# Patient Record
Sex: Female | Born: 1950 | Hispanic: Yes | Marital: Single | Smoking: Current every day smoker
Health system: Southern US, Community
[De-identification: ages and names within clinical notes are randomized; demographics above are authoritative.]

## PROBLEM LIST (undated history)

## (undated) ENCOUNTER — Ambulatory Visit (INDEPENDENT_AMBULATORY_CARE_PROVIDER_SITE_OTHER): Admission: RE | Payer: Self-pay | Admitting: Neurology

## (undated) ENCOUNTER — Ambulatory Visit (INDEPENDENT_AMBULATORY_CARE_PROVIDER_SITE_OTHER): Admission: RE | Payer: Self-pay

## (undated) DIAGNOSIS — R519 Headache, unspecified: Secondary | ICD-10-CM

## (undated) DIAGNOSIS — E785 Hyperlipidemia, unspecified: Secondary | ICD-10-CM

## (undated) DIAGNOSIS — R42 Dizziness and giddiness: Secondary | ICD-10-CM

## (undated) DIAGNOSIS — G629 Polyneuropathy, unspecified: Secondary | ICD-10-CM

## (undated) DIAGNOSIS — M545 Low back pain, unspecified: Secondary | ICD-10-CM

## (undated) DIAGNOSIS — R52 Pain, unspecified: Secondary | ICD-10-CM

## (undated) HISTORY — PX: CARPAL TUNNEL RELEASE: SHX101

## (undated) HISTORY — DX: Pain, unspecified: R52

## (undated) HISTORY — DX: Hyperlipidemia, unspecified: E78.5

## (undated) HISTORY — PX: APPENDECTOMY (OPEN): SHX54

## (undated) HISTORY — PX: FRACTURE SURGERY: SHX138

## (undated) HISTORY — PX: HYSTERECTOMY: SHX81

---
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
## 2002-07-24 ENCOUNTER — Ambulatory Visit
Admit: 2002-07-24 | Disposition: A | Discharge status: Home / Self Care | Payer: Self-pay | Source: Ambulatory Visit | Admitting: Neurological Surgery

## 2002-09-17 ENCOUNTER — Ambulatory Visit
Admit: 2002-09-17 | Disposition: A | Discharge status: Home / Self Care | Payer: Self-pay | Source: Ambulatory Visit | Admitting: Neurological Surgery

## 2003-04-26 HISTORY — PX: BACK SURGERY: SHX140

## 2003-05-23 ENCOUNTER — Ambulatory Visit
Admit: 2003-05-23 | Disposition: A | Discharge status: Home / Self Care | Payer: Self-pay | Source: Ambulatory Visit | Admitting: Specialist

## 2003-06-05 ENCOUNTER — Inpatient Hospital Stay
Admission: RE | Admit: 2003-06-05 | Disposition: A | Discharge status: Home / Self Care | Payer: Self-pay | Source: Ambulatory Visit | Admitting: Specialist

## 2003-09-10 ENCOUNTER — Ambulatory Visit
Admit: 2003-09-10 | Disposition: A | Discharge status: Home / Self Care | Payer: Self-pay | Source: Ambulatory Visit | Admitting: Physical Medicine & Rehabilitation

## 2003-09-22 ENCOUNTER — Emergency Department: Admit: 2003-09-22 | Payer: Self-pay | Source: Emergency Department | Admitting: Emergency Medicine

## 2004-01-16 ENCOUNTER — Ambulatory Visit
Admit: 2004-01-16 | Disposition: A | Discharge status: Home / Self Care | Payer: Self-pay | Source: Ambulatory Visit | Admitting: Physical Medicine & Rehabilitation

## 2004-03-11 ENCOUNTER — Ambulatory Visit
Admit: 2004-03-11 | Disposition: A | Discharge status: Home / Self Care | Payer: Self-pay | Source: Ambulatory Visit | Admitting: Infectious Disease

## 2005-02-10 ENCOUNTER — Ambulatory Visit: Admission: RE | Admit: 2005-02-10 | Payer: Self-pay | Source: Ambulatory Visit | Admitting: Orthopaedic Surgery

## 2009-08-18 ENCOUNTER — Emergency Department: Admit: 2009-08-18 | Payer: Self-pay | Source: Emergency Department | Admitting: Emergency Medicine

## 2010-05-20 ENCOUNTER — Ambulatory Visit: Admit: 2010-05-20 | Disposition: A | Discharge status: Home / Self Care | Payer: Self-pay | Source: Ambulatory Visit

## 2010-08-12 ENCOUNTER — Ambulatory Visit: Admit: 2010-08-12 | Discharge: 2010-08-12 | Payer: Self-pay | Source: Ambulatory Visit

## 2010-12-24 ENCOUNTER — Encounter (INDEPENDENT_AMBULATORY_CARE_PROVIDER_SITE_OTHER): Payer: Self-pay | Admitting: Neurology

## 2011-01-03 ENCOUNTER — Ambulatory Visit (INDEPENDENT_AMBULATORY_CARE_PROVIDER_SITE_OTHER): Payer: Medicare Other | Admitting: Neurology

## 2011-01-04 ENCOUNTER — Encounter (INDEPENDENT_AMBULATORY_CARE_PROVIDER_SITE_OTHER): Payer: Self-pay | Admitting: Neurology

## 2011-02-07 ENCOUNTER — Encounter (INDEPENDENT_AMBULATORY_CARE_PROVIDER_SITE_OTHER): Payer: Self-pay | Admitting: Neurology

## 2011-02-07 ENCOUNTER — Ambulatory Visit (INDEPENDENT_AMBULATORY_CARE_PROVIDER_SITE_OTHER): Payer: Medicare Other | Admitting: Neurology

## 2011-02-07 VITALS — BP 125/79 | HR 108 | Ht 61.0 in | Wt 90.2 lb

## 2011-02-07 DIAGNOSIS — M542 Cervicalgia: Secondary | ICD-10-CM

## 2011-02-07 DIAGNOSIS — R51 Headache: Secondary | ICD-10-CM

## 2011-02-07 DIAGNOSIS — R519 Headache, unspecified: Secondary | ICD-10-CM | POA: Insufficient documentation

## 2011-02-07 DIAGNOSIS — R413 Other amnesia: Secondary | ICD-10-CM

## 2011-02-07 DIAGNOSIS — Z8669 Personal history of other diseases of the nervous system and sense organs: Secondary | ICD-10-CM

## 2011-02-07 HISTORY — DX: Other amnesia: R41.3
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
## 2011-02-08 ENCOUNTER — Encounter (INDEPENDENT_AMBULATORY_CARE_PROVIDER_SITE_OTHER): Payer: Self-pay | Admitting: Neurology

## 2011-02-10 ENCOUNTER — Other Ambulatory Visit (INDEPENDENT_AMBULATORY_CARE_PROVIDER_SITE_OTHER): Payer: Self-pay | Admitting: Neurology

## 2011-02-11 LAB — LIPID PANEL, WITHOUT TOTAL CHOLESTEROL/HDL RATIO, SERUM
Cholesterol: 224 mg/dL — ABNORMAL HIGH (ref 100–199)
HDL: 66 mg/dL (ref >39)
LDL Calculated: 138 mg/dL — ABNORMAL HIGH (ref 0–99)
Triglycerides: 102 mg/dL (ref 0–149)
VLDL Calculated: 20 mg/dL (ref 5–40)

## 2011-04-25 ENCOUNTER — Ambulatory Visit
Admit: 2011-04-25 | Discharge: 2011-04-25 | Disposition: A | Discharge status: Home / Self Care | Payer: Self-pay | Source: Ambulatory Visit

## 2011-04-29 ENCOUNTER — Ambulatory Visit
Admit: 2011-04-29 | Discharge: 2011-04-29 | Disposition: A | Discharge status: Home / Self Care | Payer: Self-pay | Source: Ambulatory Visit

## 2011-09-22 ENCOUNTER — Encounter (INDEPENDENT_AMBULATORY_CARE_PROVIDER_SITE_OTHER): Payer: Self-pay | Admitting: Neurology

## 2011-09-22 ENCOUNTER — Ambulatory Visit (INDEPENDENT_AMBULATORY_CARE_PROVIDER_SITE_OTHER): Payer: Medicare Other | Admitting: Neurology

## 2011-09-22 VITALS — BP 144/79 | HR 87 | Wt 89.6 lb

## 2011-09-22 DIAGNOSIS — R51 Headache: Secondary | ICD-10-CM

## 2011-09-22 DIAGNOSIS — Z8669 Personal history of other diseases of the nervous system and sense organs: Secondary | ICD-10-CM
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
## 2011-10-06 ENCOUNTER — Ambulatory Visit (INDEPENDENT_AMBULATORY_CARE_PROVIDER_SITE_OTHER): Payer: Medicare Other | Admitting: Vascular Neurology

## 2011-10-06 ENCOUNTER — Encounter (INDEPENDENT_AMBULATORY_CARE_PROVIDER_SITE_OTHER): Payer: Self-pay | Admitting: Vascular Neurology

## 2011-10-06 VITALS — BP 106/68 | HR 72 | Ht 61.0 in | Wt 91.2 lb

## 2011-10-06 DIAGNOSIS — M542 Cervicalgia: Secondary | ICD-10-CM

## 2011-10-06 DIAGNOSIS — R51 Headache: Secondary | ICD-10-CM

## 2011-10-06 MED ORDER — GABAPENTIN 300 MG PO CAPS
ORAL_CAPSULE | ORAL | Status: DC
Start: 2011-10-06 — End: 2011-12-02
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
## 2011-12-02 ENCOUNTER — Ambulatory Visit (INDEPENDENT_AMBULATORY_CARE_PROVIDER_SITE_OTHER): Payer: Medicare Other | Admitting: Vascular Neurology

## 2011-12-02 ENCOUNTER — Encounter (INDEPENDENT_AMBULATORY_CARE_PROVIDER_SITE_OTHER): Payer: Self-pay | Admitting: Vascular Neurology

## 2011-12-02 VITALS — BP 99/65 | HR 77 | Ht 61.0 in | Wt 89.0 lb

## 2011-12-02 DIAGNOSIS — M542 Cervicalgia: Secondary | ICD-10-CM

## 2011-12-02 DIAGNOSIS — M25551 Pain in right hip: Secondary | ICD-10-CM | POA: Insufficient documentation

## 2011-12-02 DIAGNOSIS — M79609 Pain in unspecified limb: Secondary | ICD-10-CM

## 2011-12-02 DIAGNOSIS — R51 Headache: Secondary | ICD-10-CM

## 2011-12-02 DIAGNOSIS — M25559 Pain in unspecified hip: Secondary | ICD-10-CM

## 2011-12-02 DIAGNOSIS — M79604 Pain in right leg: Secondary | ICD-10-CM | POA: Insufficient documentation

## 2011-12-02 MED ORDER — GABAPENTIN 400 MG PO CAPS
400.00 mg | ORAL_CAPSULE | Freq: Three times a day (TID) | ORAL | Status: DC
Start: 2011-12-02 — End: 2012-03-05
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
## 2011-12-28 ENCOUNTER — Emergency Department: Payer: Medicare Other

## 2011-12-28 ENCOUNTER — Emergency Department
Admission: EM | Admit: 2011-12-28 | Discharge: 2011-12-28 | Disposition: A | Discharge status: Home / Self Care | Payer: Medicare Other | Attending: Emergency Medicine | Admitting: Emergency Medicine

## 2011-12-28 DIAGNOSIS — N12 Tubulo-interstitial nephritis, not specified as acute or chronic: Secondary | ICD-10-CM | POA: Insufficient documentation

## 2011-12-28 DIAGNOSIS — F17200 Nicotine dependence, unspecified, uncomplicated: Secondary | ICD-10-CM | POA: Insufficient documentation

## 2011-12-28 DIAGNOSIS — Z8612 Personal history of poliomyelitis: Secondary | ICD-10-CM | POA: Insufficient documentation

## 2011-12-28 DIAGNOSIS — Z7982 Long term (current) use of aspirin: Secondary | ICD-10-CM | POA: Insufficient documentation

## 2011-12-28 DIAGNOSIS — Z880 Allergy status to penicillin: Secondary | ICD-10-CM | POA: Insufficient documentation

## 2011-12-28 LAB — CBC AND DIFFERENTIAL
Basophils Absolute Automated: 0.04 10*3/uL (ref 0.00–0.20)
Basophils Automated: 0 % (ref 0–2)
Eosinophils Absolute Automated: 0.14 10*3/uL (ref 0.00–0.70)
Eosinophils Automated: 1 % (ref 0–5)
Hematocrit: 38.9 % (ref 37.0–47.0)
Hgb: 13 g/dL (ref 12.0–16.0)
Immature Granulocytes Absolute: 0.05 10*3/uL
Immature Granulocytes: 0 % (ref 0–1)
Lymphocytes Absolute Automated: 2.58 10*3/uL (ref 0.50–4.40)
Lymphocytes Automated: 15 % (ref 15–41)
MCH: 30.7 pg (ref 28.0–32.0)
MCHC: 33.4 g/dL (ref 32.0–36.0)
MCV: 92 fL (ref 80.0–100.0)
MPV: 9.6 fL (ref 9.4–12.3)
Monocytes Absolute Automated: 0.91 10*3/uL (ref 0.00–1.20)
Monocytes: 5 % (ref 0–11)
Neutrophils Absolute: 13.28 10*3/uL — ABNORMAL HIGH (ref 1.80–8.10)
Neutrophils: 78 % — ABNORMAL HIGH (ref 52–75)
Nucleated RBC: 0 /100 WBC (ref 0–1)
Platelets: 326 10*3/uL (ref 140–400)
RBC: 4.23 10*6/uL (ref 4.20–5.40)
RDW: 17 % — ABNORMAL HIGH (ref 12–15)
WBC: 17 10*3/uL — ABNORMAL HIGH (ref 3.50–10.80)

## 2011-12-28 LAB — URINALYSIS WITH MICROSCOPIC
Bilirubin, UA: NEGATIVE
Glucose, UA: NEGATIVE
Ketones UA: NEGATIVE
Nitrite, UA: NEGATIVE
Protein, UR: 100 — AB
Specific Gravity UA: 1.015 (ref 1.001–1.035)
Urine pH: 6 (ref 5.0–8.0)
Urobilinogen, UA: NORMAL mg/dL

## 2011-12-28 LAB — HEPATIC FUNCTION PANEL
ALT: 15 U/L (ref 0–55)
AST (SGOT): 31 U/L (ref 5–34)
Albumin/Globulin Ratio: 1 (ref 0.9–2.2)
Albumin: 3.9 g/dL (ref 3.5–5.0)
Alkaline Phosphatase: 72 U/L (ref 40–150)
Bilirubin Direct: 0.2 mg/dL (ref 0.0–0.5)
Bilirubin Indirect: 0.2 mg/dL (ref 0.0–1.1)
Bilirubin, Total: 0.4 mg/dL (ref 0.2–1.2)
Globulin: 4 g/dL — ABNORMAL HIGH (ref 2.0–3.6)
Protein, Total: 7.9 g/dL (ref 6.0–8.3)

## 2011-12-28 LAB — BASIC METABOLIC PANEL
BUN: 10 mg/dL (ref 7.0–19.0)
CO2: 23 mEq/L (ref 22–29)
Calcium: 9.1 mg/dL (ref 8.5–10.5)
Chloride: 103 mEq/L (ref 98–107)
Creatinine: 0.8 mg/dL (ref 0.6–1.0)
Glucose: 97 mg/dL (ref 70–100)
Potassium: 4.2 mEq/L (ref 3.5–5.1)
Sodium: 139 mEq/L (ref 136–145)

## 2011-12-28 LAB — GFR: EGFR: >60

## 2011-12-28 LAB — LIPASE: Lipase: 62 U/L (ref 8–78)

## 2011-12-28 MED ORDER — MORPHINE SULFATE 4 MG/ML IJ/IV SOLN (WRAP)
2.00 mg | Freq: Once | INTRAVENOUS | Status: AC
Start: 2011-12-28 — End: 2011-12-28
  Administered 2011-12-28: 2 mg via INTRAVENOUS
  Filled 2011-12-28: qty 1

## 2011-12-28 MED ORDER — MORPHINE SULFATE 4 MG/ML IJ/IV SOLN (WRAP)
4.00 mg | Freq: Once | INTRAVENOUS | Status: DC
Start: 2011-12-28 — End: 2011-12-28

## 2011-12-28 MED ORDER — SODIUM CHLORIDE 0.9 % IV BOLUS
1000.00 mL | Freq: Once | INTRAVENOUS | Status: AC
Start: 2011-12-28 — End: 2011-12-28
  Administered 2011-12-28: 1000 mL via INTRAVENOUS

## 2011-12-28 MED ORDER — CIPROFLOXACIN HCL 500 MG PO TABS
500.00 mg | ORAL_TABLET | Freq: Two times a day (BID) | ORAL | Status: AC
Start: 2011-12-28 — End: 2012-01-11

## 2011-12-28 MED ORDER — ONDANSETRON 4 MG PO TBDP
4.00 mg | ORAL_TABLET | Freq: Four times a day (QID) | ORAL | Status: AC | PRN
Start: 2011-12-28 — End: 2012-01-04

## 2011-12-28 MED ORDER — KETOROLAC TROMETHAMINE 30 MG/ML IJ SOLN
30.00 mg | Freq: Once | INTRAMUSCULAR | Status: AC
Start: 2011-12-28 — End: 2011-12-28
  Administered 2011-12-28: 30 mg via INTRAVENOUS
  Filled 2011-12-28: qty 1

## 2011-12-28 MED ORDER — CIPROFLOXACIN IN D5W 400 MG/200ML IV SOLN
400.00 mg | Freq: Once | INTRAVENOUS | Status: AC
Start: 2011-12-28 — End: 2011-12-28
  Administered 2011-12-28: 400 mg via INTRAVENOUS
  Filled 2011-12-28: qty 200

## 2011-12-28 NOTE — ED Notes (Signed)
Family at bedside. Additional blankets provided.

## 2011-12-28 NOTE — Discharge Instructions (Signed)
Take antibiotics as directed. You need to find a primary care doctor this week to follow up with. We have listed the [place] [facility] group for you to go to, or you may call the referral line to find a doctor closer to your home. Drink lots and lots of clear fluids. Return if you have any new or worsening symptoms including fevers, vomiting, severe abdominal pain, chest pain, or other concerns.    Pyelonephritis    You have been diagnosed with pyelonephritis, also known as a kidney infection.    Pyelonephritis is a bacterial infection in your kidneys. Symptoms include fever, chills, nausea and vomiting, back pain on one or both sides, and sometimes difficulty urinating. You may also feel very weak. The diagnosis is made based on your symptoms and a test of your urine.    Pyelonephritis most commonly occurs when a lower urinary tract infection (bladder infection) ascends (climbs up) the urinary tract and gets into the kidney. Early treatment of a bladder infection is important to prevent pyelonephritis and the possibility of kidney damage.    Pyelonephritis is treated with antibiotics, pain and fever medications, and fluids. Some patients require an "IV" if they have nausea or vomiting and cannot keep down the antibiotics, or if they aren't improving after 2-3 days of oral (by mouth) medications. Most patients do not need to be admitted to the hospital for pyelonephritis. A few patients who don't do well with the oral (by mouth) antibiotics or become sicker despite treatment may need to return to be admitted to the hospital.    YOU SHOULD SEEK MEDICAL ATTENTION IMMEDIATELY, EITHER HERE OR AT THE NEAREST EMERGENCY DEPARTMENT, IF ANY OF THE FOLLOWING OCCURS:   Failure to improve after 2-3 days of antibiotics.   You develop nausea or vomiting and are unable to keep down medications or fluids.   Increased weakness or lightheadedness.   You develop any progressive or worsening symptoms or any other  concerns.    [facility] Referral    [facility] Referral     You have been referred to the [facility] for your follow-up care. Please contact them for assistance at [phone]-DOCS or [phone].  You can also find them online at:  [email or web address]    [facility] Referral Line    You have been referred to a primary care doctor or a specialist for follow-up care. Please call the [place] referral line and they will be able to help you find a physician in your area that you can follow up with.    Phone: [phone]-IMG-DOCS or [phone]

## 2011-12-28 NOTE — ED Notes (Signed)
Pt with sudden onset of lower abdominal pain/Right flank pain which started 2 hours ago. Also reports hematuria with clots. Feels lightheaded, +chills, denies nausea/vomiting.

## 2011-12-28 NOTE — ED Notes (Signed)
MD at bedside. 
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
## 2012-01-29 ENCOUNTER — Emergency Department: Payer: Medicare Other

## 2012-01-29 ENCOUNTER — Emergency Department
Admission: EM | Admit: 2012-01-29 | Discharge: 2012-01-29 | Disposition: A | Discharge status: Home / Self Care | Payer: Medicare Other | Attending: Emergency Medicine | Admitting: Emergency Medicine

## 2012-01-29 DIAGNOSIS — Z880 Allergy status to penicillin: Secondary | ICD-10-CM | POA: Insufficient documentation

## 2012-01-29 DIAGNOSIS — F17200 Nicotine dependence, unspecified, uncomplicated: Secondary | ICD-10-CM | POA: Insufficient documentation

## 2012-01-29 DIAGNOSIS — N12 Tubulo-interstitial nephritis, not specified as acute or chronic: Secondary | ICD-10-CM | POA: Insufficient documentation

## 2012-01-29 LAB — CBC AND DIFFERENTIAL
Basophils Absolute Automated: 0.04 10*3/uL (ref 0.00–0.20)
Basophils Automated: 0 % (ref 0–2)
Eosinophils Absolute Automated: 0.15 10*3/uL (ref 0.00–0.70)
Eosinophils Automated: 1 % (ref 0–5)
Hematocrit: 39.4 % (ref 37.0–47.0)
Hgb: 13.4 g/dL (ref 12.0–16.0)
Immature Granulocytes Absolute: 0.04 10*3/uL
Immature Granulocytes: 0 % (ref 0–1)
Lymphocytes Absolute Automated: 4.45 10*3/uL — ABNORMAL HIGH (ref 0.50–4.40)
Lymphocytes Automated: 31 % (ref 15–41)
MCH: 31.5 pg (ref 28.0–32.0)
MCHC: 34 g/dL (ref 32.0–36.0)
MCV: 92.7 fL (ref 80.0–100.0)
MPV: 9.6 fL (ref 9.4–12.3)
Monocytes Absolute Automated: 1.27 10*3/uL — ABNORMAL HIGH (ref 0.00–1.20)
Monocytes: 9 % (ref 0–11)
Neutrophils Absolute: 8.55 10*3/uL — ABNORMAL HIGH (ref 1.80–8.10)
Neutrophils: 59 % (ref 52–75)
Nucleated RBC: 0 /100 WBC (ref 0–1)
Platelets: 280 10*3/uL (ref 140–400)
RBC: 4.25 10*6/uL (ref 4.20–5.40)
RDW: 16 % — ABNORMAL HIGH (ref 12–15)
WBC: 14.5 10*3/uL — ABNORMAL HIGH (ref 3.50–10.80)

## 2012-01-29 LAB — I-STAT CG4 VENOUS CARTRIDGE
Lactic Acid I-Stat: 0.4 mEq/L — ABNORMAL LOW (ref 0.5–2.2)
i-STAT Base Excess Venous: 2 mEq/L
i-STAT FIO2: 21
i-STAT HCO3 Bicarbonate Venous: 27.7 mEq/L
i-STAT O2 Saturation Venous: 19 %
i-STAT Patient Temperature: 98.9
i-STAT Total CO2 Venous: 29 mEq/L
i-STAT pCO2 Venous: 47.6 mmHg
i-STAT pH Venous: 7.373
i-STAT pO2 Venous: 15 mmHg

## 2012-01-29 LAB — URINALYSIS WITH MICROSCOPIC
Bilirubin, UA: NEGATIVE
Blood, UA: NEGATIVE
Glucose, UA: NEGATIVE
Ketones UA: NEGATIVE
Leukocyte Esterase, UA: NEGATIVE
Nitrite, UA: NEGATIVE
Protein, UR: NEGATIVE
Specific Gravity UA: 1.006 (ref 1.001–1.035)
Urine pH: 6.5 (ref 5.0–8.0)
Urobilinogen, UA: NORMAL mg/dL

## 2012-01-29 LAB — BASIC METABOLIC PANEL
BUN: 9 mg/dL (ref 7.0–19.0)
CO2: 24 mEq/L (ref 22–29)
Calcium: 9.3 mg/dL (ref 8.5–10.5)
Chloride: 103 mEq/L (ref 98–107)
Creatinine: 0.8 mg/dL (ref 0.6–1.0)
Glucose: 106 mg/dL — ABNORMAL HIGH (ref 70–100)
Potassium: 4.3 mEq/L (ref 3.5–5.1)
Sodium: 137 mEq/L (ref 136–145)

## 2012-01-29 LAB — GFR: EGFR: >60

## 2012-01-29 MED ORDER — SODIUM CHLORIDE 0.9 % IV BOLUS
1000.00 mL | Freq: Once | INTRAVENOUS | Status: AC
Start: 2012-01-29 — End: 2012-01-29
  Administered 2012-01-29: 1000 mL via INTRAVENOUS

## 2012-01-29 MED ORDER — MORPHINE SULFATE 4 MG/ML IJ/IV SOLN (WRAP)
4.00 mg | Freq: Once | INTRAVENOUS | Status: AC
Start: 2012-01-29 — End: 2012-01-29
  Administered 2012-01-29: 4 mg via INTRAVENOUS
  Filled 2012-01-29: qty 1

## 2012-01-29 MED ORDER — CEFTRIAXONE SODIUM 1 G IJ SOLR
1.00 g | Freq: Once | INTRAMUSCULAR | Status: AC
Start: 2012-01-29 — End: 2012-01-29
  Administered 2012-01-29: 1 g via INTRAVENOUS
  Filled 2012-01-29: qty 1000

## 2012-01-29 MED ORDER — ONDANSETRON HCL 4 MG/2ML IJ SOLN
4.00 mg | Freq: Once | INTRAMUSCULAR | Status: AC
Start: 2012-01-29 — End: 2012-01-29
  Administered 2012-01-29: 4 mg via INTRAVENOUS
  Filled 2012-01-29: qty 2

## 2012-01-29 MED ORDER — CEFDINIR 300 MG PO CAPS
300.00 mg | ORAL_CAPSULE | Freq: Two times a day (BID) | ORAL | Status: AC
Start: 2012-01-29 — End: 2012-02-08

## 2012-01-29 MED ORDER — HYDROCODONE-ACETAMINOPHEN 5-325 MG PO TABS
1.00 | ORAL_TABLET | Freq: Four times a day (QID) | ORAL | Status: AC | PRN
Start: 2012-01-29 — End: 2012-02-08

## 2012-01-29 NOTE — Discharge Instructions (Signed)
Pyelonephritis    You have been diagnosed with pyelonephritis, also known as a kidney infection.    Pyelonephritis is a bacterial infection in your kidneys. Symptoms include fever, chills, nausea and vomiting, back pain on one or both sides, and sometimes difficulty urinating. You may also feel very weak. The diagnosis is made based on your symptoms and a test of your urine.    Pyelonephritis most commonly occurs when a lower urinary tract infection (bladder infection) ascends (climbs up) the urinary tract and gets into the kidney. Early treatment of a bladder infection is important to prevent pyelonephritis and the possibility of kidney damage.    Pyelonephritis is treated with antibiotics, pain and fever medications, and fluids. Some patients require an "IV" if they have nausea or vomiting and cannot keep down the antibiotics, or if they aren't improving after 2-3 days of oral (by mouth) medications. Most patients do not need to be admitted to the hospital for pyelonephritis. A few patients who don't do well with the oral (by mouth) antibiotics or become sicker despite treatment may need to return to be admitted to the hospital.    YOU SHOULD SEEK MEDICAL ATTENTION IMMEDIATELY, EITHER HERE OR AT THE NEAREST EMERGENCY DEPARTMENT, IF ANY OF THE FOLLOWING OCCURS:   Failure to improve after 2-3 days of antibiotics.   You develop nausea or vomiting and are unable to keep down medications or fluids.   Increased weakness or lightheadedness.   You develop any progressive or worsening symptoms or any other concerns.      [facility] Referral Line    You have been referred to a primary care doctor or a specialist for follow-up care. Please call the [place] referral line and they will be able to help you find a physician in your area that you can follow up with.    Phone: [phone]-IMG-DOCS or [phone]

## 2012-01-29 NOTE — ED Provider Notes (Signed)
Physician/Midlevel provider first contact with patient: 01/29/12 2035             EMERGENCY DEPARTMENT HISTORY AND PHYSICAL EXAM    Date: 01/29/2012  Patient Name: MIKAEL, DEBELL      Disposition and Treatment Plan    Clinical Impression: No diagnosis found.  Disposition: Discharge to home       History of Presenting Illness     Chief Complaint   Patient presents with   . Back Pain     Context: home  Location: back  Duration: 2 days  Quality:   Timing: persistent  Maximum Severity: moderate  Modifying Factors: worse with movement and urination  History Obtained From: patient  Additional History: TYSHAWN KEEL is a 61 y.o. female with a PMHx of appendectomy, complete hysterectomy, hypercholesteremia and borderline long QT p/w lower back pain radiating to bilateral flank and suprapubic abd pain a/w dysuria x 2 days. Pt was seen here on 12/28/11, dx'ed with pyelonephritis, and rx'ed 14 day cirpo course. Pt f/u with her OB on 09/19 (has no PCP) who rx'ed macrobid for 5 days which she started on the 09/19. Pain is worsened with movement. +abd distention. Pain worsened with movement. Lower back pain radiating to bilateral flanks and suprapubic abd. Normals BMs. +Warm (lower back/flanks) +Chills. +Abd distention. +Frequency. +Urgency.-hematuria, Denies cough, cold sxs, hematuria, and any further complaints.    PCP: Pcp, Noneorunknown, MD  OB: Ralene Bathe, MD    Current facility-administered medications:morphine injection 4 mg, Active, 4 mg, Intravenous, Once, Ahnesty Finfrock, PA;  ondansetron (ZOFRAN) injection 4 mg, Active, 4 mg, Intravenous, Once, Hollin Crewe, PA  Current outpatient prescriptions:aspirin 81 MG tablet, Active, Take 81 mg by mouth daily.  , Disp: , Rfl: ;  butalbital-acetaminophen-caffeine (FIORICET, ESGIC) 50-325-40 MG per tablet, Active, Take 1 tablet by mouth every 4 (four) hours as needed., Disp: , Rfl: ;  fish oil-omega-3 fatty acids 1000 MG capsule, Active, Take 1 g by mouth daily.  , Disp: ,  Rfl:   gabapentin (NEURONTIN) 400 MG capsule, Active, Take 1 capsule (400 mg total) by mouth 3 (three) times daily., Disp: 90 capsule, Rfl: 3;  Raloxifene HCl (EVISTA PO), Active, Take 120 mg by mouth daily.  , Disp: , Rfl:     Past Medical History     Past Medical History   Diagnosis Date   . Meningitis spinal    . Polio      Past Surgical History   Procedure Date   . Hysterectomy    . Appendectomy    . Carpal tunnel release    . Back surgery    . Fracture surgery        Family History     History reviewed. No pertinent family history.    Social History     History     Social History   . Marital Status: Single     Spouse Name: N/A     Number of Children: N/A   . Years of Education: N/A     Social History Main Topics   . Smoking status: Current Everyday Smoker -- 1.0 packs/day     Types: Cigarettes   . Smokeless tobacco: Not on file   . Alcohol Use: No   . Drug Use: No   . Sexually Active: Not on file     Other Topics Concern   . Not on file     Social History Narrative   . No narrative on file  Allergies     Allergies   Allergen Reactions   . Gadolinium    . Iodine    . Penicillins        Review of Systems     Positive and negative ROS elements as per HPI.  All Other Systems Reviewed and Negative: Yes    Physical Exam   BP 94/57  Pulse 82  Temp(Src) 98.9 F (37.2 C) (Oral)  Resp 16  SpO2 96%  Constitutional: Vital signs reviewed.   Head: Normocephalic, atraumatic  Eyes: No conjunctival injection. No discharge. EOM are normal.   ENT: Mucous membranes moist  Neck: Normal range of motion. Trachea midline. Neck supple.   Respiratory/Chest: clear to auscultation. No wheezes, rales or rhonchi. Good air movement.  No dyspnea or work of breathing.  Cardiovascular: Regular rate and rhythm. No murmur.  Abdomen: tenderness in the diffuse suprapubic tenderness, no guarding, b/l cva tenderness. ..  Upper Extremities:No edema or cyanosis  Lower Extremities:No edema or cyanosis  Musculoskeletal: Normal range of motion.     Neurological: Alert Normal and symmetric motor tone by observation. Speech normal. Memory Normal. No facial assymetry.  Skin:  Warm and dry. No rashes noted.   Psychiatric: Normal affect and normal concentration for age    Diagnostic Study Results   Labs -     Results     ** No Results found for the last 24 hours. **        Radiologic Studies -   Radiology Results (24 Hour)     ** No Results found for the last 24 hours. **      .    EKG Interpretation: no ekg    Clinical Course in the Emergency Department         Medical Decision Making     I am the first provider for this patient.  I reviewed the vital signs, nursing notes, past medical history, past surgical history, family history and social history.      Vital Signs -   Patient Vitals for the past 12 hrs:   BP Temp Pulse Resp   01/29/12 1815 94/57 mmHg 98.9 F (37.2 C) 82  16        Pulse Oximetry Analysis - Normal    Differential Diagnosis (not completely inclusive): pyelonephritis, back strain, cystitis     Laboratory results reviewed by EDP: yes  Radiologic study results reviewed by EDP: N\A  Radiologic Studies Interpreted (viewed) by EDP: N\A        _______________________________  ______________________________      First MD: No att. providers found    I am scribing for Edmonia Caprio, PA and No att. providers found on [facility] V  Treatment Team: Scribe: Pervis Hocking (ED Scribe)  8:14 PM  01/29/2012    I am the first provider for this patient and I personally performed the services documented.  Treatment Team: Scribe: Pervis Hocking is scribing for me on [facility] V. I reviewed and confirm the accuracy of the information in this medical record.  Edmonia Caprio, PA and No att. providers found   8:14 PM  01/29/2012                 Edmonia Caprio, [place]  01/29/12 2140

## 2012-01-29 NOTE — ED Notes (Signed)
BP improved, PA notified. Instructed to give morphine/zofran at this time.

## 2012-01-29 NOTE — ED Notes (Signed)
istat lactate 0.42. PA notified.

## 2012-01-29 NOTE — ED Notes (Signed)
PA notified of pt BP 92/52. NS running. Holding morhpine/zofran at this time.

## 2012-01-29 NOTE — ED Notes (Signed)
Pt discharged to home by PA with no assistance from this RN.

## 2012-01-29 NOTE — ED Notes (Signed)
Pt states back pain x 2 days with "abnormal urine". Pt states notices specs of things in her urine. Pt unable to see physician until Thursday. Pain to back and pelvis.

## 2012-01-31 ENCOUNTER — Ambulatory Visit (INDEPENDENT_AMBULATORY_CARE_PROVIDER_SITE_OTHER): Payer: Medicare Other | Admitting: Vascular Neurology

## 2012-03-05 ENCOUNTER — Ambulatory Visit (INDEPENDENT_AMBULATORY_CARE_PROVIDER_SITE_OTHER): Payer: Medicare Other | Admitting: Vascular Neurology

## 2012-03-05 VITALS — BP 117/73 | HR 72 | Ht 61.3 in | Wt 93.4 lb

## 2012-03-05 DIAGNOSIS — M79609 Pain in unspecified limb: Secondary | ICD-10-CM

## 2012-03-05 DIAGNOSIS — R51 Headache: Secondary | ICD-10-CM

## 2012-03-05 DIAGNOSIS — M79604 Pain in right leg: Secondary | ICD-10-CM

## 2012-03-05 DIAGNOSIS — M25559 Pain in unspecified hip: Secondary | ICD-10-CM

## 2012-03-05 DIAGNOSIS — M25551 Pain in right hip: Secondary | ICD-10-CM

## 2012-03-05 DIAGNOSIS — M542 Cervicalgia: Secondary | ICD-10-CM

## 2012-03-05 MED ORDER — GABAPENTIN 400 MG PO CAPS
400.00 mg | ORAL_CAPSULE | Freq: Three times a day (TID) | ORAL | Status: DC
Start: 2012-03-05 — End: 2012-06-14

## 2012-03-05 NOTE — Progress Notes (Signed)
Subjective:      Patient ID: Ann Patterson is a 61 y.o. female.    HPI  The patient tells me that her headaches are much better.  She is using  gabapentin 400 mg t.i.d. and this seems to be controlling her headaches as  well as muscle spasms.  Her neck is sore but better.  Still has right hip  and leg pain and paresthesias which is about the same.  She reports  occasional tingling and a falling asleep sensation in the right leg as well  as right hip pain when sitting down for too long.       She saw orthopedics since I last saw her, and further evaluation is  pending.  She also tells me she has been hospitalized twice since I last  saw her, at least once with a UTI.          Review of Systems    Constitutional: Negative for fever.   Respiratory: Negative for shortness of breath.    Cardiovascular: Negative for chest pain.   Gastrointestinal: Negative for abdominal pain.   Neurological: Negative for dizziness, weakness, numbness and headaches.   Psychiatric/Behavioral: Negative for sleep disturbance.   All other systems reviewed and are negative.    Objective:   Neurologic Exam  Mental Status: The patient was awake, alert, appeared oriented and was appropriate. Speech was fluent and the patient followed commands well. Attention, concentration, and memory appeared intact. Fund of knowledge appeared appropriate for education level.   Cranial Nerves: Pupils were equally round and reactive to light bilaterally. Extraocular movements were intact without nystagmus. Hearing was intact to conversational speech. Face was symmetric. Tongue appeared midline.   Motor: Good/full strength throughout without clear focality or weakness. Bulk appeared normal for body habitus. No obvious tremor noted.   Sensation: light touch was grossly intact.   Coordination: Finger to nose testing was intact without dysmetria.   Gait: Normal and steady.        Assessment:     1. Headache    2. Neck pain    3. Right hip pain    4. Right leg pain           Plan:     1.  Continue with gabapentin 400 mg t.i.d.  2.  Follow up with orthopedics as planned.  3.  Consider nerve conduction testing in the right lower extremity if he  continues to have symptoms, pending orthopedic evaluation.  4.  Follow up with me in about 3 months or sooner if needed.    The patient will return for follow-up as outlined above. If there are any problems with medications (if prescribed) or questions about any available test results, or if there are any new symptoms or changes in current symptoms, the patient is instructed to call the office.     The patient may have filled out a questionnaire as part of the office visit and that information would be scanned into the chart and become part of the office visit. Notes from Dr. Stacy Gardner may also be scanned in and become part of the office visit.     More than 50% of the office visit was spent counseling the patient on one or more of the following:   -Disease state - discussion about the medical/neurological condition, diagnostic and treatment options;  -Medications - indications and side effects   -Life style issues such as diet, exercise, tobacco and alcohol use, sleep, stress, depression and anxiety   -  Primary and secondary stroke prevention, if applicable.    Gaytha Raybourn B. Stacy Gardner, MD  Board Certified, Neurology  Board Certified, Clinical Neurophysiology  Board Certified, Electrodiagnostic Medicine  Board Certified, Vascular Neurology

## 2012-03-06 NOTE — ED Notes (Signed)
Ann Median, MD Attending Note:   The patient was seen and examined by the mid-level (physician's assistant or nurse practitioner), or fellow, and the plan of care was discussed with me by Yunus. My history and physical exam confirms:  Impression: 61 yo F with 2 days flank pain and back pain seen and treated with 14 day course of Cipro with resolution, but recurrent sx beginning 9/19 rx with macrobid.  Sx have not improved on macrobid but rather worsened assoc urgency and frequency  BP noted s/w low will address with IVF  RRR CTA Soft + CVAT  WDx/DDx: Suspect recurrent Upper tract UTI - Pyelo  Plan of Care: culture, labs abx IV follwed by po provided no other indications for inpt rx found      Lyn Henri, MD  03/06/12 1329

## 2012-04-26 NOTE — Op Note (Unsigned)
DATE OF BIRTH:                        07-14-1950      ADMISSION DATE:                     06/05/2003            PATIENT LOCATION:                    [facility] 06/06/2003            DATE OF PROCEDURE:                   06/05/2003      SURGEON:                            Drue Stager, MD      ASSISTANT(S):                         Bryson Dames, MD                  PREOPERATIVE DIAGNOSES      1.   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      2.   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            POSTOPERATIVE DIAGNOSES      1.   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      2.   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            PROCEDURES      1.   C4-5 ANTERIOR DISKECTOMY.      2.   C5-6 ANTERIOR DISKECTOMY.      3.   C4-5 ARTHRODESIS.      4.   C5-6 ARTHRODESIS.      5.   IMPLANTATION OF BIOMECHANICAL DEVICE C4-5.      6.   IMPLANTATION OF BIOMECHANICAL DEVICE C5-6.      7.   C4 THROUGH C6 ANTERIOR INSTRUMENTATION.            ANESTHESIA:  General.            INDICATIONS:  This is a 62 year old right-handed woman who has developed      neck and bilateral arm pain and the pain is associated with right greater      than left-sided tingling and numbness from the third through fifth fingers.      She tends to drop items all the time and often needs to put things down.      MRI scan shows degenerative changes with osteophyte formation at C4-5 and      C5-6 with foraminal narrowing bilaterally.  She is now taken for operative      decompression.            DESCRIPTION OF PROCEDURE:  Following informed consent, the patient was      brought to the operating room and placed under general endotracheal      anesthesia.  Intravenous and intra-arterial access lines as well as a Foley      catheter were placed.  She was positioned in a supine position and the      planned incision was marked in the neck.  It was then infiltrated with 1%      lidocaine with epinephrine.  She was then prepped and draped in  the usual      sterile fashion.            Surgery was begun by  using a #10 blade to make an incision through the      neck.  The skin and soft tissues were cut and a subcutaneous layer      developed above the platysma.  The platysma muscle was then cut and a      subplatysmal plane developed.  The medial border of the sternocleidomastoid      was developed.  I proceeded a plane medial to the carotid and lateral to      the trachea and esophagus until the anterior prevertebral space was      entered.  Cross-table lateral confirmed our localization at C4-5.      Therefore, the longus colli muscles were elevated from C4 down to C6 and      the Trimline retractor was placed.            First, attention was turned to C4-5.  At this level, the disk was removed      using a combination of curettes and the pituitary.  The disk space was      widened in order to decompression the bone and the foramen.  The posterior      osteophytes as well as the foramen were opened using the Kerrison      bilaterally.            Next, attention was turned to the C5-6 space.  Again, the #15 blade was      used to remove the anterior disk.  The disk was then removed using a      combination of the curettes and the pituitary.  The posterior osteophytes      were then removed once they were thinned with the Midas.  The Kerrison was      used to open up the foramen bilaterally.  At both spaces, the spaces were      prepared with a Smith-Robinson type graft.            Once decompression was complete, the grafts were prepared.  The depth was      measured to be 17.  The Cornerstone grafts were brought into the field at      C4-5.  A 7-mm Cornerstone graft was placed and gently tapped into place.      At C5-6, a 7-mm Cornerstone graft was also brought and gently tapped into      place.  A blunt nerve hook was passed posteriorly at all 2 levels to make      sure that there was no thecal sac impingement.            The Zephir plating system was then brought into the field.  A Zephir plate      measuring 40-mm  was passed from C4 down to C6.  Two 13-mm variable screws      were placed in C4 and 2 were placed into C6.  A central screw was placed      into C5 and the plate was tightened down.  The wound was copiously      irrigated with bacitracin irrigation.  Hemostasis was obtained with bipolar      electrocautery and then the retractors were removed.  The platysma was      reapproximated closed with 3-0 Vicryl suture.  Deep dermal layer was closed  with 3-0 Vicryl suture and the skin was closed with 4-0 Vicryl suture.            ESTIMATED BLOOD LOSS:  100 mL.            COMPLICATIONS:  None.                                    ___________________________________          Date Signed: __________      Drue Stager, MD  ([id number])            D: 06/11/2003 by Drue Stager, MD      T: 06/12/2003 by [id number] ([id number]) ([id number])      cc:  Drue Stager, MD

## 2012-04-27 NOTE — Op Note (Unsigned)
PATIENTADREONNA, Ann Patterson      MEDICAL RECORD NUMBER:         [id number]      PATIENT LOCATION/SERVICE:       [id number]            DATE OF SURGERY:               02/10/2005      SURGEON:                       Abran Cantor, MD      ASSISTANT 1:            ANESTHESIA:   General.            PREOPERATIVE DIAGNOSIS:  Trigger thumb, right hand; carpal tunnel, right      wrist.            POSTOPERATIVE DIAGNOSIS:  Trigger thumb, right hand; carpal tunnel, right      wrist.            OPERATION PERFORMED:  Release right carpal tunnel and release of right      trigger thumb.            PROCEDURE IN DETAIL:  Under adequate general anesthesia, the patient was      prepped and draped in the usual manner.  The arm was exsanguinated with an      Esmarch bandage and the tourniquet elevated to 250 mmHg pressure.  The      incisions were marked out in the thumb crease at the MP joint on the volar      aspect and over the carpal tunnel.  The curvilinear incision over the      carpal tunnel was opened first, down to the skin and into the subcutaneous      connective tissue.  The subcutaneous connective tissue was dissected      bluntly.  The carpal canal was opened and a Therapist, nutritional was inserted      beneath the transverse carpal ligament.  The ligament was then opened      distally down to the proximal carpal arch and this was a wide enough gap to      allow insertion of a finger.  Proximally the fascia was divided up for      about 3 cm above the wrist.  The wound was irrigated with normal saline and      all bleeding points were controlled with electrocautery.            Next, the incision was made in the palmar crease over the MP joint of the      thumb and taken into the subcutaneous connective tissue with blunt      dissection.  The soft tissue was swept off of the flexor tendon sheath and      tendon sheath was identified and opened with scissors.  The incision was      then extended proximally and  distally with the scissors, allowing complete      release of the flexor tendon sheath.  This was irrigated with normal saline      as well and the wound closed with interrupted sutures of 4-0 nylon.  The      carpal tunnel incision was closed with interrupted simple sutures on the  proximal portion and a running suture in the distal portion of 5-0 nylon.      Both incisions were injected with Marcaine 0.5% plain.            The wounds were dressed with Xeroform, 4 x 4's and a bulky hand dressing      applied.  The tourniquet was deflated and good distal return was seen.  The      patient tolerated the procedure well and went to the recovery room in      satisfactory condition.                                          ___________________________________     Date Signed: _______________      Abran Cantor, MD                  E [id number]      D:    02/10/2005      T:    02/10/2005      #:    [id number]      N:    [id number]            cc:   Abran Cantor, MD

## 2012-05-02 ENCOUNTER — Emergency Department: Payer: Medicare Other

## 2012-05-02 ENCOUNTER — Emergency Department
Admission: EM | Admit: 2012-05-02 | Discharge: 2012-05-03 | Disposition: A | Discharge status: Home / Self Care | Payer: Medicare Other | Attending: Emergency Medicine | Admitting: Emergency Medicine

## 2012-05-02 DIAGNOSIS — R509 Fever, unspecified: Secondary | ICD-10-CM | POA: Insufficient documentation

## 2012-05-02 DIAGNOSIS — R059 Cough, unspecified: Secondary | ICD-10-CM | POA: Insufficient documentation

## 2012-05-02 DIAGNOSIS — R109 Unspecified abdominal pain: Secondary | ICD-10-CM | POA: Insufficient documentation

## 2012-05-02 DIAGNOSIS — F17200 Nicotine dependence, unspecified, uncomplicated: Secondary | ICD-10-CM | POA: Insufficient documentation

## 2012-05-02 LAB — COMPREHENSIVE METABOLIC PANEL
ALT: 7 U/L (ref 0–55)
AST (SGOT): 23 U/L (ref 5–34)
Albumin/Globulin Ratio: 1 (ref 0.9–2.2)
Albumin: 3.3 g/dL — ABNORMAL LOW (ref 3.5–5.0)
Alkaline Phosphatase: 64 U/L (ref 40–150)
BUN: 7 mg/dL (ref 7.0–19.0)
Bilirubin, Total: 0.7 mg/dL (ref 0.2–1.2)
CO2: 25 mEq/L (ref 22–29)
Calcium: 8.8 mg/dL (ref 8.5–10.5)
Chloride: 102 mEq/L (ref 98–107)
Creatinine: 0.9 mg/dL (ref 0.6–1.0)
Globulin: 3.4 g/dL (ref 2.0–3.6)
Glucose: 123 mg/dL — ABNORMAL HIGH (ref 70–100)
Potassium: 3.7 mEq/L (ref 3.5–5.1)
Protein, Total: 6.7 g/dL (ref 6.0–8.3)
Sodium: 137 mEq/L (ref 136–145)

## 2012-05-02 LAB — URINALYSIS WITH MICROSCOPIC
Bilirubin, UA: NEGATIVE
Glucose, UA: NEGATIVE
Ketones UA: NEGATIVE
Leukocyte Esterase, UA: NEGATIVE
Nitrite, UA: NEGATIVE
Protein, UR: NEGATIVE
Specific Gravity UA: 1.008 (ref 1.001–1.035)
Urine pH: 6 (ref 5.0–8.0)
Urobilinogen, UA: NORMAL mg/dL

## 2012-05-02 LAB — CBC AND DIFFERENTIAL
Basophils Absolute Automated: 0.03 10*3/uL (ref 0.00–0.20)
Basophils Automated: 0 %
Eosinophils Absolute Automated: 0.01 10*3/uL (ref 0.00–0.70)
Eosinophils Automated: 0 %
Hematocrit: 35.4 % — ABNORMAL LOW (ref 37.0–47.0)
Hgb: 11.8 g/dL — ABNORMAL LOW (ref 12.0–16.0)
Immature Granulocytes Absolute: 0.05 10*3/uL
Immature Granulocytes: 0 %
Lymphocytes Absolute Automated: 2.09 10*3/uL (ref 0.50–4.40)
Lymphocytes Automated: 14 %
MCH: 32 pg (ref 28.0–32.0)
MCHC: 33.3 g/dL (ref 32.0–36.0)
MCV: 95.9 fL (ref 80.0–100.0)
MPV: 10 fL (ref 9.4–12.3)
Monocytes Absolute Automated: 1.23 10*3/uL — ABNORMAL HIGH (ref 0.00–1.20)
Monocytes: 8 %
Neutrophils Absolute: 11.52 10*3/uL — ABNORMAL HIGH (ref 1.80–8.10)
Neutrophils: 77 %
Nucleated RBC: 0 /100 WBC (ref 0–1)
Platelets: 222 10*3/uL (ref 140–400)
RBC: 3.69 10*6/uL — ABNORMAL LOW (ref 4.20–5.40)
RDW: 15 % (ref 12–15)
WBC: 14.93 10*3/uL — ABNORMAL HIGH (ref 3.50–10.80)

## 2012-05-02 LAB — I-STAT CG4 VENOUS CARTRIDGE
Lactic Acid I-Stat: 1.2 mEq/L (ref 0.5–2.2)
i-STAT Base Excess Venous: 1 mEq/L
i-STAT FIO2: 21
i-STAT HCO3 Bicarbonate Venous: 25.3 mEq/L
i-STAT O2 Saturation Venous: 64 %
i-STAT Patient Temperature: 101
i-STAT Total CO2 Venous: 26 mEq/L
i-STAT pCO2 Venous: 41.6 mmHg
i-STAT pH Venous: 7.398
i-STAT pO2 Venous: 36 mmHg

## 2012-05-02 LAB — LIPASE: Lipase: 31 U/L (ref 8–78)

## 2012-05-02 LAB — GFR: EGFR: >60

## 2012-05-02 MED ORDER — LEVOFLOXACIN 750 MG PO TABS
750.0000 mg | ORAL_TABLET | Freq: Once | ORAL | Status: AC
Start: 2012-05-03 — End: 2012-05-03

## 2012-05-02 MED ORDER — ONDANSETRON HCL 4 MG/2ML IJ SOLN
4.00 mg | Freq: Once | INTRAMUSCULAR | Status: AC
Start: 2012-05-02 — End: 2012-05-02
  Administered 2012-05-02: 4 mg via INTRAVENOUS
  Filled 2012-05-02: qty 2

## 2012-05-02 MED ORDER — MORPHINE SULFATE 4 MG/ML IJ/IV SOLN (WRAP)
4.00 mg | Freq: Once | INTRAVENOUS | Status: AC
Start: 2012-05-02 — End: 2012-05-02
  Administered 2012-05-02: 4 mg via INTRAVENOUS
  Filled 2012-05-02: qty 1

## 2012-05-02 MED ORDER — SODIUM CHLORIDE 0.9 % IV BOLUS
1000.00 mL | Freq: Once | INTRAVENOUS | Status: AC
Start: 2012-05-02 — End: 2012-05-02
  Administered 2012-05-02: 1000 mL via INTRAVENOUS

## 2012-05-02 MED ORDER — ACETAMINOPHEN 500 MG PO TABS
1000.0000 mg | ORAL_TABLET | Freq: Once | ORAL | Status: DC
Start: 2012-05-02 — End: 2012-05-03
  Filled 2012-05-02: qty 2

## 2012-05-02 MED ORDER — ONDANSETRON 4 MG PO TBDP
4.0000 mg | ORAL_TABLET | Freq: Four times a day (QID) | ORAL | Status: AC | PRN
Start: 2012-05-02 — End: 2012-05-09

## 2012-05-02 MED ORDER — LEVOFLOXACIN IN D5W 750 MG/150ML IV SOLN
750.00 mg | Freq: Once | INTRAVENOUS | Status: DC
Start: 2012-05-03 — End: 2012-05-02

## 2012-05-02 MED ORDER — LEVOFLOXACIN 750 MG PO TABS
750.00 mg | ORAL_TABLET | Freq: Every day | ORAL | Status: AC
Start: 2012-05-02 — End: 2012-05-09

## 2012-05-02 NOTE — ED Notes (Addendum)
C/o lower back pain since yesterday with urinary frequency associated; states hx of UTI's; severe lower back pain as well; states feeling feverish at home and took ASA; febrile in triage; also c/o dysuria and burning

## 2012-05-02 NOTE — ED Provider Notes (Signed)
Physician/Midlevel provider first contact with patient: 05/02/12 2040         History     Chief Complaint   Patient presents with   . Back Pain   . Urinary Tract Infection Symptoms     HPI Comments: [name] with hx of HA, prev pyelonephritis, here for L flank pain. Sx of fever, L flank pain, frequency, dysuria, mild suprapubic pain, and constant dull ache all day long and slow worsening. Pt denies hematuria, any trauma, hematemesis, hematochezia or other bowel complaints. Pt last dx with pyelo in Sept and was [place]'ed with PO abx which she she states she took the entire course and was asx since then.     Patient is a 62 y.o. female presenting with back pain and urinary tract infection. The history is provided by the patient.   Back Pain   This is a recurrent problem. The current episode started 6 to 12 hours ago. The problem occurs constantly. The problem has been gradually worsening. Associated symptoms include a fever and dysuria. Pertinent negatives include no chest pain.   Urinary Tract Infection Symptoms  Associated symptoms include a fever. Pertinent negatives include no chest pain or coughing.       Past Medical History   Diagnosis Date   . Meningitis spinal    . Polio        Past Surgical History   Procedure Date   . Hysterectomy    . Appendectomy    . Carpal tunnel release    . Back surgery    . Fracture surgery        No family history on file.    Social  History   Substance Use Topics   . Smoking status: Current Every Day Smoker -- 1.0 packs/day     Types: Cigarettes   . Smokeless tobacco: Not on file   . Alcohol Use: No       .     Allergies   Allergen Reactions   . Gadolinium    . Iodine    . Penicillins        Current/Home Medications    ASPIRIN 81 MG TABLET    Take 81 mg by mouth daily.      BUTALBITAL-ACETAMINOPHEN-CAFFEINE (FIORICET, ESGIC) 50-325-40 MG PER TABLET    Take 1 tablet by mouth every 4 (four) hours as needed.    CEFDINIR (OMNICEF) 300 MG CAPSULE        CIPROFLOXACIN (CIPRO) 500 MG TABLET         FISH OIL-OMEGA-3 FATTY ACIDS 1000 MG CAPSULE    Take 1 g by mouth daily.      GABAPENTIN (NEURONTIN) 400 MG CAPSULE    Take 1 capsule (400 mg total) by mouth 3 (three) times daily.    HYDROCODONE-ACETAMINOPHEN (NORCO) 5-325 MG PER TABLET        NITROFURANTOIN, MACROCRYSTAL-MONOHYDRATE, (MACROBID) 100 MG CAPSULE        ONDANSETRON (ZOFRAN-ODT) 4 MG DISINTEGRATING TABLET        RALOXIFENE HCL (EVISTA PO)    Take 120 mg by mouth daily.      ZOSTAVAX 47829 UNT/0.65ML INJECTION            Review of Systems   Constitutional: Positive for fever and activity change.   HENT: Negative.    Eyes: Negative.    Respiratory: Negative.  Negative for cough and shortness of breath.    Cardiovascular: Negative for chest pain.   Gastrointestinal: Negative for abdominal distention.  Genitourinary: Positive for dysuria, urgency, frequency, flank pain and difficulty urinating. Negative for hematuria.   Musculoskeletal: Positive for back pain.   Neurological: Negative.    Hematological: Negative.    Psychiatric/Behavioral: Negative.    All other systems reviewed and are negative.        Physical Exam    BP 103/57  Pulse 76  Temp 100.1 F (37.8 C) (Oral)  Resp 18  SpO2 98%    Physical Exam   Constitutional: She is oriented to person, place, and time. She appears well-developed and well-nourished. She appears distressed.   HENT:   Head: Normocephalic and atraumatic.   Eyes: Conjunctivae normal and EOM are normal.   Neck: Normal range of motion. Neck supple.   Cardiovascular: Normal rate and regular rhythm.    Pulmonary/Chest: Effort normal and breath sounds normal.   Abdominal: There is tenderness.        L flank, suprapubic, and epigastric tenderness   Musculoskeletal: Normal range of motion.   Neurological: She is alert and oriented to person, place, and time.   Skin: Skin is warm and dry.       MDM and ED Course     ED Medication Orders      Start     Status Ordering Provider    05/02/12 2100   sodium chloride 0.9 % bolus 1,000  mL   Once      Route: Intravenous  Ordered Dose: 1,000 mL         Last MAR action:  [employer], DANIEL J    05/02/12 2100   acetaminophen (TYLENOL) tablet 1,000 mg   Once      Route: Oral  Ordered Dose: 1,000 mg         Last MAR action:  Not Given HERMES, Mcarthur Rossetti    05/02/12 2100   morphine injection 4 mg   Once      Route: Intravenous  Ordered Dose: 4 mg         Last MAR action:  Given HERMES, DANIEL J    05/02/12 2100   ondansetron (ZOFRAN) injection 4 mg   Once      Route: Intravenous  Ordered Dose: 4 mg         Last MAR action:  Given HERMES, DANIEL J                 MDM  Number of Diagnoses or Management Options  Diagnosis management comments: 83 F with hx of prev pyelo here for similar sx. Concern for pyelo, concomitant stones, complications such as emphysematous pyelonephritis, UTI, other abdominal emergencies such as colitis, ovarian pathology, diverticululitis.     -Febrile with leukocytosis, but normal UA, will send for CT noncon        Procedures    Clinical Impression & Disposition     Clinical Impression  Final diagnoses:   None        ED Disposition     None           New Prescriptions    No medications on file        Treatment Team: Scribe: Delila Pereyra, MD  Resident  05/02/12 [id number]

## 2012-05-02 NOTE — ED Notes (Signed)
Triage MD Note:  This patient has been seen by me or placed for labs/radiology studies per request of the Triage RN staff. I am not the primary provider.  Ann Patterson 8:20 PM        [name], Ann Rossetti, MD  05/02/12 [phone]

## 2012-05-02 NOTE — ED Provider Notes (Signed)
Physician/Midlevel provider first contact with patient: 05/02/12 2040       ATTENDING NOTE FOR RESIDENT CASE    Physician/Midlevel provider first contact with patient: 10:27 PM    Diagnosis   1. Cough    2. Fever    3. Flank pain        Disposition   ED Disposition     Discharge AIDE WOJNAR discharge to home/self care.    Condition at discharge: Stable             Meds administered in ER:     Medications   acetaminophen (TYLENOL) tablet 1,000 mg (1000 mg Oral Not Given 05/02/12 2048)   levofloxacin (LEVAQUIN) tablet 750 mg (not administered)   levofloxacin (LEVAQUIN) 750 MG tablet (not administered)   ondansetron (ZOFRAN ODT) 4 MG disintegrating tablet (not administered)   sodium chloride 0.9 % bolus 1,000 mL (1000 mL Intravenous New Bag 05/02/12 2052)   morphine injection 4 mg (4 mg Intravenous Given 05/02/12 2052)   ondansetron (ZOFRAN) injection 4 mg (4 mg Intravenous Given 05/02/12 2054)     New Prescriptions    LEVOFLOXACIN (LEVAQUIN) 750 MG TABLET    Take 1 tablet (750 mg total) by mouth daily.    ONDANSETRON (ZOFRAN ODT) 4 MG DISINTEGRATING TABLET    Take 1 tablet (4 mg total) by mouth every 6 (six) hours as needed for Nausea.      __________________________________________________   Chief Complaint  Chief Complaint   Patient presents with   . Back Pain   . Urinary Tract Infection Symptoms         HPI   Ann Patterson is a 62 y.o. female c/o left flank pain, acute onset this morning.  Associated with fever, Tmax 101, dysuria, urinary frequency, and suprapubic abdominal pain per pt c/w prior "kidney infections".    Denies diarrhea/constipation or abnormal bowel movements, last BM today.      On ROS, pt endorses mild nonproductive cough, "its not coming up"      PMD   Pcp, Noneorunknown, MD    ROS   -Documented in HPI. Otherwise all other systems reviewed and negative.   -Nursing notes reviewed by me.     -Past Medical/Surgical/Family/Social History reviewed by me     PE  Filed Vitals:    05/02/12 2014 05/02/12  2130   BP: 118/65 103/57   Pulse: 103 76   Temp: 101 F (38.3 C) 100.1 F (37.8 C)   TempSrc: Oral    Resp: 16 18   SpO2: 93% 98%      Pulse Oximetry Analysis - Normal   Vitals reviewed   GEN: . Nontoxic, Well appearing, NAD.     Head: NC/AT.     Eyes: EOMI w/o pain, nl conjunctiva. NO d/c.    ENT: MMM, symmetric OP, no drooling/trismus.    Neck: FROM, supple, no masses, no tenderness.  Chest: mild crackles in L base, o/w CTAB, NO resp distress. Equal chest rise.   CV: rrr, NO significant murmur appreciated.     Abd: no peritoneal signs, soft, benign, no distention, no tenderness    GU:      Back: mild L CVAT, no midline TTP  UpperExt: NO deformity. FROM, neurovasc intact.       LowerExt: NO edema. FROM, neurovasc intact.        Neuro: moving all ext equally, no motor deficits, normal gait.     Skin: Warm and dry. NO rash.  Psych: Normal affect. Normal insight.        EKG - Reviewed and Interpreted by Bernarda Caffey, M.D.   NA    Differential Diagnosis (not completely inclusive):   L CVAT with fever and dysuria c/w previous pyelo per pt  Will r/o Pyelo with stone  Not cw Diverticulitis/perf/abscess, no abd pain  Possible LL PNA    MDM / ED Course   Will r/o renal stone/pyelo  Will r/o PNA  Not c/w spinal abscess, no midline pain  Not c/w intraabd infection/perforation, no abd pain    Critical Care Time: No Critical Care Time    Laboratory results reviewed by EDP:  yes  Radiologic study results reviewed by EDP: yes  Radiologic Studies Interpreted (viewed) by EDP: yes    Patient feeling better at discharge.   -At discharge, pt looked well, nontoxic, no distress, and is good candidate for outpatient follow up. No signs of toxicity to suggests need for further labs, imaging or for admission.   -Results discussed w/ patient/family. All questions were answered.   -Pt is ambulatory on d/c without difficulty. Good PO.   -Pt will return if any new or worsening symptoms or concerns  -Instructed to f/u PCP tomorrow      Bernarda Caffey, MD   _________________________________________________________________   BP 103/57  Pulse 76  Temp 100.1 F (37.8 C) (Oral)  Resp 18  SpO2 98%   Past Medical History   Diagnosis Date   . Meningitis spinal    . Polio       Past Surgical History   Procedure Date   . Hysterectomy    . Appendectomy    . Carpal tunnel release    . Back surgery    . Fracture surgery       No family history on file.   History     Social History   . Marital Status: Single     Spouse Name: N/A     Number of Children: N/A   . Years of Education: N/A     Social History Main Topics   . Smoking status: Current Every Day Smoker -- 1.0 packs/day     Types: Cigarettes   . Smokeless tobacco: Not on file   . Alcohol Use: No   . Drug Use: No   . Sexually Active: Not on file     Other Topics Concern   . Not on file     Social History Narrative   . No narrative on file     Labs:   Results     Procedure Component Value Units Date/Time    Urine Culture [[id number]] Collected:05/02/12 2056    Specimen Information:Urine / Urine, Clean Catch Updated:05/02/12 2321    Blood Culture #1 [[id number]] Collected:05/02/12 2056    Specimen Information:Blood / Blood Updated:05/02/12 2317    Blood Culture #2 [[id number]] Collected:05/02/12 2101    Specimen Information:Blood / Blood Updated:05/02/12 2317    Comprehensive Metabolic Panel (CMP) [[id number]]  (Abnormal) Collected:05/02/12 2056    Specimen Information:Blood Updated:05/02/12 2207     Glucose 123 (H) mg/dL      BUN 7.0 mg/dL      Creatinine 0.9 mg/dL      Sodium 756 mEq/L      Potassium 3.7 mEq/L      Chloride 102 mEq/L      CO2 25 mEq/L      Calcium 8.8 mg/dL      Protein, Total 6.7 g/dL  Albumin 3.3 (L) g/dL      AST (SGOT) 23 U/L      ALT 7 U/L      Alkaline Phosphatase 64 U/L      Bilirubin, Total 0.7 mg/dL      Globulin 3.4 g/dL      Albumin/Globulin Ratio 1.0     Lipase [[id number]] Collected:05/02/12 2056    Specimen Information:Blood Updated:05/02/12 2207     Lipase 31 U/L     GFR [[id number]]  Collected:05/02/12 2056     EGFR >60.0   Updated:05/02/12 2207    UA with Micro [[id number]]  (Abnormal) Collected:05/02/12 2056    Specimen Information:Urine Updated:05/02/12 2146     Urine Type Clean Catch      Color, UA Yellow      Clarity, UA Clear      Specific Gravity UA 1.008      Urine pH 6.0      Leukocytes, UA Negative      Nitrite, UA Negative      Protein, UA Negative      Glucose, UA Negative      Ketones UA Negative      Urobilinogen, UA Normal mg/dL      Bilirubin, UA Negative      Blood, UA Trace (A)      RBC, UA 0 - 5 /HPF      WBC, UA 0 - 5 /HPF      Squamous Epithelial Cells, Urine 0 - 5 /HPF     CBC with Differential [[id number]]  (Abnormal) Collected:05/02/12 2056    Specimen Information:Blood / Blood Updated:05/02/12 2145     WBC 14.93 (H) x10 3/uL      RBC 3.69 (L) x10 6/uL      Hgb 11.8 (L) g/dL      Hematocrit 06.3 (L) %      MCV 95.9 fL      MCH 32.0 pg      MCHC 33.3 g/dL      RDW 15 %      Platelets 222 x10 3/uL      MPV 10.0 fL      Neutrophils 77 %      Lymphocytes Automated 14 %      Monocytes 8 %      Eosinophils Automated 0 %      Basophils Automated 0 %      Immature Granulocyte 0 %      Nucleated RBC 0 /100 WBC      Neutrophils Absolute 11.52 (H) x10 3/uL      Abs Lymph Automated 2.09 x10 3/uL      Abs Mono Automated 1.23 (H) x10 3/uL      Abs Eos Automated 0.01 x10 3/uL      Absolute Baso Automated 0.03 x10 3/uL      Absolute Immature Granulocyte 0.05 x10 3/uL     i-Stat CG4 Venous CartrIDge [[id number]] Collected:05/02/12 2057     i-STAT pH Venous 7.398 Updated:05/02/12 2108     i-STAT pCO2 Venous 41.6 mmHg      i-STAT pO2 Venous 36.0 mmHg      i-STAT HCO3 Bicarbonate Venous 25.3 mEq/L      i-STAT Total CO2 Venous 26.0 mEq/L      i-STAT Base Excess Venous 1.0 mEq/L      i-STAT O2 Saturation Venous 64.0 %      i-STAT Lactic Acid 1.2 mEq/L      i-STAT Patient Temperature 101.0  i-STAT FIO2 21      i-STAT O2 Delivery Room Air      i-STAT Allen's Test NA      i-STAT Draw Site Venous          Radiology Results (24 Hour)     Procedure Component Value Units Date/Time    CT Abd/ Pelvis without Contrast [[id number]] Collected:05/02/12 2250    Order Status:Completed  Updated:05/02/12 2312    Narrative:    Clinical History: Left flank pain and fever.     Technique: Multidetector CT imaging of the abdomen and pelvis was  performed without contrast.     Findings: The left lower lobe of the lung has an infiltrate.     The kidneys are normal in size. No renal stones are present. The left  kidney has a 5 mm subcortical high density cyst. The upper urinary  tracts are normal in caliber.     The bladder is distended. There is a moderate amount of retained stool  in the colon. No bowel distention or bowel wall thickening is present.  The cecum is low lying in the pelvis. The appendix is not seen. The  liver, spleen, pancreas and adrenal glands are unremarkable on these non  contrast enhanced images. The abdominal aorta is normal in caliber with  calcified plaques in the wall. No adenopathy is seen. There is trace  pelvic fluid. The uterus has been removed.       Impression:     Left lower lobe pneumonia.         ___________________________________________________________________  I was acting as a scribe for Bernarda Caffey, M.D. on [facility]  Treatment Team: Scribe: Letitia Libra     I am the first provider for this patient and I personally performed the services documented. Treatment Team: Scribe: Letitia Libra is scribing for me on [facility]. This note accurately reflects work and decisions made by me.  Bernarda Caffey, M.D.  ___________________________________________________________________          Daron Offer, MD  05/02/12 2352

## 2012-05-03 MED ORDER — LEVOFLOXACIN 750 MG PO TABS
ORAL_TABLET | ORAL | Status: AC
Start: 2012-05-03 — End: 2012-05-03
  Administered 2012-05-03: 750 mg via ORAL
  Filled 2012-05-03: qty 1

## 2012-05-03 NOTE — Discharge Instructions (Signed)
It was a pleasure taking care of you in the Emergency room today.    As discussed, read the attached informational materials, and if you have any new or worsening symptoms, or any new concerns return to the hospital.      Otherwise, get some rest, stay well hydrated, and follow up with your primary care doctor (bring these papers which include your test results) tomorrow.      Pneumonia    You have been diagnosed with pneumonia.    Pneumonia is an infection of the small air tubes and sacs of the lungs. It can be caused by a virus, bacteria or fungus.     Symptoms include fever, shortness of breath and a cough that produces a green or yellow material. You may have chest pain, vomiting, or headache.    Some people with pneumonia must stay in the hospital. Patients with certain risk factors, like diabetes, cancer, alcoholism, or another chronic medical condition, can be difficult to treat. The physician has determined that it is safe for you to go home.    Follow up closely with your doctor.    Do not smoke. Smoking may cause your pneumonia symptoms to become worse. Smoking also causes heart disease, cancer, and birth defects. If you smoke, ask your doctor for ideas about how to quit.   If you do not smoke, avoid others who do. Smoke will irritate your lungs and make your breathing worse while you have pneumonia.    YOU SHOULD SEEK MEDICAL ATTENTION IMMEDIATELY, EITHER HERE OR AT THE NEAREST EMERGENCY DEPARTMENT, IF ANY OF THE FOLLOWING OCCURS:   You wheeze or have trouble breathing.   You have a fever that won't go away.   You cough up blood.   You have chest pain.   You vomit and cannot keep medications down or you feel dizzy, weak or confused.   You feel worse or don't improve in 2 to 3 days.   You are unable to perform your normal activities.

## 2012-06-07 ENCOUNTER — Ambulatory Visit (INDEPENDENT_AMBULATORY_CARE_PROVIDER_SITE_OTHER): Payer: Medicare Other | Admitting: Vascular Neurology

## 2012-06-14 ENCOUNTER — Ambulatory Visit (INDEPENDENT_AMBULATORY_CARE_PROVIDER_SITE_OTHER): Payer: Medicare Other | Admitting: Vascular Neurology

## 2012-06-14 ENCOUNTER — Encounter (INDEPENDENT_AMBULATORY_CARE_PROVIDER_SITE_OTHER): Payer: Self-pay | Admitting: Vascular Neurology

## 2012-06-14 VITALS — BP 103/68 | HR 77 | Ht 61.0 in | Wt 88.9 lb

## 2012-06-14 DIAGNOSIS — M542 Cervicalgia: Secondary | ICD-10-CM

## 2012-06-14 DIAGNOSIS — R51 Headache: Secondary | ICD-10-CM

## 2012-06-14 MED ORDER — GABAPENTIN 400 MG PO CAPS
800.0000 mg | ORAL_CAPSULE | Freq: Three times a day (TID) | ORAL | Status: DC
Start: 2012-06-14 — End: 2012-09-21

## 2012-06-14 NOTE — Progress Notes (Signed)
Subjective:      Patient ID: Ann Patterson is a 62 y.o. female.    HPI  The patient is still having headaches, but is doing better since she has  been on gabapentin.  She still has some neck pain as well.  Her leg is  better, and she saw an orthopedist who gave recommendations and is on  steroids.  She is supposed to get a shot in the near future.  She had  increase in Neurontin at the orthopedist's recommendation to 800 mg in the  morning, 400 mg a day and 800 mg at night.  This seems to be working  relatively well for her, though she still has some neck pain and headaches.   At times, she reports intermittent difficulty focusing and slow cognition,  occurring a few times per month, but this predates the medication.  She has  2 to 3 headaches a week.  She has not had a severe muscle spasm around her  neck.        Review of Systems    Constitutional: Negative for fever.   Respiratory: Negative for shortness of breath.    Cardiovascular: Negative for chest pain.   Gastrointestinal: Negative for abdominal pain.   Neurological: Negative for dizziness, weakness, numbness.   Psychiatric/Behavioral: Negative for sleep disturbance.   All other systems reviewed and are negative.    Objective:   Neurologic Exam  Mental Status: The patient was awake, alert, appeared oriented and was appropriate. Speech was fluent and the patient followed commands well. Attention, concentration, and memory appeared intact. Fund of knowledge appeared appropriate for education level.   Cranial Nerves: Pupils were equally round and reactive to light bilaterally. Extraocular movements were intact without nystagmus. Hearing was intact to conversational speech. Face was symmetric. Tongue appeared midline.   Motor: Good/full strength throughout except for LLE shows some giveway weakness. Bulk appeared normal for body habitus. No obvious tremor noted.   Sensation: light touch was grossly intact.   Coordination: Finger to nose testing was intact  without dysmetria.   Gait: Normal and steady.      Assessment:     1. Headache(784.0)    2. Neck pain          Plan:     1.  Increase Neurontin to 800 mg t.i.d. for several weeks.  If necessary,  she could increase the nighttime dose of 1200 mg.  2.  Continue with recommendations as per her orthopedist.  3.  Follow up with me in about 3 months or contact the office sooner if  needed.    The patient will return for follow-up as outlined above. If there are any problems with medications (if prescribed) or questions about any available test results, or if there are any new symptoms or changes in current symptoms, the patient is instructed to call the office.     The patient may have filled out a questionnaire as part of the office visit and that information would be scanned into the chart and become part of the office visit. Notes from Dr. Stacy Gardner may also be scanned in and become part of the office visit.     More than 50% of the office visit was spent counseling the patient on one or more of the following:   -Disease state - discussion about the medical/neurological condition, diagnostic and treatment options;  -Medications - indications and side effects   -Life style issues such as diet, exercise, tobacco and alcohol use,  sleep, stress, depression and anxiety   -Primary and secondary stroke prevention, if applicable.    Dnya Hickle B. Stacy Gardner, MD  Board Certified, Neurology  Board Certified, Clinical Neurophysiology  Board Certified, Electrodiagnostic Medicine  Board Certified, Vascular Neurology

## 2012-09-21 ENCOUNTER — Encounter (INDEPENDENT_AMBULATORY_CARE_PROVIDER_SITE_OTHER): Payer: Self-pay | Admitting: Vascular Neurology

## 2012-09-21 ENCOUNTER — Ambulatory Visit (INDEPENDENT_AMBULATORY_CARE_PROVIDER_SITE_OTHER): Payer: Medicare Other | Admitting: Vascular Neurology

## 2012-09-21 VITALS — BP 109/71 | HR 58 | Ht 61.0 in | Wt 95.0 lb

## 2012-09-21 DIAGNOSIS — M542 Cervicalgia: Secondary | ICD-10-CM

## 2012-09-21 DIAGNOSIS — R51 Headache: Secondary | ICD-10-CM

## 2012-09-21 MED ORDER — GABAPENTIN 600 MG PO TABS
ORAL_TABLET | ORAL | Status: DC
Start: 2012-09-21 — End: 2012-12-20
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
## 2012-10-08 ENCOUNTER — Encounter (INDEPENDENT_AMBULATORY_CARE_PROVIDER_SITE_OTHER): Payer: Self-pay

## 2012-10-10 ENCOUNTER — Encounter (INDEPENDENT_AMBULATORY_CARE_PROVIDER_SITE_OTHER): Payer: Self-pay

## 2012-11-22 ENCOUNTER — Emergency Department
Admission: EM | Admit: 2012-11-22 | Discharge: 2012-11-22 | Disposition: A | Discharge status: Home / Self Care | Payer: Medicare Other | Attending: Emergency Medicine | Admitting: Emergency Medicine

## 2012-11-22 ENCOUNTER — Emergency Department: Payer: Medicaid Other

## 2012-11-22 DIAGNOSIS — T148XXA Other injury of unspecified body region, initial encounter: Secondary | ICD-10-CM | POA: Insufficient documentation

## 2012-11-22 DIAGNOSIS — S61209A Unspecified open wound of unspecified finger without damage to nail, initial encounter: Secondary | ICD-10-CM | POA: Insufficient documentation

## 2012-11-22 DIAGNOSIS — F17200 Nicotine dependence, unspecified, uncomplicated: Secondary | ICD-10-CM | POA: Insufficient documentation

## 2012-11-22 DIAGNOSIS — S61411A Laceration without foreign body of right hand, initial encounter: Secondary | ICD-10-CM

## 2012-11-22 DIAGNOSIS — S61409A Unspecified open wound of unspecified hand, initial encounter: Secondary | ICD-10-CM | POA: Insufficient documentation

## 2012-11-22 DIAGNOSIS — Z23 Encounter for immunization: Secondary | ICD-10-CM | POA: Insufficient documentation

## 2012-11-22 DIAGNOSIS — Z880 Allergy status to penicillin: Secondary | ICD-10-CM | POA: Insufficient documentation

## 2012-11-22 DIAGNOSIS — W268XXA Contact with other sharp object(s), not elsewhere classified, initial encounter: Secondary | ICD-10-CM | POA: Insufficient documentation

## 2012-11-22 DIAGNOSIS — S61217A Laceration without foreign body of left little finger without damage to nail, initial encounter: Secondary | ICD-10-CM

## 2012-11-22 HISTORY — DX: Dizziness and giddiness: R42

## 2012-11-22 MED ORDER — TETANUS-DIPHTH-ACELL PERTUSSIS 5-2.5-18.5 LF-MCG/0.5 IM SUSP
0.5000 mL | Freq: Once | INTRAMUSCULAR | Status: AC
Start: 2012-11-22 — End: 2012-11-22
  Administered 2012-11-22: 0.5 mL via INTRAMUSCULAR
  Filled 2012-11-22: qty 0.5

## 2012-11-22 MED ORDER — OXYCODONE-ACETAMINOPHEN 5-325 MG PO TABS
2.0000 | ORAL_TABLET | Freq: Once | ORAL | Status: AC
Start: 2012-11-22 — End: 2012-11-22
  Administered 2012-11-22: 2 via ORAL
  Filled 2012-11-22: qty 2

## 2012-11-22 MED ORDER — OXYCODONE-ACETAMINOPHEN 5-325 MG PO TABS
ORAL_TABLET | ORAL | Status: DC
Start: 2012-11-22 — End: 2012-12-10

## 2012-11-22 NOTE — ED Provider Notes (Addendum)
Physician/Midlevel provider first contact with patient: 11/22/12 1641             EMERGENCY DEPARTMENT HISTORY AND PHYSICAL EXAM    Date: 11/22/2012  Patient Name: Ann Patterson  Attending Physician: No att. providers found  Physician's Assistant: Pearlean Brownie ANN, PA      Disposition and Treatment Plan    Clinical Impression:   1. Laceration of left little finger without foreign body without damage to nail, initial encounter    2. Laceration of right palm, initial encounter    3. Nerve injury       ED Disposition     Discharge [name] discharge to home/self care.    Condition at discharge: Good               History of Presenting Illness     Chief Complaint   Patient presents with   . Finger laceration       Additional History: Ann ALBERSON is a 62 y.o. female, w/ hx of meningitis spinal, polio, vertigo, c/o lacerations to L 5th digit and R palm, PTA. Pt dropped crock pot while doing dishes and cut L 5th digit and R palm on the broken pieces. Pt is concerned about the amount of bleeding from laceration on L 5th digit. Also reports numbness in L 5th digit. Pt reports last tetanus >10 yrs ago.     Of note, pt is R handed.    PCP: Pcp, Noneorunknown, MD    Current facility-administered medications:[COMPLETED] oxyCODONE-acetaminophen (PERCOCET) 5-325 MG per tablet 2 tablet, 2 tablet, Oral, Once, Tyson Babinski, MD, 2 tablet at 11/22/12 1813;  [COMPLETED] tetanus-diphth-acell pertussis (BOOSTRIX) injection 0.5 mL, 0.5 mL, Intramuscular, Once, Tyson Babinski, MD, 0.5 mL at 11/22/12 1813  Current outpatient prescriptions:aspirin 81 MG tablet, Take 81 mg by mouth daily.  , Disp: , Rfl: ;  butalbital-acetaminophen-caffeine (FIORICET, ESGIC) 50-325-40 MG per tablet, Take 1 tablet by mouth every 4 (four) hours as needed., Disp: , Rfl: ;  cefdinir (OMNICEF) 300 MG capsule, , Disp: , Rfl: ;  ciprofloxacin (CIPRO) 500 MG tablet, , Disp: , Rfl: ;  fish oil-omega-3 fatty acids 1000 MG capsule, Take 1 g by  mouth daily.  , Disp: , Rfl:   gabapentin (NEURONTIN) 600 MG tablet, 1.5 po TID x 1 week then continue on 2 po TID, Disp: 180 tablet, Rfl: 5;  HYDROcodone-acetaminophen (NORCO) 5-325 MG per tablet, , Disp: , Rfl: ;  nitrofurantoin, macrocrystal-monohydrate, (MACROBID) 100 MG capsule, , Disp: , Rfl: ;  ondansetron (ZOFRAN-ODT) 4 MG disintegrating tablet, , Disp: , Rfl:   oxyCODONE-acetaminophen (PERCOCET) 5-325 MG per tablet, 1-2 tablets by mouth every 4-6 hours as needed for pain;  Do not drive or operate machinery while taking this medicine, Disp: 10 tablet, Rfl: 0;  Raloxifene HCl (EVISTA PO), Take 120 mg by mouth daily.  , Disp: , Rfl: ;  ZOSTAVAX 10272 UNT/0.65ML injection, , Disp: , Rfl:     Past Medical History     Past Medical History   Diagnosis Date   . Meningitis spinal    . Polio    . Vertigo      Past Surgical History   Procedure Date   . Hysterectomy    . Appendectomy    . Carpal tunnel release    . Back surgery    . Fracture surgery        Family History     No family history on file.  Social History     History     Social History   . Marital Status: Single     Spouse Name: N/A     Number of Children: N/A   . Years of Education: N/A     Social History Main Topics   . Smoking status: Current Every Day Smoker -- 0.5 packs/day     Types: Cigarettes   . Smokeless tobacco: Not on file   . Alcohol Use: No   . Drug Use: No   . Sexually Active: Not on file     Other Topics Concern   . Not on file     Social History Narrative   . No narrative on file       Allergies     Allergies   Allergen Reactions   . Gadolinium    . Iodine    . Penicillins        Review of Systems     Positive and negative ROS elements as per HPI.  All Other Systems Reviewed and Negative: Yes    Physical Exam   BP 95/53  Pulse 65  Temp 95.7 F (35.4 C)  Resp 16  Ht 1.549 m  Wt 40.824 kg  BMI 17.01 kg/m2  SpO2 96%    Constitutional: Vital signs reviewed. Well appearing. In mod Distress, AF, ra sats 96  Head: Normocephalic,  atraumatic  Eyes: No conjunctival injection. No discharge.  ENT: Mucous membranes moist  Neck: Normal range of motion. Non-tender.  Respiratory/Chest: . No respiratory distress.   UpperExtremity: rt thenar emenince 1cm lac, distl nvi.  Left pinky finger, volar pipj 2 cm lac extends to ular aspect. distl sns deficit. Full flexion. Other digits normal exam  LowerExtremity: No edema. No cyanosis.  Neurological: No focal motor deficits by observation. Speech normal. Memory normal.  Skin: Warm and dry. No rash.  Lymphatic: No cervical lymphadenopathy.  Psychiatric: Normal affect. Normal concentration.        Diagnostic Study Results   Labs -     Results     ** No Results found for the last 24 hours. **        Radiologic Studies -   Radiology Results (24 Hour)     ** No Results found for the last 24 hours. **      .    EKG Interpretation: N/A    Clinical Course in the Emergency Department     Lac Repair  Date/Time: 11/22/2012 6:00 PM  Performed by: Pearlean Brownie ANN  Authorized by: Vara Guardian  Consent: Verbal consent obtained.  Consent given by: patient  Patient understanding: patient states understanding of the procedure being performed  Patient consent: the patient's understanding of the procedure matches consent given  Procedure consent: procedure consent matches procedure scheduled  Patient identity confirmed: verbally with patient and arm band  Body area: upper extremity (rt thenar eminence and left pinky)  Laceration length: 3 cm  Foreign bodies: no foreign bodies  Tendon involvement: none  Nerve involvement: superficial  Vascular damage: no  Anesthesia: digital block and local infiltration (local into rt hand, dig block left 5th finger)  Local anesthetic: lidocaine 1% without epinephrine and bupivacaine 0.5% without epinephrine  Anesthetic total: 6 ml  Preparation: Patient was prepped and draped in the usual sterile fashion.  Irrigation solution: saline  Irrigation method: syringe  Amount of cleaning:  standard  Debridement: none  Degree of undermining: none  Skin closure: 5-0 nylon  Number of sutures:  8  Technique: simple  Approximation: close  Approximation difficulty: simple  Dressing: antibiotic ointment, gauze roll and splint  Patient tolerance: Patient tolerated the procedure well with no immediate complications.          Medical Decision Making     I am the first provider for this patient.  I reviewed the vital signs, nursing notes, past medical history, past surgical history, family history and social history.      Vital Signs -   Patient Vitals for the past 12 hrs:   BP Temp Pulse Resp   11/22/12 1628 95/53 mmHg - - -   11/22/12 1614 - 95.7 F (35.4 C) 65  16        Hx/PE  ddx-palm and finger lac, poss dig nerve inj  dw EMD, Hermes  Reviewed vitals, nursing notes  Plan-wound care, sutures, update dt, analgesia  Reviewed labs-na  Reviewed imaging-yes  Re-eval-meds given, wound care done, sutured skin total 8 nylon 5-0. Will [place] home to fu hand surg for sns co's.    Patient discharged by me at 6:02 PM.   Departure Condition: Good, stable Improved, stable  Mobility at Discharge: Ambulatory  Patient Teaching: Discharge instructions reviewed and patient verbalizes understanding  Departure Mode: By self              _______________________________     Attestations    I was acting as a Neurosurgeon for [employer], Rim Thatch ANN, PA on [employer] V  Treatment Team: Scribe: Broadus John   I am the first provider for this patient and I personally performed the services documented. Treatment Team: Scribe: Willaim Bane, Maryruth Hancock is scribing for me on [employer]. This note accurately reflects work and decisions made by me.  Sharrell Krawiec ANN, PA  ______________________________                Simon Rhein, PA  11/22/12 1711    Simon Rhein, PA  11/22/12 1802    Simon Rhein, [place]  11/22/12 2055

## 2012-11-22 NOTE — ED Notes (Signed)
Wound care completed. The wounds on left 5th finger and right thumb were cleaned with warm soapy water and then a dry sterile dressing applied over the lacerations to control bleeding. Dr. Gladstone Pih was informed.

## 2012-11-22 NOTE — ED Notes (Signed)
Patient presents to ED after laceration to left pinky finger. Bleeding controlled prior to ED arrival with pressure bandage. Also c/o small laceration to right palm from dropped crock pot.

## 2012-11-22 NOTE — ED Notes (Signed)
Pt has 2 stiches in the right thenar eminence and 6 in the left 5th finger. After the PA sutured, bacitracin, a non-adherent pad, and 2x2 sterile gauze was applied to both lacerations. The right thenar eminence was secured with 2 inch Kling. The left 5th finger non-adherent pad and sterile 2x2 was secured with tube gauze, then it was splinted with an aluminum finger splint, buddy taped to the left 4th finger, and then 2" Desma Paganini was used to hold it in place. Pulse was good and cap refill <3 seconds.

## 2012-11-22 NOTE — ED Provider Notes (Signed)
Attending Note:   The patient was seen and examined by the mid-level provider, Pearlean Brownie, and the plan of care was discussed with me. I agree with the plan as it was presented to me.   I have discussed and seen patient as well and agree with plan.  Needs follow-up with hand surgeon, given the complaints of numbness in the pinky.  Analgesia, F/U Hand, general repair and wound care   Vara Guardian, MD 5:30 PM        Vara Guardian, MD  11/22/12 [phone]

## 2012-11-22 NOTE — ED Provider Notes (Signed)
I was involved in the care of this patient briefly as a triage physician.      I briefly spoke to and examined patient in order to expedite the care of the patient in our emergency department.  Pt's care was transferred to another physician in our department for further assessment.      Claiborne Billings, MD      Tyson Babinski, MD  11/22/12 [phone]

## 2012-11-22 NOTE — Discharge Instructions (Signed)
Laceration, General Wound Care    Use the following wound care instructions for your laceration (cut):   Keep the wound clean and dry for the next 24 hours. Avoid excessive moisture. You can wash the wound gently with soap and water, then apply a dry bandage.   DO NOT allow your wound to soak in water (don't do the dishes or go swimming, for example). You can shower, but do not rub your stitches too hard. Let the wound dry before putting another bandage on.   Take off old dressings every day. Then put on a clean, dry dressing.   If the dressing sticks to the wound, slightly moisten it with water. This way, it can come off more easily.   To help remove a scab, cleanse the area with a mixture of half hydrogen peroxide and half water. This will also help us to take out the sutures when they are ready to be taken out.   Let the area dry thoroughly.   Unless you receive instructions not to do so, you can place a thin layer of antibiotic ointment over the wound. You can buy Polysporin (Triple Antibiotic), Bacitracin, or Neosporin at the store. Neosporin can sometimes cause irritation to your skin. If this happens, stop using it and switch to another topical (surface) antibiotic.   If needed, put a clean, dry bandage over the wound to protect it.    Keep the injured area elevated (lifted) for the next 24 hours. This will decrease swelling and pain. You may also want to put ice on the area. Place some ice cubes in a re-sealable (Ziploc) bag and add some water. Put a thin washcloth between the bag and the skin. Apply the ice bag to the area for at least 20 minutes. Do this at least 4 times per day. It is okay to do this more often than directed. You can also do it for longer than directed. NEVER APPLY ICE DIRECTLY TO THE SKIN.    If you had a local anesthetic, it will wear off in about 2 hours. Until then, be careful not to hurt yourself because of having less feeling in the area.    Not all lacerations (cuts)  will need antibiotics. Your doctor may have decided that you need antibiotics to prevent an infection. Be sure to fill the prescription and take all medicines as directed.    If your doctor gave you a prescription for pain medicine, fill the prescription and use the medicine as directed.    YOU SHOULD SEEK MEDICAL ATTENTION IMMEDIATELY, EITHER HERE OR AT THE NEAREST EMERGENCY DEPARTMENT, IF ANY OF THE FOLLOWING OCCURS:   You see redness or swelling.   There are red streaks or there is redness around the wound.   The wound smells bad or has a lot of drainage.   You have fever (temperature higher than 100.4F or 38C), chills, worse pain and / or swelling.

## 2012-11-29 ENCOUNTER — Emergency Department
Admission: EM | Admit: 2012-11-29 | Discharge: 2012-11-29 | Disposition: A | Discharge status: Home / Self Care | Payer: Medicare Other | Attending: Emergency Medicine | Admitting: Emergency Medicine

## 2012-11-29 DIAGNOSIS — Z5320 Procedure and treatment not carried out because of patient's decision for unspecified reasons: Secondary | ICD-10-CM | POA: Insufficient documentation

## 2012-12-06 ENCOUNTER — Ambulatory Visit
Admission: RE | Admit: 2012-12-06 | Discharge: 2012-12-06 | Disposition: A | Discharge status: Home / Self Care | Payer: Medicare Other | Source: Ambulatory Visit | Attending: Plastic Surgery | Admitting: Plastic Surgery

## 2012-12-06 DIAGNOSIS — Z01810 Encounter for preprocedural cardiovascular examination: Secondary | ICD-10-CM | POA: Insufficient documentation

## 2012-12-06 LAB — CBC
Hematocrit: 36.8 % — ABNORMAL LOW (ref 37.0–47.0)
Hgb: 12.6 g/dL (ref 12.0–16.0)
MCH: 32.3 pg — ABNORMAL HIGH (ref 28.0–32.0)
MCHC: 34.2 g/dL (ref 32.0–36.0)
MCV: 94.4 fL (ref 80.0–100.0)
MPV: 10.3 fL (ref 9.4–12.3)
Nucleated RBC: 0 (ref 0–1)
Platelets: 235 (ref 140–400)
RBC: 3.9 — ABNORMAL LOW (ref 4.20–5.40)
RDW: 16 % — ABNORMAL HIGH (ref 12–15)
WBC: 8.96 (ref 3.50–10.80)

## 2012-12-06 LAB — BASIC METABOLIC PANEL
BUN: 10 mg/dL (ref 6.0–20.0)
CO2: 27 (ref 21–30)
Calcium: 9 mg/dL (ref 8.5–10.5)
Chloride: 106 (ref 96–109)
Creatinine: 1 mg/dL (ref 0.4–1.5)
Glucose: 86 mg/dL (ref 70–100)
Potassium: 4.2 (ref 3.5–5.3)
Sodium: 140 (ref 135–146)

## 2012-12-06 LAB — HEMOLYSIS INDEX: Hemolysis Index: 17 — ABNORMAL HIGH (ref 0–9)

## 2012-12-06 LAB — GFR: EGFR: 56.5

## 2012-12-10 ENCOUNTER — Encounter: Payer: Self-pay | Admitting: Certified Registered"

## 2012-12-10 ENCOUNTER — Ambulatory Visit: Payer: Medicare Other | Admitting: Plastic Surgery

## 2012-12-10 ENCOUNTER — Encounter
Admission: RE | Disposition: A | Discharge status: Home / Self Care | Payer: Self-pay | Source: Ambulatory Visit | Attending: Plastic Surgery

## 2012-12-10 ENCOUNTER — Ambulatory Visit
Admission: RE | Admit: 2012-12-10 | Discharge: 2012-12-10 | Disposition: A | Discharge status: Home / Self Care | Payer: Medicare Other | Source: Ambulatory Visit | Attending: Plastic Surgery | Admitting: Plastic Surgery

## 2012-12-10 ENCOUNTER — Ambulatory Visit: Payer: Medicare Other | Admitting: Certified Registered"

## 2012-12-10 DIAGNOSIS — IMO0002 Reserved for concepts with insufficient information to code with codable children: Secondary | ICD-10-CM | POA: Insufficient documentation

## 2012-12-10 DIAGNOSIS — F17200 Nicotine dependence, unspecified, uncomplicated: Secondary | ICD-10-CM | POA: Insufficient documentation

## 2012-12-10 DIAGNOSIS — Z7982 Long term (current) use of aspirin: Secondary | ICD-10-CM | POA: Insufficient documentation

## 2012-12-10 DIAGNOSIS — W268XXA Contact with other sharp object(s), not elsewhere classified, initial encounter: Secondary | ICD-10-CM | POA: Insufficient documentation

## 2012-12-10 DIAGNOSIS — Y92009 Unspecified place in unspecified non-institutional (private) residence as the place of occurrence of the external cause: Secondary | ICD-10-CM | POA: Insufficient documentation

## 2012-12-10 DIAGNOSIS — S61209A Unspecified open wound of unspecified finger without damage to nail, initial encounter: Secondary | ICD-10-CM | POA: Insufficient documentation

## 2012-12-10 HISTORY — PX: REPAIR, HAND, NERVE: SHX5253

## 2012-12-10 SURGERY — REPAIR, HAND, NERVE
Anesthesia: Anesthesia General | Site: Arm Lower | Laterality: Left | Wound class: Clean

## 2012-12-10 MED ORDER — LACTATED RINGERS IV SOLN
INTRAVENOUS | Status: DC
Start: 2012-12-10 — End: 2012-12-10

## 2012-12-10 MED ORDER — MIDAZOLAM HCL 2 MG/2ML IJ SOLN
INTRAMUSCULAR | Status: AC
Start: 2012-12-10 — End: ?
  Filled 2012-12-10: qty 2

## 2012-12-10 MED ORDER — LIDOCAINE HCL 2 % IJ SOLN
INTRAMUSCULAR | Status: DC | PRN
Start: 2012-12-10 — End: 2012-12-10
  Administered 2012-12-10: 50 mg

## 2012-12-10 MED ORDER — FENTANYL CITRATE 0.05 MG/ML IJ SOLN
25.0000 ug | INTRAMUSCULAR | Status: AC | PRN
Start: 2012-12-10 — End: 2012-12-10
  Administered 2012-12-10 (×3): 25 ug via INTRAVENOUS

## 2012-12-10 MED ORDER — SODIUM CHLORIDE 0.9 % IR SOLN
Status: DC | PRN
Start: 2012-12-10 — End: 2012-12-10
  Administered 2012-12-10: 1000 mL

## 2012-12-10 MED ORDER — ONDANSETRON HCL 4 MG/2ML IJ SOLN
INTRAMUSCULAR | Status: DC | PRN
Start: 2012-12-10 — End: 2012-12-10
  Administered 2012-12-10: 4 mg via INTRAVENOUS

## 2012-12-10 MED ORDER — ONDANSETRON HCL 4 MG/2ML IJ SOLN
INTRAMUSCULAR | Status: AC
Start: 2012-12-10 — End: ?
  Filled 2012-12-10: qty 2

## 2012-12-10 MED ORDER — ENOXAPARIN SODIUM 40 MG/0.4ML SC SOLN
40.0000 mg | Freq: Once | SUBCUTANEOUS | Status: AC
Start: 2012-12-10 — End: 2012-12-10
  Administered 2012-12-10: 40 mg via SUBCUTANEOUS

## 2012-12-10 MED ORDER — MIDAZOLAM HCL 2 MG/2ML IJ SOLN
INTRAMUSCULAR | Status: DC | PRN
Start: 2012-12-10 — End: 2012-12-10
  Administered 2012-12-10: 2 mg via INTRAVENOUS

## 2012-12-10 MED ORDER — BACITRACIN ZINC 500 UNIT/GM EX OINT
TOPICAL_OINTMENT | CUTANEOUS | Status: DC | PRN
Start: 2012-12-10 — End: 2012-12-10
  Administered 2012-12-10: 1 via TOPICAL

## 2012-12-10 MED ORDER — HYDROMORPHONE HCL PF 1 MG/ML IJ SOLN
0.5000 mg | INTRAMUSCULAR | Status: DC | PRN
Start: 2012-12-10 — End: 2012-12-10

## 2012-12-10 MED ORDER — EPHEDRINE SULFATE 50 MG/ML IJ SOLN
INTRAMUSCULAR | Status: AC
Start: 2012-12-10 — End: ?
  Filled 2012-12-10: qty 1

## 2012-12-10 MED ORDER — HYDROMORPHONE HCL PF 1 MG/ML IJ SOLN
INTRAMUSCULAR | Status: AC
Start: 2012-12-10 — End: ?
  Filled 2012-12-10: qty 1

## 2012-12-10 MED ORDER — PROMETHAZINE HCL 25 MG/ML IJ SOLN
6.2500 mg | Freq: Once | INTRAMUSCULAR | Status: DC | PRN
Start: 2012-12-10 — End: 2012-12-10

## 2012-12-10 MED ORDER — FENTANYL CITRATE 0.05 MG/ML IJ SOLN
INTRAMUSCULAR | Status: AC
Start: 2012-12-10 — End: 2012-12-10
  Administered 2012-12-10: 25 ug via INTRAVENOUS
  Filled 2012-12-10: qty 2

## 2012-12-10 MED ORDER — OXYCODONE-ACETAMINOPHEN 5-325 MG PO TABS
1.0000 | ORAL_TABLET | Freq: Once | ORAL | Status: AC | PRN
Start: 2012-12-10 — End: 2012-12-10

## 2012-12-10 MED ORDER — PROPOFOL 10 MG/ML IV EMUL
INTRAVENOUS | Status: AC
Start: 2012-12-10 — End: ?
  Filled 2012-12-10: qty 20

## 2012-12-10 MED ORDER — CLINDAMYCIN PHOSPHATE IN D5W 600 MG/50ML IV SOLN
600.0000 mg | Freq: Once | INTRAVENOUS | Status: AC
Start: 2012-12-10 — End: 2012-12-10
  Administered 2012-12-10: 600 mg via INTRAVENOUS

## 2012-12-10 MED ORDER — LIDOCAINE HCL (PF) 2 % IJ SOLN
INTRAMUSCULAR | Status: AC
Start: 2012-12-10 — End: ?
  Filled 2012-12-10: qty 5

## 2012-12-10 MED ORDER — FENTANYL CITRATE 0.05 MG/ML IJ SOLN
INTRAMUSCULAR | Status: AC
Start: 2012-12-10 — End: ?
  Filled 2012-12-10: qty 2

## 2012-12-10 MED ORDER — HYDROMORPHONE HCL PF 1 MG/ML IJ SOLN
INTRAMUSCULAR | Status: DC | PRN
Start: 2012-12-10 — End: 2012-12-10
  Administered 2012-12-10: .2 mg via INTRAVENOUS

## 2012-12-10 MED ORDER — EPHEDRINE SULFATE 50 MG/ML IJ SOLN
INTRAMUSCULAR | Status: DC | PRN
Start: 2012-12-10 — End: 2012-12-10
  Administered 2012-12-10: 10 mg via INTRAVENOUS
  Administered 2012-12-10 (×2): 5 mg via INTRAVENOUS

## 2012-12-10 MED ORDER — DEXAMETHASONE SODIUM PHOSPHATE 4 MG/ML IJ SOLN (WRAP)
INTRAMUSCULAR | Status: DC | PRN
Start: 2012-12-10 — End: 2012-12-10
  Administered 2012-12-10: 4 mg via INTRAVENOUS

## 2012-12-10 MED ORDER — ONDANSETRON HCL 4 MG/2ML IJ SOLN
4.0000 mg | Freq: Once | INTRAMUSCULAR | Status: AC | PRN
Start: 2012-12-10 — End: 2012-12-10

## 2012-12-10 MED ORDER — FENTANYL CITRATE 0.05 MG/ML IJ SOLN
INTRAMUSCULAR | Status: DC | PRN
Start: 2012-12-10 — End: 2012-12-10
  Administered 2012-12-10: 50 ug via INTRAVENOUS
  Administered 2012-12-10 (×2): 25 ug via INTRAVENOUS

## 2012-12-10 MED ORDER — HYDROCODONE-ACETAMINOPHEN 5-325 MG PO TABS
1.0000 | ORAL_TABLET | Freq: Four times a day (QID) | ORAL | Status: DC | PRN
Start: 2012-12-10 — End: 2013-05-31

## 2012-12-10 MED ORDER — ONDANSETRON HCL 4 MG/2ML IJ SOLN
INTRAMUSCULAR | Status: AC
Start: 2012-12-10 — End: 2012-12-10
  Administered 2012-12-10: 4 mg via INTRAVENOUS
  Filled 2012-12-10: qty 2

## 2012-12-10 MED ORDER — BUPIVACAINE-EPINEPHRINE (PF) 0.5% -1:200000 IJ SOLN
INTRAMUSCULAR | Status: DC | PRN
Start: 2012-12-10 — End: 2012-12-10
  Administered 2012-12-10: 5 mL via INTRAMUSCULAR

## 2012-12-10 MED ORDER — PROPOFOL INFUSION 10 MG/ML
INTRAVENOUS | Status: DC | PRN
Start: 2012-12-10 — End: 2012-12-10
  Administered 2012-12-10: 100 mg via INTRAVENOUS

## 2012-12-10 MED ORDER — OXYCODONE-ACETAMINOPHEN 5-325 MG PO TABS
ORAL_TABLET | ORAL | Status: AC
Start: 2012-12-10 — End: 2012-12-10
  Administered 2012-12-10: 1 via ORAL
  Filled 2012-12-10: qty 1

## 2012-12-10 SURGICAL SUPPLY — 75 items
APPLCATOR CHLORAPREP 26ML (Prep) ×2 IMPLANT
BANDAGE ACE ELASTIC 3IN STRL (Procedure Accessories) IMPLANT
BANDAGE CMPR PLSTR CTTN CRTY 75X2IN LF (Bandage) ×2
BANDAGE CMPR PLSTR CTTN PRCR 5YDX2IN LF (Procedure Accessories)
BANDAGE COMPRESSION L75 IN X W2 IN 1 PLY (Bandage) ×1 IMPLANT
BANDAGE PROCARE COMPRESSION L5 YD X W2 (Procedure Accessories) IMPLANT
BANDAGE PROCARE COMPRESSION L5 YD X W2 IN 2 CLIP FASTENER BREATHABLE (Procedure Accessories) IMPLANT
BLADE SHARP 15 DEG (Blade) IMPLANT
CONTAINER SPEC 8OZ NS SNPON LID TRNLU (Suction) IMPLANT
DRESSING PETRO 3% BI 3BRM GZE XR 8X1IN (Dressing)
DRESSING PETRO 3% BI 3BRM GZE XR 9X5IN (Dressing) IMPLANT
DRESSING PETRO GZE VSLN 18X3IN LF STRL (Dressing) IMPLANT
DRESSING PETROLATUM L9 IN X W5 IN NONADHESIVE OCCLUSIVE BACTERIOSTATIC (Dressing) IMPLANT
DRESSING PETROLATUM XEROFORM L8 IN X W1 (Dressing) IMPLANT
DRESSING PETROLATUM XEROFORM L8 IN X W1 IN 3% BISMUTH TRIBROMOPHENATE (Dressing) IMPLANT
GAUZE KERLIX 4.5X4YDS (Dressing) IMPLANT
GLOVE SURG BIOGEL INDIC SZ 7.5 (Glove) ×4 IMPLANT
GLOVE SURG BIOGEL SZ7.5 (Glove) ×4 IMPLANT
INACTIVE USE LAWSON 120900 (Gown) ×4 IMPLANT
NEEDLE 25GA 1 1/2 (Needles) ×2 IMPLANT
NEEDLE REG BEVEL 19GX1.5IN (Needles) ×2 IMPLANT
PADDING CAST L12 FT X W2 IN REGULAR FINISH WEBRIL WEBRIL (Cast) ×2 IMPLANT
PADDING CAST L4 YD X W3 IN UNDERCAST (Cast) IMPLANT
PADDING CAST L4 YD X W4 IN UNDERCAST (Cast) IMPLANT
PADDING CAST L4 YD X W4 IN UNDERCAST MILD STRETCH COHESIVE REGULAR (Cast) IMPLANT
PADDING CAST L4 YD X W6 IN UNDERCAST (Bandage) IMPLANT
PADDING CAST L4 YD X W6 IN UNDERCAST (Cast) IMPLANT
PADDING CAST L4 YD X W6 IN UNDERCAST HAND TEARABLE SPECIALIST 100 (Bandage) IMPLANT
PADDING CAST L4 YD X W6 IN UNDERCAST MILD STRETCH COHESIVE WEBRIL (Cast) IMPLANT
PADDING CAST L4YD XW3IN UNDERCAST MILD STRETCH CHSV WEBRIL COTTON (Cast) IMPLANT
PADDING CST CTTN SPCLST 100 4YDX6IN LF (Bandage)
PADDING CST CTTN WBRL 4YDX3IN LF STRL (Cast)
PADDING CST CTTN WBRL 4YDX4IN LF STRL (Cast)
PADDING CST CTTN WBRL 4YDX6IN LF STRL (Cast)
PADDING CST WBRL 12FTX2IN LF STRL UNDCST (Cast) ×4 IMPLANT
PROTECTOR TENDON L2 IN X W2 IN (Sheet) ×25 IMPLANT
PROTECTOR TENDON L2 IN X W2 IN ABSORBABLE TENOGLIDE TYPE I COLLAGEN (Sheet) IMPLANT
PROTECTOR TNDN TY I CLGN GAG TENOGLIDE 2 (Sheet) ×50 IMPLANT
SLEEVE SEQUEN COMP KNEE REG (Procedure Accessories) ×2 IMPLANT
SOL BETADINE SOLUTION 4 OZ (Prep) ×2 IMPLANT
SOL NACL .9% IRRIG 250ML NLTX (IV Solutions) ×1
SOLUTION IRR 0.9% NACL 1000ML LF STRL (Irrigation Solutions) ×2
SOLUTION IRR 0.9% NACL 250ML LF STRL PLS (IV Solutions) ×1
SOLUTION IRRIGATION 0.9% SODIUM CHLORIDE (IV Solutions) ×1 IMPLANT
SOLUTION IRRIGATION 0.9% SODIUM CHLORIDE (Irrigation Solutions) ×1 IMPLANT
SOLUTION IRRIGATION 0.9% SODIUM CHLORIDE 1000 ML PLASTIC POUR BOTTLE (Irrigation Solutions) ×1 IMPLANT
SOLUTION IRRIGATION 0.9% SODIUM CHLORIDE 250 ML PLASTIC POUR BOTTLE (IV Solutions) ×1 IMPLANT
SPLINT 1STEP 4X15IN *USE 26386 (Cast) ×2 IMPLANT
SPLINT CAST FIBERGLASS 3X12IN (Cast) IMPLANT
SPLINT CONFORMABLE 4X15IN (Cast) IMPLANT
SPLINT CONFORMABLE 4X30IN (Cast) IMPLANT
SPONGE GAUZE L4 IN X W4 IN 4 PLY HIGH (Sponge) IMPLANT
SPONGE GAUZE L4 IN X W4 IN 4 PLY NONWOVEN LINT FREE CURITY RAYON (Sponge) IMPLANT
SPONGE GAUZE L6 3/4 IN X W6 IN MEDIUM (Dressing) IMPLANT
SPONGE GAUZE L6 3/4 IN X W6 IN MEDIUM ABSORBENT FLUFF DRY CRINKLE (Dressing) IMPLANT
SPONGE GZE CTTN MED KRLX 6.75X6IN LF (Dressing)
SPONGE GZE RYN PLSTR CRTY 4X4IN LF NS 4 (Sponge)
SUTURE ETHILON 10-0 12IN (Suture) IMPLANT
SUTURE ETHILON 4-0 P3 18IN (Suture) IMPLANT
SUTURE ETHILON 5-0 P3 18IN (Suture) IMPLANT
SUTURE ETHILON BLACK 6-0 PS-3 L18 IN (Suture) IMPLANT
SUTURE ETHILON BLACK 6-0 PS-3 L18 IN MONOFILAMENT NONABSORBABLE (Suture) IMPLANT
SUTURE ETHILON BLACK 9-0 BV100-4 L5 IN (Suture) IMPLANT
SUTURE ETHILON BLACK 9-0 BV100-4 L5 IN MONOFILAMENT NONABSORBABLE (Suture) IMPLANT
SUTURE NABSB 6-0 PS3 ETH MTPS 18IN MFL (Suture)
SUTURE NABSB 9-0 BV100-4 ETH 5IN MFL BLK (Suture)
SUTURE VICRYL 4-0 P3 18IN (Suture) IMPLANT
SYRINGE LUER LOCK 10CC (Syringes, Needles) ×2 IMPLANT
TOURNIQUET 18IN STRL (Procedure Accessories) IMPLANT
TOURNIQUET CUFF DBL 12IN (Procedure Accessories) IMPLANT
TRAY SKIN DRY SCRUB (Tray) ×2 IMPLANT
WRAP CMPR FBR NTR RBR POR CBN 5YDX4IN (Procedure Accessories)
WRAP COBAN SELF ADHRNT 2INX5YD (Procedure Accessories) IMPLANT
WRAP COMPRESSION L5 YD X W4 IN SELF (Procedure Accessories) IMPLANT
WRAP COMPRESSION L5 YD X W4 IN SELF ADHERENT ELASTIC LIGHTWEIGHT FULL (Procedure Accessories) IMPLANT

## 2012-12-10 NOTE — Anesthesia Preprocedure Evaluation (Addendum)
Anesthesia Evaluation    AIRWAY    Mallampati: II    TM distance: >3 FB  Neck ROM: full  Mouth Opening:full   CARDIOVASCULAR    cardiovascular exam normal and murmur       DENTAL         PULMONARY    pulmonary exam normal     OTHER FINDINGS                      Anesthesia Plan    ASA 3     general                     intravenous induction   Detailed anesthesia plan: general LMA            informed consent obtained    Plan discussed with CRNA.                   62 y.o.  female      Wt Readings from Last 3 Encounters:   11/22/12 40.824 kg (90 lb)   09/21/12 43.092 kg (95 lb)   06/14/12 40.325 kg (88 lb 14.4 oz)     Temp Readings from Last 3 Encounters:   11/22/12 95.7 F (35.4 C)    05/03/12 99.2 F (37.3 C)    01/29/12 99.6 F (37.6 C)      BP Readings from Last 3 Encounters:   11/22/12 95/53   09/21/12 109/71   06/14/12 103/68     Pulse Readings from Last 3 Encounters:   11/22/12 65   09/21/12 58   06/14/12 77        Allergies:  Allergies   Allergen Reactions   . Gadolinium    . Iodine    . Penicillins        Outpatient Meds:  Current Outpatient Prescriptions on File Prior to Visit   Medication Sig Dispense Refill   . aspirin 81 MG tablet Take 81 mg by mouth daily.         . butalbital-acetaminophen-caffeine (FIORICET, ESGIC) 50-325-40 MG per tablet Take 1 tablet by mouth every 4 (four) hours as needed.       . cefdinir (OMNICEF) 300 MG capsule        . ciprofloxacin (CIPRO) 500 MG tablet        . fish oil-omega-3 fatty acids 1000 MG capsule Take 1 g by mouth daily.         Marland Kitchen gabapentin (NEURONTIN) 600 MG tablet 1.5 po TID x 1 week then continue on 2 po TID  180 tablet  5   . HYDROcodone-acetaminophen (NORCO) 5-325 MG per tablet        . nitrofurantoin, macrocrystal-monohydrate, (MACROBID) 100 MG capsule        . ondansetron (ZOFRAN-ODT) 4 MG disintegrating tablet        . oxyCODONE-acetaminophen (PERCOCET) 5-325 MG per tablet 1-2 tablets by mouth every 4-6 hours as needed for pain;  Do not drive or operate  machinery while taking this medicine  10 tablet  0   . Raloxifene HCl (EVISTA PO) Take 120 mg by mouth daily.         Marland Kitchen ZOSTAVAX 16109 UNT/0.65ML injection              Problem List:  Patient Active Problem List    Diagnosis Date Noted   . Right hip pain 12/02/2011   . Right leg pain 12/02/2011   . Headache 02/07/2011   .  Neck pain 02/07/2011   . History of blurred vision 02/07/2011   . Memory loss 02/07/2011       History:  Past Medical History   Diagnosis Date   . Meningitis spinal    . Polio    . Vertigo      Past Surgical History   Procedure Date   . Hysterectomy    . Appendectomy    . Carpal tunnel release    . Back surgery    . 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 She has no SOB, is active, and has no history of fluid overload. She is completely asymptomatic.

## 2012-12-10 NOTE — Discharge Instructions (Signed)
Pain pill given at 3:50 p.m.      PLASTIC SURGERY INSTRUCTIONS:         1. Leave dressings ON, CLEAN, DRY.  2. Bag over dressings to bathe. Do not get dressings wet.   3. Take all medication as prescribed.  4. Elevate left upper extremity to minimize swelling.  5. Call Dr. Garry Heater office to schedule an appointment for next week.       Anesthesia: After Your Surgery  You've just had surgery. During surgery, you received medication called anesthesia to keep you comfortable and pain-free. After surgery, you may experience some pain or nausea. This is normal. Here are some tips for feeling better and recovering after surgery.     Stay on schedule with your medication.   Going Home  Your doctor or nurse will show you how to take care of yourself when you go home. He or she will also answer your questions. Have an adult family member or friend drive you home. For the first 24 hours after your surgery:   Do not drive or use heavy equipment.   Do not make important decisions or sign legal documents.   Avoid alcohol.   Have someone stay with you, if needed. He or she can watch for problems and help keep you safe.  Be sure to keep all follow-up doctor's appointments. And rest after your procedure for as long as your doctor tells you to.  Coping with Pain  If you have pain after surgery, pain medication will help you feel better. Take it as directed, before pain becomes severe. Also, ask your doctor or pharmacist about other ways to control pain, such as with heat, ice, and relaxation. And follow any other instructions your surgeon or nurse gives you.  Tips for Taking Pain Medication  To get the best relief possible, remember these points:   Pain medications can upset your stomach. Taking them with a little food may help.   Most pain relievers taken by mouth need at least 20 to 30 minutes to take effect.   Taking medication on a schedule can help you remember to take it. Try to time your medication so that you can take  it before beginning an activity, such as dressing, walking, or sitting down for dinner.   Constipation is a common side effect of pain medications. Contact your doctor before taking any medications like laxatives or stool softeners to help relieve constipation. Also ask about any dietary restrictions, because drinkinglots of fluids andeating foodslikefruits and vegetables that are high in fiber can also help. Remember, don't take laxatives unless your surgeon has prescribed them.   Mixing alcohol and pain medication can cause dizziness and slow your breathing. It can even be fatal. Don't drink alcohol while taking pain medication.   Pain medication can slow your reflexes. Don't drive or operate machinery while taking pain medication.  If your health care provider advises you to take acetaminophen, the generic name for Tylenol and other brand-name pain relievers, to help relieve your pain, ask for a daily dose. Remember that acetaminophen or other pain relievers may interact with prescription medicines or other over-the-counter (OTC) drugs. The FDA recommends reading OTC medication labels carefully to clearly understand the list of active ingredients, directions, and any precautions to help avoid taking too muchacetaminophen. If you have questions, ask your pharmacist or health care provider.  Managing Nausea  Some people have an upset stomach after surgery. This is often due to anesthesia, pain, pain medications,  or the stress of surgery. The following tips will help you manage nausea and get good nutrition as you recover. If you were on a special diet before surgery, ask your doctor if you should follow it during recovery. These tips may help:   Don't push yourself to eat. Your body will tell you what to eat and when.   Start off with clear liquids and soup. They are easier to digest.   Progress to semisolids (mashed potatoes, applesauce, and gelatin) as you feel ready.   Slowly move to solid foods.  Don't eat fatty, rich, or spicy foods at first.   Don't force yourself to have three large meals a day. Instead, eat smaller amounts more often.   Take pain medications with a small amount of solid food, such as crackers or toast to avoid nausea.  Call Your Surgeon If.   You still have pain an hour after taking medication (it may not be strong enough).   You feel too sleepy, dizzy, or groggy (medication may be too strong).   You have side effects like nausea, vomiting, or skin changes (rash, itching, or hives).    [address] [place], [place] [address]. All rights reserved. This information is not intended as a substitute for professional medical care. Always follow your healthcare professional's instructions.

## 2012-12-10 NOTE — H&P (Signed)
PLASTIC, RECONSTRUCTIVE, AND HAND SURGERY SERVICE  [facility] MD    HISTORY AND PHYSICAL EXAM    Date Time: 12/10/2012 12:31 PM  Patient Name: Ann Patterson  Attending Physician: Kellie Simmering, MD    Assessment:   62 y/o F with left small finger laceration  Concern for injury to digital nerve    Plan:   - to OR for wound exploration, possible repair of nerve/tendon, wound closure  - IV abx  - lovenox  - home post op in splint  - outpatient f/u next week    History of Present Illness:   Ann Patterson is a 62 y.o. female who presents to the hospital with laceration to her left small finger. Injury occurred 11/22/12 on broken dishes at home. She complains of numbness to the finger. Wound was repaired in the ED and she was referred to Dr. Toney Rakes for definitive management. Pt to OR today for exploration of wound and possible nerve repair. She denies concerns at this time.    Physical Exam:   There were no vitals filed for this visit.    Intake and Output Summary (Last 24 hours) at Date Time  No intake or output data in the 24 hours ending 12/10/12 1231    General appearance - alert, well appearing, and in no distress  Mental status - alert, oriented to person, place, and time  Eyes - pupils equal and reactive, extraocular eye movements intact  Ears - right ear normal, left ear normal  Nose - normal and patent, no erythema, discharge or polyps  Mouth - mucous membranes moist, pharynx normal without lesions  Chest - no tachypnea, retractions or cyanosis, CTAB  Heart - normal rate and regular rhythm  Extremities - LUE dressing to small finger, CDI without strikethrough, decreased sensation to distal tip, FROM    Past Medical History:     Past Medical History   Diagnosis Date   . Meningitis spinal    . Polio    . Vertigo        Past Surgical History:     Past Surgical History   Procedure Date   . Hysterectomy    . Appendectomy    . Carpal tunnel release    . Back surgery    . Fracture surgery        Family History:   No  family history on file.    Social History:     History     Social History   . Marital Status: Single     Spouse Name: N/A     Number of Children: N/A   . Years of Education: N/A     Social History Main Topics   . Smoking status: Current Every Day Smoker -- 0.5 packs/day     Types: Cigarettes   . Smokeless tobacco: Not on file   . Alcohol Use: No   . Drug Use: No   . Sexually Active: Not on file     Other Topics Concern   . Not on file     Social History Narrative   . No narrative on file       Allergies:     Allergies   Allergen Reactions   . Gadolinium    . Iodine    . Penicillins        Medications:     Prescriptions prior to admission   Medication Sig   . aspirin 81 MG tablet Take 81 mg by mouth daily.     Marland Kitchen  butalbital-acetaminophen-caffeine (FIORICET, ESGIC) 50-325-40 MG per tablet Take 1 tablet by mouth every 4 (four) hours as needed.   . cefdinir (OMNICEF) 300 MG capsule    . ciprofloxacin (CIPRO) 500 MG tablet    . fish oil-omega-3 fatty acids 1000 MG capsule Take 1 g by mouth daily.     Marland Kitchen gabapentin (NEURONTIN) 600 MG tablet 1.5 po TID x 1 week then continue on 2 po TID   . HYDROcodone-acetaminophen (NORCO) 5-325 MG per tablet    . nitrofurantoin, macrocrystal-monohydrate, (MACROBID) 100 MG capsule    . ondansetron (ZOFRAN-ODT) 4 MG disintegrating tablet    . oxyCODONE-acetaminophen (PERCOCET) 5-325 MG per tablet 1-2 tablets by mouth every 4-6 hours as needed for pain;  Do not drive or operate machinery while taking this medicine   . Raloxifene HCl (EVISTA PO) Take 120 mg by mouth daily.     Marland Kitchen ZOSTAVAX 16109 UNT/0.65ML injection        Review of Systems:   A comprehensive review of systems was: History obtained from chart review and the patient      Labs:     Results     ** No Results found for the last 24 hours. **            Rads:   Radiological Procedure reviewed.    Signed by: Honor Junes PA-C  Plastic & Reconstructive Surgery  Pager [phone]

## 2012-12-10 NOTE — Anesthesia Postprocedure Evaluation (Signed)
The patient is awake or easily arousable.  The patient's respirations and cardiovascular status have been evaluated and deemed stable post op.   Post op nausea, vomiting, and pain have been treated and reasonably controlled without respiratory or cardiovascular compromise.   Please refer to post op PACU documentation for confirmation of attainment of normothermia and hydration status.   There were no obvious anesthesia related complications.

## 2012-12-10 NOTE — Brief Op Note (Signed)
BRIEF OP NOTE    Date Time: 12/10/2012 2:51 PM    [facility]    Patient Name:   Ann Patterson    Date of Operation:   12/10/2012    Providers Performing:   Surgeon(s):  Kellie Simmering, MD  Honor Junes, PA    Assistant (s):   Gwenith Spitz, RN - Circulator  Pointer, Morrie Sheldon - Scrub Person    Operative Procedure:   Procedure(s):    1. LEFT SMALL FINGER REPAIR OF FLEXOR DIGITORUM SUPERFICIALIS ZONE 2  2. LEFT SMALL FINGER REPAIR OF FLEXO DIGITORUM PROFUNDUS ZONE 2  3. REPAIR OF ULNAR DIGITAL NERVE  4. NEUROLYSIS OF RADIAL DIGITAL NERVE  5. IMPLANTATION OF BIOLOGICAL IMPLANT  6. REVISION AND COMPLEX REPAIR FINGER 2 CM      Preoperative Diagnosis:   Pre-Op Diagnosis Codes:     * Open wound of finger(s) , complicated [883.1]     * Injury to digital nerve, upper limb, initial encounter [955.6]     OPEN WOUND FINGER WITH TENDON INVOLVEMENT 883.2    Postoperative Diagnosis:   SAME    Anesthesia:   TYPE OF ANESTHESIA: GEN LMA    DVT PROPHYLAXIS:   SEQUENTIAL DEVICE: YES  CHEMOPROPHYLAXIS:  YES    Estimated Blood Loss:    * No values recorded between 12/10/2012  1:54 PM and 12/10/2012  2:51 PM *    Implants:     Implant Name Type Inv. Item Serial No. Manufacturer Lot No. LRB No. Used Action   SHEET TENOGLIDE PROTECT 2X2IN - [id number] Sheet SHEET TENOGLIDE PROTECT 2X2IN   [employer] [id number] Left 1 Implanted       Drains:   Drains: no    Specimens:       Findings:   NEAR COMPLETE FDP LAC  ULNAR FDS SLIP COMPLETE LAC    Complications:   NONE      Signed by: Kellie Simmering, MD

## 2012-12-10 NOTE — Transfer of Care (Signed)
Anesthesia Transfer of Care Note    Patient: Ann Patterson    Procedures performed: Procedure(s) with comments:  REPAIR, HAND, NERVE - LEFT SMALL FINGER EXPLORATION; REPAIR DIGITAL NERVE    Anesthesia type: General LMA    Patient location:Phase I PACU    Last vitals:   Filed Vitals:    12/10/12 1511   BP: 124/68   Pulse: 70   Temp: 97.9 F (36.6 C)   Resp: 16   SpO2: 100%       Post pain: Patient not complaining of pain, continue current therapy      Mental Status:awake    Respiratory Function: tolerating room air    Cardiovascular: stable    Nausea/Vomiting: patient not complaining of nausea or vomiting    Hydration Status: adequate    Post assessment: no apparent anesthetic complications

## 2012-12-11 ENCOUNTER — Encounter: Payer: Self-pay | Admitting: Plastic Surgery

## 2012-12-20 ENCOUNTER — Ambulatory Visit (INDEPENDENT_AMBULATORY_CARE_PROVIDER_SITE_OTHER): Payer: Medicare Other | Admitting: Vascular Neurology

## 2012-12-20 ENCOUNTER — Encounter (INDEPENDENT_AMBULATORY_CARE_PROVIDER_SITE_OTHER): Payer: Self-pay | Admitting: Vascular Neurology

## 2012-12-20 VITALS — BP 91/55 | HR 68 | Ht 61.0 in | Wt 91.6 lb

## 2012-12-20 DIAGNOSIS — R51 Headache: Secondary | ICD-10-CM

## 2012-12-20 DIAGNOSIS — M542 Cervicalgia: Secondary | ICD-10-CM

## 2012-12-20 MED ORDER — GABAPENTIN 600 MG PO TABS
900.0000 mg | ORAL_TABLET | Freq: Three times a day (TID) | ORAL | Status: DC
Start: 2012-12-20 — End: 2013-02-06
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
## 2012-12-24 NOTE — Op Note (Signed)
Procedure Date: 12/10/2012     Patient Type: A     SURGEON: Kellie Simmering MD  ASSISTANT:  Lynnda Child PA     PREOPERATIVE DIAGNOSES:  1.  Laceration of the finger with tendon involvement.  2.  Laceration of finger, complex.  3.  Injury to digital nerve.     POSTOPERATIVE DIAGNOSES:  1.  Laceration of the finger with tendon involvement.  2.  Laceration of finger, complex.  3.  Injury to digital nerve.     TITLE OF PROCEDURE:  1.  Left small finger repair of flexor digitorum superficialis tendon, zone  2.  2.  Left small finger repair of flexor digitorum profundus tendon, zone 2.  3.  Repair of ulnar digital nerve, small finger.  4.  Neurolysis radial digital nerve, small finger.  5.  Implantation of biological implant.  6.  Revision and complex repair of finger, 2 cm.     ANESTHESIA:  General, LMA.     ESTIMATED BLOOD LOSS:  Minimal.     PACKS:  None.     DRAINS:  None.     SPECIMENS:  None.     COMPLICATIONS:  None.       CONDITION:  The patient stable to PACU.     PREOPERATIVE INDICATIONS:  The patient is a 62 year old female who presents for management of left  small finger laceration with tendon injury.  I have recommend exploration  and repair of involved tendons and repair of the digital nerve as she has  some numbness.  We have discussed the planned procedure as well as risks  and benefits, risks discussed including but not limited to bleeding,  infection, possible need for further surgery, possible damage to  surrounding structures, such as nerves, vessels, bones, tendons, ligaments  and/or joints, possible decreased function, possible tendon rupture,  possible missed injuries, possible failure to improve sensation and/or  function, possible pain, scarring, stiffness and/or open wound.  Risks,  benefits and alternatives were discussed, all questions were answered and  informed consent was obtained by me preoperatively.     DESCRIPTION OF PROCEDURE:  The patient was preoped and brought to the operating  room, identified as  the patient by the operating surgeon, placed in supine position on the  operating table, placed under general anesthesia via the laryngeal mask  airway by the department of anesthesia.  Perioperative antibiotics were on  board.  Pressure points were cushioned.  SCDs were on and functional prior  to the induction of anesthesia.  Chemoprophylaxis was also present for DVT  prevention.  Left upper extremity was prepped and draped in usual sterile  fashion after placing of a tourniquet and positioning out at 90 degrees.   Complete timeout procedure was done.  We began by removing the previously  placed sutures.  Edges of the laceration were sharply excised out.   Extension incisions were made, flaps were elevated and exploration  undertaken.  The radial digital nerve was neurolysed as it was in the  region of the scarring and the trauma.  Complete neurolysis was done, it  was not lacerated.  Ulnar digital nerve was found to be lacerated and it  was dissected out proximally and distally.  We evaluated the tendons.  The  FDP tendon was a near-complete laceration.  The FDS tendon ulnar slip was  completely lacerated.  Ulnar slip was grasped and brought back into  alignment.  It was repaired with 4-0 Prolene sutures in figure-of-eight  fashion  back to the distal end.  It was done with at least 2  figure-of-eight sutures for additional reinforcement.  The FDP slip was  then repaired.  This was done with a modified Bunnel-type tendon grasping  suture with 4-0 Prolene.  The epitendinous was then run with a 6-0 nylon as  an additional strand of repair.  It was not run circumferentially as this  was not a circumferential laceration, but a near-complete laceration.  This  was done without difficulty and again, this was in zone 2.  We minimized  venting of the pulleys, but we did have to vent A4 on the distal half to  allow for the repair and allow for adequate slide of the tendons.  Ulnar  digital nerve was  approached next.  This was already dissected out  proximally and distally under high-power loupe magnification, microsurgical  instrumentation and microsurgical technique.  We dissected it out further.   We freshened the ends.  We brought it into alignment, and a direct repair  was undertaken with 8-0 nylon sutures on a BV130-5 needle with several  interrupted sutures.  This was done without difficulty and without undue  tension on the repair.  I was able to fully extend the digit without  disrupting the repair.  Biological implant was brought into wrap the tendon  and to prevent excess scarring around the nerve, and this was implanted  without difficulty.  Laceration edges were sharply already revised and  these were integrated into the closure of the incisions.  Corners were  closed with 5-0 Monocryl.  Straight limbs were closed with a running  horizontal stitch of 5-0 Monocryl.  The patient was placed into a splint  after having a digital block for pain control, and was brought out of  anesthesia and transferred to the PACU in stable condition, having  tolerated this procedure well.     Please note, I was present and personally performed all critical portions  of the case.  Needle, sponge, and instrument counts were correct and the  patient tolerated the procedure well.           D:  12/24/2012 16:09 PM by Dr. Kellie Simmering, MD ([id number])  T:  12/24/2012 19:01 PM by       Everlean Cherry: [id number]) (Doc ID: [id number])

## 2013-02-06 ENCOUNTER — Encounter (INDEPENDENT_AMBULATORY_CARE_PROVIDER_SITE_OTHER): Payer: Self-pay | Admitting: Vascular Neurology

## 2013-02-06 ENCOUNTER — Ambulatory Visit (INDEPENDENT_AMBULATORY_CARE_PROVIDER_SITE_OTHER): Payer: Medicare Other | Admitting: Vascular Neurology

## 2013-02-06 VITALS — BP 104/67 | HR 66

## 2013-02-06 DIAGNOSIS — M542 Cervicalgia: Secondary | ICD-10-CM

## 2013-02-06 DIAGNOSIS — R51 Headache: Secondary | ICD-10-CM

## 2013-02-06 MED ORDER — GABAPENTIN 600 MG PO TABS
1200.0000 mg | ORAL_TABLET | Freq: Three times a day (TID) | ORAL | Status: DC
Start: 2013-02-06 — End: 2013-06-07
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
## 2013-02-14 ENCOUNTER — Ambulatory Visit (INDEPENDENT_AMBULATORY_CARE_PROVIDER_SITE_OTHER): Payer: Medicare Other | Admitting: Vascular Neurology

## 2013-03-11 ENCOUNTER — Other Ambulatory Visit: Payer: Self-pay | Admitting: Obstetrics & Gynecology

## 2013-03-11 DIAGNOSIS — Z1231 Encounter for screening mammogram for malignant neoplasm of breast: Secondary | ICD-10-CM

## 2013-03-15 ENCOUNTER — Ambulatory Visit (INDEPENDENT_AMBULATORY_CARE_PROVIDER_SITE_OTHER): Payer: Medicare Other | Admitting: Vascular Neurology

## 2013-04-01 ENCOUNTER — Ambulatory Visit
Admission: RE | Admit: 2013-04-01 | Discharge: 2013-04-01 | Disposition: A | Discharge status: Home / Self Care | Payer: Medicare Other | Source: Ambulatory Visit | Attending: Surgical | Admitting: Surgical

## 2013-04-01 ENCOUNTER — Ambulatory Visit
Admission: RE | Admit: 2013-04-01 | Discharge: 2013-04-01 | Disposition: A | Discharge status: Home / Self Care | Payer: Medicare Other | Source: Ambulatory Visit | Attending: Obstetrics & Gynecology | Admitting: Obstetrics & Gynecology

## 2013-04-01 ENCOUNTER — Other Ambulatory Visit: Payer: Self-pay | Admitting: Surgical

## 2013-04-01 DIAGNOSIS — M79609 Pain in unspecified limb: Secondary | ICD-10-CM | POA: Insufficient documentation

## 2013-04-01 DIAGNOSIS — IMO0001 Reserved for inherently not codable concepts without codable children: Secondary | ICD-10-CM

## 2013-04-01 DIAGNOSIS — Z1231 Encounter for screening mammogram for malignant neoplasm of breast: Secondary | ICD-10-CM | POA: Insufficient documentation

## 2013-04-01 DIAGNOSIS — S61209A Unspecified open wound of unspecified finger without damage to nail, initial encounter: Secondary | ICD-10-CM

## 2013-04-01 DIAGNOSIS — M899 Disorder of bone, unspecified: Secondary | ICD-10-CM | POA: Insufficient documentation

## 2013-04-01 DIAGNOSIS — M949 Disorder of cartilage, unspecified: Secondary | ICD-10-CM | POA: Insufficient documentation

## 2013-04-05 ENCOUNTER — Encounter (INDEPENDENT_AMBULATORY_CARE_PROVIDER_SITE_OTHER): Payer: Self-pay | Admitting: Vascular Neurology

## 2013-04-05 ENCOUNTER — Ambulatory Visit (INDEPENDENT_AMBULATORY_CARE_PROVIDER_SITE_OTHER): Payer: Medicare Other | Admitting: Vascular Neurology

## 2013-04-05 VITALS — BP 120/65 | HR 92 | Wt 97.4 lb

## 2013-04-05 NOTE — Progress Notes (Signed)
Subjective:      Patient ID: Ann Patterson is a 62 y.o. female.    HPI    The patient tells me that she is doing much better since gabapentin was  increased to 1200 mg t.i.d.  She has improved lateral range of motion,  especially to the right, although she still has some impaired range of  motion.  She reports occasional tightness over the right cervical  paraspinal muscles and trapezius that extends into the right shoulder.   Overall, she says she is about 80% better.        Review of Systems    Constitutional: Negative for fever.   Respiratory: Negative for shortness of breath.    Cardiovascular: Negative for chest pain.   Gastrointestinal: Negative for abdominal pain.   Neurological: Negative for dizziness, weakness, numbness and headaches.   Psychiatric/Behavioral: Negative for sleep disturbance.   All other systems reviewed and are negative.    Objective:   Neurologic Exam  Mental Status: The patient was awake, alert, appeared oriented and was appropriate. Speech was fluent and the patient followed commands well. Attention, concentration, and memory appeared intact. Fund of knowledge appeared appropriate for education level.   Cranial Nerves: Pupils were equally round and reactive to light bilaterally. Extraocular movements were intact without nystagmus. Hearing was intact to conversational speech. Face was symmetric. Tongue appeared midline. Slightly reduced R neck ROM.  Motor: Good/full strength throughout without clear focality or weakness. Bulk appeared normal for body habitus. No obvious tremor noted.   Sensation: light touch was grossly intact.   Gait: Normal and steady.      Assessment:     1. Neck pain    2. Headache        Plan:     1.  Continue with gabapentin 1200 mg t.i.d.  2.  We will seek approval for trigger point injections to help with some of  her neck pain and trapezius pain.  3.  Another option to consider would be Botox although the patient was not  interested at this point.  4.  I have  asked her to follow up with me in 2 to 3 months or contact the  office sooner if needed.    The patient will return for follow-up as outlined above. If there are any problems with medications (if prescribed) or questions about any available test results, or if there are any new symptoms or changes in current symptoms, the patient is instructed to call the office.     The patient may have filled out a questionnaire as part of the office visit and that information would be scanned into the chart and become part of the office visit. Notes from Dr. Stacy Gardner may also be scanned in and become part of the office visit.     More than 50% of the office visit was spent counseling the patient on one or more of the following:   -Disease state - discussion about the medical/neurological condition, diagnostic and treatment options;  -Medications - indications and side effects   -Life style issues such as diet, exercise, tobacco and alcohol use, sleep, stress, depression and anxiety   -Primary and secondary stroke prevention, if applicable.    Rakeen Gaillard B. Stacy Gardner, MD  Board Certified, Neurology  Board Certified, Clinical Neurophysiology  Board Certified, Electrodiagnostic Medicine  Board Certified, Vascular Neurology

## 2013-04-05 NOTE — Progress Notes (Signed)
Have you sought care outside of [place] since we last saw you?   No

## 2013-04-25 DIAGNOSIS — J189 Pneumonia, unspecified organism: Secondary | ICD-10-CM

## 2013-04-25 HISTORY — DX: Pneumonia, unspecified organism: J18.9

## 2013-05-21 ENCOUNTER — Ambulatory Visit (INDEPENDENT_AMBULATORY_CARE_PROVIDER_SITE_OTHER): Payer: Medicare Other | Admitting: Vascular Neurology

## 2013-05-21 ENCOUNTER — Encounter (INDEPENDENT_AMBULATORY_CARE_PROVIDER_SITE_OTHER): Payer: Self-pay | Admitting: Vascular Neurology

## 2013-05-21 VITALS — BP 109/68 | HR 81

## 2013-05-21 DIAGNOSIS — G5603 Carpal tunnel syndrome, bilateral upper limbs: Secondary | ICD-10-CM

## 2013-05-21 DIAGNOSIS — R51 Headache: Secondary | ICD-10-CM

## 2013-05-21 DIAGNOSIS — M542 Cervicalgia: Secondary | ICD-10-CM

## 2013-05-21 DIAGNOSIS — M5412 Radiculopathy, cervical region: Secondary | ICD-10-CM | POA: Insufficient documentation

## 2013-05-21 DIAGNOSIS — G5600 Carpal tunnel syndrome, unspecified upper limb: Secondary | ICD-10-CM

## 2013-05-21 MED ORDER — AMITRIPTYLINE HCL 10 MG PO TABS
ORAL_TABLET | ORAL | Status: DC
Start: 2013-05-21 — End: 2013-05-31

## 2013-05-21 NOTE — Progress Notes (Signed)
Subjective:      Patient ID: Ann Patterson is a 63 y.o. female.    HPI  The patient was sent by her hand surgeon for nerve conduction studies which  she had done through Dr. Raleigh Callas last month.  She still complains of  soreness and pain secondary to the study, and feels increased weight on her  shoulders and her arms ache.  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  She continues to complain of neck pain and headache.  She  continued on gabapentin.  She tried Cymbalta in the past, but said this  made her depressed.     Her last MRI was from January 2012 showing mild multilevel foraminal  stenosis, postsurgical changes, but no central stenosis noted.        Review of Systems    Constitutional: Negative for fever.   Respiratory: Negative for shortness of breath.    Cardiovascular: Negative for chest pain.   Gastrointestinal: Negative for abdominal pain.   Neurological: Negative for dizziness, weakness, numbness and headaches.   Psychiatric/Behavioral: Negative for sleep disturbance.   All other systems reviewed and are negative.    Objective:   Neurologic Exam  Mental Status: The patient was awake, alert, appeared oriented and was appropriate. Speech was fluent and the patient followed commands well. Attention, concentration, and memory appeared intact. Fund of knowledge appeared appropriate for education level.   Cranial Nerves: Pupils were equally round and reactive to light bilaterally. Extraocular movements were intact without nystagmus. Hearing was intact to conversational speech. Face was symmetric. Tongue appeared midline.   Motor: Good/full strength throughout without clear focality or weakness. Bulk appeared normal for body habitus. No obvious tremor noted.   Sensation: light touch was grossly intact.   Coordination: Finger to nose testing was intact without dysmetria.   Gait: Normal and steady.      Assessment:     1.  Neck pain    2. Headache    3. Cervical radiculopathy    4. Carpal tunnel syndrome on both sides          Plan:     1. Get updated MRI C-spine.  2. Continue GBP 1200mg  TID.  3. Trial of Elavil, increase to 30mg  qhs over several weeks. Side effects discussed.  4. Referral to PT.  5. Recommend referral to pain management to consider interventional treatment for neck pain.  6. Consider TPI or Botox if no other treatment options after above.  7. Follow up in 4-6 weeks or contact the office sooner if needed.    The patient will return for follow-up as outlined above. If there are any problems with medications (if prescribed) or questions about any available test results, or if there are any new symptoms or changes in current symptoms, the patient is instructed to call the office.     The patient may have filled out a questionnaire as part of the office visit and that information would be scanned into the chart and become part of the office visit. Notes from Dr. Stacy Gardner may also be scanned in and become part of the office visit.     More than 50% of the office visit was spent counseling the patient on one or more of the following:   -Disease state - discussion about the medical/neurological condition, diagnostic and treatment options;  -Medications - indications and side effects   -Life style issues  such as diet, exercise, tobacco and alcohol use, sleep, stress, depression and anxiety   -Primary and secondary stroke prevention, if applicable.    Amante Fomby B. Stacy Gardner, MD  Board Certified, Neurology  Board Certified, Clinical Neurophysiology  Board Certified, Electrodiagnostic Medicine  Board Certified, Vascular Neurology

## 2013-05-21 NOTE — Progress Notes (Signed)
Have you sought care outside of [place] since we last saw you?   No

## 2013-05-27 ENCOUNTER — Ambulatory Visit: Payer: Medicare Other | Attending: Vascular Neurology

## 2013-05-27 DIAGNOSIS — M5030 Other cervical disc degeneration, unspecified cervical region: Secondary | ICD-10-CM | POA: Insufficient documentation

## 2013-05-27 DIAGNOSIS — M47812 Spondylosis without myelopathy or radiculopathy, cervical region: Secondary | ICD-10-CM | POA: Insufficient documentation

## 2013-05-27 DIAGNOSIS — M5412 Radiculopathy, cervical region: Secondary | ICD-10-CM | POA: Insufficient documentation

## 2013-05-27 DIAGNOSIS — Z981 Arthrodesis status: Secondary | ICD-10-CM | POA: Insufficient documentation

## 2013-05-29 ENCOUNTER — Encounter (INDEPENDENT_AMBULATORY_CARE_PROVIDER_SITE_OTHER): Payer: Self-pay

## 2013-05-29 NOTE — Progress Notes (Signed)
Spoke with patient regarding stable MRI per Dr. Stacy Gardner. Patient has a pending appointment on 2/13th. Still complaining of persistent neck pain and headaches, will discuss issues with Dr. Stacy Gardner at time of appointment.

## 2013-05-31 ENCOUNTER — Encounter (INDEPENDENT_AMBULATORY_CARE_PROVIDER_SITE_OTHER): Payer: Self-pay | Admitting: Internal Medicine

## 2013-05-31 ENCOUNTER — Ambulatory Visit (INDEPENDENT_AMBULATORY_CARE_PROVIDER_SITE_OTHER): Payer: Medicare Other | Admitting: Internal Medicine

## 2013-05-31 VITALS — BP 123/71 | HR 76 | Ht 61.0 in | Wt 93.0 lb

## 2013-05-31 DIAGNOSIS — I10 Essential (primary) hypertension: Secondary | ICD-10-CM

## 2013-05-31 NOTE — Progress Notes (Signed)
[facility]    No chief complaint on file.        History of Present Illness   Cervical Radiculopathy. 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 Did see a cardiologist in [place] and records are available). According to patient she has had multiple non invasive cardiac testing. Has a son with Long QT and has lost one child with cardiomyopathy and coronary anomalies ( Age 13th died). Her Neurosurgeon has suggested Elavil or Amitriptyline  ( both are contraindicated in congenital Long QT syndrome).      Past Medical History     Past Medical History   Diagnosis Date   . Meningitis spinal    . Polio    . Vertigo    . Hyperlipidemia        Past Surgical History     Past Surgical History   Procedure Date   . Hysterectomy    . Appendectomy    . Carpal tunnel release    . Back surgery    . Fracture surgery    . Repair, hand, nerve 12/10/2012     Procedure: REPAIR, HAND, NERVE;  Surgeon: Kellie Simmering, MD;  Location: [facility];  Service: Plastics;  Laterality: Left;  LEFT SMALL FINGER EXPLORATION; REPAIR DIGITAL NERVE       Family History     Family History   Problem Relation Age of Onset   . Breast cancer Neg Hx    . Myocardial Infarction Mother    . Diverticulitis Sister    . Diabetes Maternal Grandmother    . Brain cancer Maternal Grandfather        Social History     History     Social History   . Marital Status: Single     Spouse Name: N/A     Number of Children: N/A   . Years of Education: N/A     Occupational History   . Not on file.     Social History Main Topics   . Smoking status: Current Every Day Smoker -- 0.5 packs/day     Types: Cigarettes   . Smokeless tobacco: Not on file   . Alcohol Use: No   . Drug Use: No   . Sexually Active: Not on file     Other Topics Concern   . Not on file     Social History Narrative   . No narrative on file       Allergies     Allergies   Allergen Reactions   . Gadolinium    . Iodine    . Penicillins        Medications     Current  Outpatient Prescriptions on File Prior to Visit   Medication Sig Dispense Refill   . amitriptyline (ELAVIL) 10 MG tablet 1 po qhs x 1 week then 2 po qhs x 1 week, then 3 po qhs  90 tablet  3   . aspirin 81 MG tablet Take 81 mg by mouth daily.         . fish oil-omega-3 fatty acids 1000 MG capsule Take 1 g by mouth daily.         Marland Kitchen gabapentin (NEURONTIN) 600 MG tablet Take 2 tablets (1,200 mg total) by mouth 3 (three) times daily.  180 tablet  5   . Raloxifene HCl (EVISTA PO) Take 120 mg by mouth daily.         . [DISCONTINUED] HYDROcodone-acetaminophen (NORCO)  5-325 MG per tablet        . [DISCONTINUED] HYDROcodone-acetaminophen (NORCO) 5-325 MG per tablet Take 1 tablet by mouth every 6 (six) hours as needed for Pain.  40 tablet  0       Review of Systems     Constitutional: Negative for fevers and chills  Skin: No rash or lesions  Respiratory: Negative for cough, wheezing, or hemoptysis  Cardiovascular: as per HPI  Gastrointestinal: Negative for abdominal pain, nausea, vomiting and diarrhea  Musculoskeletal:  No arthritic symptoms  Genitourinary: Negative for dysuria  Otherwise 10 point review of systems is negative.      Physical Exam     Filed Vitals:    05/31/13 1144   BP: 123/71   Pulse: 76       Body mass index is 17.58 kg/(m^2).    General:  Patient appears their stated age, well-nourished.  Alert and in no apparent distress.  Eyes: No conjunctivitis, no purulent discharge, no lid lag  ENT:  Hearing grossly intact, Nares patent bilaterally, Lips moist, color appropriate for race.  Respiratory: Clear to auscultation and percussion throughout. Respiratory effort unlabored, chest expansion symmetric.    Cardio: Regular rate and rhythm. Normal S1/S2 No carotid bruits or thrills, no JVD.  Extremities: warm, pulses 2+, no peripheral edema  GI: Soft, nondistended, nontender.  No guarding or rebound.  Skin: Color appropriate for race, Skin warm, dry, and intact  Psychiatric: Good insight and judgment, oriented to  person, place, and time    Labs     CBC:   WBC   Date/Time Value Range Status   12/06/2012  3:45 PM 8.96  3.50 - 10.80 Final   01/29/2012  8:58 PM 14.50* 3.50 - 10.80 x10 3/uL Final        RBC   Date/Time Value Range Status   12/06/2012  3:45 PM 3.90* 4.20 - 5.40 Final        Hgb   Date/Time Value Range Status   12/06/2012  3:45 PM 12.6  12.0 - 16.0 g/dL Final        Hematocrit   Date/Time Value Range Status   12/06/2012  3:45 PM 36.8* 37.0 - 47.0 % Final        MCV   Date/Time Value Range Status   12/06/2012  3:45 PM 94.4  80.0 - 100.0 fL Final        MCHC   Date/Time Value Range Status   12/06/2012  3:45 PM 34.2  32.0 - 36.0 g/dL Final        RDW   Date/Time Value Range Status   12/06/2012  3:45 PM 16* 12 - 15 % Final        Platelets   Date/Time Value Range Status   12/06/2012  3:45 PM 235  140 - 400 Final       CMP:   Sodium   Date/Time Value Range Status   12/06/2012  3:45 PM 140  135 - 146 Final        Potassium   Date/Time Value Range Status   12/06/2012  3:45 PM 4.2  3.5 - 5.3 Final        Chloride   Date/Time Value Range Status   12/06/2012  3:45 PM 106  96 - 109 Final        CO2   Date/Time Value Range Status   12/06/2012  3:45 PM 27  21 - 30 Final        Glucose  Date/Time Value Range Status   12/06/2012  3:45 PM 86  70 - 100 mg/dL Final      Interpretive Data for Adult Female and Female Population      Indeterminate Range:  100-125 mg/dL      Equal to or greater than 126 mg/dL meets the ADA      guidelines for Diabetes Mellitus diagnosis if symptoms      are present and confirmed by repeat testing.      Random (Non-Fasting)Interpretive Data (Adults):      Equal to or greater than 200 mg/dL meets the ADA      guidelines for Diabetes Mellitus diagnosis if symptoms      are present and confirmed by Fasting Glucose or GTT.        BUN   Date/Time Value Range Status   12/06/2012  3:45 PM 10.0  6.0 - 20.0 mg/dL Final        Protein, Total   Date/Time Value Range Status   05/02/2012  8:56 PM 6.7  6.0 - 8.3 g/dL Final         Alkaline Phosphatase   Date/Time Value Range Status   05/02/2012  8:56 PM 64  40 - 150 U/L Final        AST (SGOT)   Date/Time Value Range Status   05/02/2012  8:56 PM 23  5 - 34 U/L Final        ALT   Date/Time Value Range Status   05/02/2012  8:56 PM 7  0 - 55 U/L Final       Lipid Panel   Cholesterol   Date/Time Value Range Status   02/10/2011 10:17 AM 224* 100 - 199 mg/dL Final        Triglycerides   Date/Time Value Range Status   02/10/2011 10:17 AM 102  0 - 149 mg/dL Final        HDL   Date/Time Value Range Status   02/10/2011 10:17 AM 66  >39 mg/dL Final      According to ATP-III Guidelines, HDL-C >59 mg/dL is considered a      negative risk factor for CHD.         EKG   I have reviewed and interpreted the EKG.  The EKG ( no acute changes, QT 0.57msec at rest    Assessment and Plan   Radiculopathy      Managed by [facility]    Long QT Interval Syndrome      We researched the list of medications that aggravates the congenital long QT syndrome and it includes the two medications ( Elavil and Amitriptyline). The patient has a son that died with this syndrome.  Aortic Valve Disease       Awaiting old records from former cardiologist  Recommendations:  The patient was given a list of medications to avoid  Will follow up with [facility]    Janace Litten, MD  [facility]

## 2013-06-03 ENCOUNTER — Telehealth (INDEPENDENT_AMBULATORY_CARE_PROVIDER_SITE_OTHER): Payer: Self-pay

## 2013-06-03 NOTE — Telephone Encounter (Signed)
L/M to call regarding name of neurosurgeon and fax number so I can send latest OV from [facility].

## 2013-06-05 ENCOUNTER — Encounter (INDEPENDENT_AMBULATORY_CARE_PROVIDER_SITE_OTHER): Payer: Self-pay

## 2013-06-05 ENCOUNTER — Encounter (INDEPENDENT_AMBULATORY_CARE_PROVIDER_SITE_OTHER): Payer: Self-pay | Admitting: Vascular Neurology

## 2013-06-05 NOTE — Progress Notes (Signed)
Office visit sent to Neurosurgeon, Dr. Jacqulynn Cadet, at (o) [phone]  (f) [phone]  Per [facility] request.  Ann Patterson

## 2013-06-06 ENCOUNTER — Encounter (INDEPENDENT_AMBULATORY_CARE_PROVIDER_SITE_OTHER): Payer: Self-pay

## 2013-06-06 NOTE — Progress Notes (Signed)
Spoke with patient, Dr. Stacy Gardner wanted to stop her Elavil. She said she never started the medication due to the risks. Patient reminded of her follow up appointment tomorrow, 2/13th at 1045am. Patient understood.

## 2013-06-07 ENCOUNTER — Encounter (INDEPENDENT_AMBULATORY_CARE_PROVIDER_SITE_OTHER): Payer: Self-pay | Admitting: Vascular Neurology

## 2013-06-07 ENCOUNTER — Other Ambulatory Visit (INDEPENDENT_AMBULATORY_CARE_PROVIDER_SITE_OTHER): Payer: Self-pay

## 2013-06-07 ENCOUNTER — Ambulatory Visit (INDEPENDENT_AMBULATORY_CARE_PROVIDER_SITE_OTHER): Payer: Medicare Other | Admitting: Vascular Neurology

## 2013-06-07 VITALS — BP 102/63 | HR 93

## 2013-06-07 DIAGNOSIS — M542 Cervicalgia: Secondary | ICD-10-CM

## 2013-06-07 DIAGNOSIS — M5412 Radiculopathy, cervical region: Secondary | ICD-10-CM

## 2013-06-07 DIAGNOSIS — R51 Headache: Secondary | ICD-10-CM

## 2013-06-07 DIAGNOSIS — G5600 Carpal tunnel syndrome, unspecified upper limb: Secondary | ICD-10-CM

## 2013-06-07 DIAGNOSIS — G5603 Carpal tunnel syndrome, bilateral upper limbs: Secondary | ICD-10-CM

## 2013-06-07 MED ORDER — GABAPENTIN 600 MG PO TABS
1200.0000 mg | ORAL_TABLET | Freq: Three times a day (TID) | ORAL | Status: DC
Start: 2013-06-07 — End: 2013-07-19

## 2013-06-07 NOTE — Progress Notes (Signed)
Subjective:      Patient ID: Ann Patterson is a 63 y.o. female.    HPI  The patient saw cardiology, and given a history of long QT syndrome, was  informed that the amitriptyline was contraindicated.  There therefore, she  has not taken this.  I instructed her to discard the medication.  She had  been on Cymbalta in the past, but felt that this made her more depressed.   She has never been on Savella.  She continues on Neurontin.  Physical  therapy is set up for the near future.     Her MRI of the cervical spine did not show significant changes.          Review of Systems  Constitutional: Negative for fever.   Respiratory: Negative for shortness of breath.    Cardiovascular: Negative for chest pain.   Gastrointestinal: Negative for abdominal pain.   Neurological: Negative for dizziness, numbness.   Psychiatric/Behavioral: Negative for sleep disturbance.       Objective:   Neurologic Exam  Mental Status: The patient was awake, alert, appeared oriented and was appropriate. Speech was fluent and the patient followed commands well. Attention, concentration, and memory appeared intact. Fund of knowledge appeared appropriate for education level.   Cranial Nerves: Extraocular movements were intact without nystagmus. Hearing was intact to conversational speech. Face was symmetric. Tongue appeared midline.   Motor: Moves all extremities well, with slight giveway at times. Bulk appeared normal for body habitus. No obvious tremor noted.   Sensation: light touch was grossly intact.   Coordination: Finger to nose testing was intact without dysmetria.   Gait: Normal and steady.      Assessment:     1. Neck pain    2. Cervical radiculopathy    3. Headache    4. Carpal tunnel syndrome on both sides          Plan:     1.  Continue with Neurontin.  2.  Proceed with physical therapy.    3.  Follow up with pain management when able to.    4.  One option would be to consider changing Neurontin to Lyrica or perhaps  adding that to the  Neurontin.  5.  I will contact her cardiologist, Dr. Thelma Barge, and determine if other  potential medications may be acceptable from his standpoint.  These would  include:  Savella, Cymbalta, Flexeril, Soma, Skelaxin, Zanaflex.    Follow up in 6 - 8 weeks or contact us sooner if needed.    The patient will return for follow-up as outlined above. If there are any problems with medications (if prescribed) or questions about any available test results, or if there are any new symptoms or changes in current symptoms, the patient is instructed to call the office.     Bunny Kleist B. Stacy Gardner, MD  Board Certified, Neurology  Board Certified, Clinical Neurophysiology  Board Certified, Electrodiagnostic Medicine  Board Certified, Vascular Neurology

## 2013-06-07 NOTE — Progress Notes (Signed)
Have you sought care outside of [place] since we last saw you?   No

## 2013-06-12 NOTE — Progress Notes (Signed)
Meds OK  Sorry for delay in getting back to you, just learning the system

## 2013-06-19 ENCOUNTER — Other Ambulatory Visit (INDEPENDENT_AMBULATORY_CARE_PROVIDER_SITE_OTHER): Payer: Self-pay

## 2013-06-19 DIAGNOSIS — R519 Headache, unspecified: Secondary | ICD-10-CM

## 2013-06-19 DIAGNOSIS — M5412 Radiculopathy, cervical region: Secondary | ICD-10-CM

## 2013-06-19 DIAGNOSIS — G5603 Carpal tunnel syndrome, bilateral upper limbs: Secondary | ICD-10-CM

## 2013-06-19 DIAGNOSIS — M542 Cervicalgia: Secondary | ICD-10-CM

## 2013-06-20 ENCOUNTER — Encounter (INDEPENDENT_AMBULATORY_CARE_PROVIDER_SITE_OTHER): Payer: Self-pay

## 2013-06-20 MED ORDER — PREGABALIN 25 MG PO CAPS
ORAL_CAPSULE | ORAL | Status: DC
Start: 2013-06-19 — End: 2013-10-01

## 2013-06-20 NOTE — Progress Notes (Signed)
Prescription Lyrica was mailed to patient's address: [address] [place], [place] [address]. Patient aware.

## 2013-06-28 ENCOUNTER — Inpatient Hospital Stay: Payer: Medicare Other | Attending: Vascular Neurology | Admitting: Rehabilitative and Restorative Service Providers"

## 2013-06-28 DIAGNOSIS — M542 Cervicalgia: Secondary | ICD-10-CM | POA: Insufficient documentation
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
## 2013-07-01 ENCOUNTER — Inpatient Hospital Stay: Payer: Medicare Other | Admitting: Rehabilitative and Restorative Service Providers"

## 2013-07-01 DIAGNOSIS — M542 Cervicalgia: Secondary | ICD-10-CM
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
## 2013-07-05 ENCOUNTER — Inpatient Hospital Stay: Payer: Medicare Other | Admitting: Rehabilitative and Restorative Service Providers"

## 2013-07-05 DIAGNOSIS — M542 Cervicalgia: Secondary | ICD-10-CM
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
## 2013-07-08 ENCOUNTER — Inpatient Hospital Stay: Payer: Medicare Other | Admitting: Rehabilitative and Restorative Service Providers"

## 2013-07-08 DIAGNOSIS — M542 Cervicalgia: Secondary | ICD-10-CM

## 2013-07-08 NOTE — PT/OT Therapy Note (Signed)
DAILY NOTE   07/08/2013     Time In/Out: 1:35 - 2:15 Total Time: 40 min  Visit Number:  4     # of Authorized Visits: 16 Visit #: 4    Diagnosis (Treating/Medical):     ICD-9-CM   1. Neck pain 723.1           Subjective:  Ann Patterson reports "It's kinda hard to feel the same feelings you did since this whole thing began. It's helping. Sometimes I think I feel bad but then after a while I feel more mobile." Pt reports yesterday chest bone was hurting - felt "tight/aching"     Objective:   Treatment:  Therapeutic Exercise:  Attempted UBE for assisted warm up 2 min total - time spent for set up and trial of forward and backward. Pt reports pain in shoulder and arm "like i've worked out really hard" d/c at about 1:42  Axial elongation holds with PT resist, cueing to push into legs and tongue on roof of mouth for irradiation  Discussed pt selected stretching and gave advice reg ways to dec strain through cervical spine    Manual Therapy:   STM suboccipitals with suboccipital release, pec major clearing of mid and prox ribs   Mastoid APs with cervical Rot and flexion, added active axial elongation for FM  In L sidelying: R to L cr shear with CR shoulder Add FM  Supine: posterior glide R C1 with R flexion FM            Current Measurements (ROM, Strength, Girth, Outcomes, etc.):   Upper cervical quadrants:  R flex: hard block  L flex: good mobility    R ext: good mobility  L Ext: good mobility    Assessment (response to treatment):   Pt with improved R flex upper cervical quadrant by end of session. Cont to have ST dysfunction at suboccipital region with dec lengthening/shortenening of ligamentum nunche. Pt reporting some pain from lower neck to mid-low back likely from paraspinals/fascial restrictions.    Progress towards functional goals: improving alignment with upper cervical mobs today     Patient requires continued skilled care to: Cont scapular NMR and suboccipital ST mobilizations to improve ST mobility and facilitate  core stability    Plan:  Continue with Plan of Care  Cont Scap NMR and scap/core strengthening     Joss Friedel B. Sheliah Hatch, PT, DPT  [place] [address]      07/08/2013

## 2013-07-12 ENCOUNTER — Inpatient Hospital Stay: Payer: Medicare Other | Admitting: Rehabilitative and Restorative Service Providers"

## 2013-07-12 DIAGNOSIS — M542 Cervicalgia: Secondary | ICD-10-CM
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
## 2013-07-15 ENCOUNTER — Inpatient Hospital Stay: Payer: Medicare Other | Admitting: Rehabilitative and Restorative Service Providers"

## 2013-07-15 DIAGNOSIS — M542 Cervicalgia: Secondary | ICD-10-CM

## 2013-07-15 NOTE — PT/OT Therapy Note (Signed)
DAILY NOTE   07/15/2013     Time In/Out: 1:30 - 2:11 Total Time: 41 min  Visit Number:  6     # of Authorized Visits: 16 Visit #: 6    Diagnosis (Treating/Medical):     ICD-9-CM   1. Neck pain 723.1           Subjective:  Ann Patterson reports "it shifts back and forth now, the cramping and the spasms. It feels like my head weighs 2000lbs, it aches really bad back here (points to suboccipital space." Pt reporting pain shooting down spine with cervical side bend to either side. Pt reports she needs to starting lyrica tomorrow 1 300mg , 3x/day and today is the last day of gabapentin. Then 325 for 5 days of lyrica.     Billed with MT    Objective:   Treatment:  Therapeutic Exercise:  Myofascial stretching: HO provided.    cervical rot with thoracic rot away.    Cervical rot with weight shift opposite pelvis.     Manual Therapy:   First rib depression in sitting CR sidebend away  T1 rot mob to improve R rot with active cervical rot R in sitting    Supine:   ST clearing of suboccipital space with sustained pressures and active chin tuck    Prone: T 1, 2, 3,  superior lateral rot to improve R cervical rot FM with arm positioning behind back pocket, CR    In sitting: T 1, 2, 3, extension FMs with Hands behind neck               Current Measurements (ROM, Strength, Girth, Outcomes, etc.):   NA    Assessment (response to treatment):   Pt with restriction noted at R splenius capitus affecting apparent C1 R shear - addressed with STM which improved C1 positioning when palpated in neutral supine.   Pt initially with forward head at beginning of session with hard bock when attempting to get neutral cervical alignment in sitting. By end of session following thoracic mobs, able to get mastoid over shoulder alignment.     Progress towards functional goals: --    Patient requires continued skilled care to: Cont scapular NMR and suboccipital ST mobilizations to improve ST mobility and facilitate core stability; facilitate cervical joint  mobility for rotation over R shoulder.    Plan:  Continue with Plan of Care  Progress UE strengthening and lower cervical joint mobility.     Lukasz Rogus B. Sheliah Hatch, PT, DPT  [place] [address]      07/15/2013

## 2013-07-18 ENCOUNTER — Ambulatory Visit (INDEPENDENT_AMBULATORY_CARE_PROVIDER_SITE_OTHER): Payer: Medicare Other | Admitting: Vascular Neurology

## 2013-07-19 ENCOUNTER — Emergency Department: Payer: Medicare Other

## 2013-07-19 ENCOUNTER — Emergency Department
Admission: EM | Admit: 2013-07-19 | Discharge: 2013-07-19 | Disposition: A | Discharge status: Home / Self Care | Payer: Medicare Other | Attending: Emergency Medicine | Admitting: Emergency Medicine

## 2013-07-19 ENCOUNTER — Ambulatory Visit: Payer: Medicare Other | Admitting: Rehabilitative and Restorative Service Providers"

## 2013-07-19 ENCOUNTER — Other Ambulatory Visit: Payer: Self-pay

## 2013-07-19 DIAGNOSIS — R1013 Epigastric pain: Secondary | ICD-10-CM | POA: Insufficient documentation

## 2013-07-19 DIAGNOSIS — F17200 Nicotine dependence, unspecified, uncomplicated: Secondary | ICD-10-CM | POA: Insufficient documentation

## 2013-07-19 DIAGNOSIS — Z8612 Personal history of poliomyelitis: Secondary | ICD-10-CM | POA: Insufficient documentation

## 2013-07-19 DIAGNOSIS — R109 Unspecified abdominal pain: Secondary | ICD-10-CM

## 2013-07-19 DIAGNOSIS — R110 Nausea: Secondary | ICD-10-CM | POA: Insufficient documentation

## 2013-07-19 DIAGNOSIS — Z8661 Personal history of infections of the central nervous system: Secondary | ICD-10-CM | POA: Insufficient documentation

## 2013-07-19 DIAGNOSIS — E785 Hyperlipidemia, unspecified: Secondary | ICD-10-CM | POA: Insufficient documentation

## 2013-07-19 LAB — CBC AND DIFFERENTIAL
Basophils Absolute Automated: 0.01 10*3/uL (ref 0.00–0.20)
Basophils Automated: 0 %
Eosinophils Absolute Automated: 0 10*3/uL (ref 0.00–0.70)
Eosinophils Automated: 0 %
Hematocrit: 43.7 % (ref 37.0–47.0)
Hgb: 14.6 g/dL (ref 12.0–16.0)
Immature Granulocytes Absolute: 0.01 10*3/uL
Immature Granulocytes: 0 %
Lymphocytes Absolute Automated: 0.98 10*3/uL (ref 0.50–4.40)
Lymphocytes Automated: 36 %
MCH: 31.6 pg (ref 28.0–32.0)
MCHC: 33.4 g/dL (ref 32.0–36.0)
MCV: 94.6 fL (ref 80.0–100.0)
MPV: 10.8 fL (ref 9.4–12.3)
Monocytes Absolute Automated: 0.5 10*3/uL (ref 0.00–1.20)
Monocytes: 18 %
Neutrophils Absolute: 1.25 10*3/uL — ABNORMAL LOW (ref 1.80–8.10)
Neutrophils: 45 %
Nucleated RBC: 1 /100 WBC (ref 0–1)
Platelets: 141 10*3/uL (ref 140–400)
RBC: 4.62 10*6/uL (ref 4.20–5.40)
RDW: 15 % (ref 12–15)
WBC: 2.75 10*3/uL — ABNORMAL LOW (ref 3.50–10.80)

## 2013-07-19 LAB — LIPASE: Lipase: 62 U/L (ref 8–78)

## 2013-07-19 LAB — COMPREHENSIVE METABOLIC PANEL
ALT: 19 U/L (ref 0–55)
AST (SGOT): 44 U/L — ABNORMAL HIGH (ref 5–34)
Albumin/Globulin Ratio: 1.1 (ref 0.9–2.2)
Albumin: 3.7 g/dL (ref 3.5–5.0)
Alkaline Phosphatase: 62 U/L (ref 40–150)
BUN: 9 mg/dL (ref 7.0–19.0)
Bilirubin, Total: 0.3 mg/dL (ref 0.2–1.2)
CO2: 24 mEq/L (ref 22–29)
Calcium: 8.7 mg/dL (ref 8.5–10.5)
Chloride: 102 mEq/L (ref 98–107)
Creatinine: 0.9 mg/dL (ref 0.6–1.0)
Globulin: 3.3 g/dL (ref 2.0–3.6)
Glucose: 139 mg/dL — ABNORMAL HIGH (ref 70–100)
Potassium: 4 mEq/L (ref 3.5–5.1)
Protein, Total: 7 g/dL (ref 6.0–8.3)
Sodium: 135 mEq/L — ABNORMAL LOW (ref 136–145)

## 2013-07-19 LAB — URINALYSIS WITH MICROSCOPIC
Bilirubin, UA: NEGATIVE
Blood, UA: NEGATIVE
Glucose, UA: NEGATIVE
Ketones UA: NEGATIVE
Leukocyte Esterase, UA: NEGATIVE
Nitrite, UA: NEGATIVE
Protein, UR: 30 — AB
Specific Gravity UA: 1.02 (ref 1.001–1.035)
Urine pH: 6 (ref 5.0–8.0)
Urobilinogen, UA: NORMAL mg/dL

## 2013-07-19 LAB — I-STAT CG4 VENOUS CARTRIDGE
Lactic Acid I-Stat: 1.2 mEq/L (ref 0.5–2.2)
i-STAT Base Excess Venous: 2 mEq/L
i-STAT FIO2: 21
i-STAT HCO3 Bicarbonate Venous: 27.6 mEq/L
i-STAT O2 Saturation Venous: 54 %
i-STAT Patient Temperature: 98.7
i-STAT Total CO2 Venous: 29 mEq/L
i-STAT pCO2 Venous: 47
i-STAT pH Venous: 7.377
i-STAT pO2 Venous: 30

## 2013-07-19 LAB — TSH: TSH: 1.54 u[IU]/mL (ref 0.35–4.94)

## 2013-07-19 LAB — GFR: EGFR: >60

## 2013-07-19 MED ORDER — HYDROMORPHONE HCL PF 1 MG/ML IJ SOLN
0.5000 mg | Freq: Once | INTRAMUSCULAR | Status: AC
Start: 2013-07-19 — End: 2013-07-19
  Administered 2013-07-19: 0.5 mg via INTRAVENOUS
  Filled 2013-07-19: qty 1

## 2013-07-19 MED ORDER — OXYCODONE-ACETAMINOPHEN 5-325 MG PO TABS
ORAL_TABLET | ORAL | 0 refills | Status: DC
Start: 2013-07-19 — End: 2013-10-01
  Filled 2013-07-19: qty 20, 3d supply, fill #0

## 2013-07-19 MED ORDER — ONDANSETRON 4 MG PO TBDP
4.0000 mg | ORAL_TABLET | Freq: Four times a day (QID) | ORAL | 0 refills | Status: DC | PRN
Start: 2013-07-19 — End: 2013-10-01
  Filled 2013-07-19: qty 16, 4d supply, fill #0

## 2013-07-19 MED ORDER — BISMUTH SUBSALICYLATE 262 MG PO CHEW
1.0000 | CHEWABLE_TABLET | Freq: Once | ORAL | Status: AC
Start: 2013-07-19 — End: 2013-07-19
  Administered 2013-07-19: 262 mg via ORAL
  Filled 2013-07-19: qty 1

## 2013-07-19 MED ORDER — SODIUM CHLORIDE 0.9 % IV BOLUS
1000.0000 mL | Freq: Once | INTRAVENOUS | Status: AC
Start: 2013-07-19 — End: 2013-07-19
  Administered 2013-07-19: 1000 mL via INTRAVENOUS

## 2013-07-19 MED ORDER — ONDANSETRON HCL 4 MG/2ML IJ SOLN
4.0000 mg | Freq: Once | INTRAMUSCULAR | Status: AC
Start: 2013-07-19 — End: 2013-07-19
  Administered 2013-07-19: 4 mg via INTRAVENOUS
  Filled 2013-07-19: qty 2

## 2013-07-19 MED ORDER — ESOMEPRAZOLE MAGNESIUM 20 MG PO CPDR
20.0000 mg | DELAYED_RELEASE_CAPSULE | Freq: Every morning | ORAL | 0 refills | Status: AC
Start: 2013-07-19 — End: 2013-08-18
  Filled 2013-07-19: qty 30, 30d supply, fill #0

## 2013-07-19 NOTE — ED Provider Notes (Signed)
Physician/Midlevel provider first contact with patient: 07/19/13 1016         History     Chief Complaint   Patient presents with   . Abdominal Pain     Patient is a 63 y.o. female presenting with abdominal pain.   Abdominal Pain  The primary symptoms of the illness include abdominal pain and fever.   Additional symptoms associated with the illness include chills.   Patient is a 63 year old woman presenting with general feelings of illness since Monday.  She reports generalized fatigue with decreased exercise tolerance and decreased PO intake secondary to early satiety.  She also reports epigastric pain, located principally over the epigastrum. She is passing flatus and had a bowel movement this morning.  She denies hematochezia.  She has not had emesis, but she endorses subjective nausea. She has an associated cough that has been non-productive that she describes as an irritation.  She reports fevers and chills with a maximum recorded home temp of 103.7.  Her symptoms have been persistent since Monday, and she decided to come in when they were not improving.  She has taken APAP with mild improvement in symptoms.  She has not taken any this morning.    Past Medical History   Diagnosis Date   . Meningitis spinal    . Polio    . Vertigo    . Hyperlipidemia        Past Surgical History   Procedure Date   . Hysterectomy    . Appendectomy    . Carpal tunnel release    . Back surgery    . Fracture surgery    . Repair, hand, nerve 12/10/2012     Procedure: REPAIR, HAND, NERVE;  Surgeon: Kellie Simmering, MD;  Location: [facility];  Service: Plastics;  Laterality: Left;  LEFT SMALL FINGER EXPLORATION; REPAIR DIGITAL NERVE       Family History   Problem Relation Age of Onset   . Breast cancer Neg Hx    . Myocardial Infarction Mother    . Diverticulitis Sister    . Diabetes Maternal Grandmother    . Brain cancer Maternal Grandfather        Social  History   Substance Use Topics   . Smoking status: Current Every Day Smoker  -- 0.5 packs/day     Types: Cigarettes   . Smokeless tobacco: Not on file   . Alcohol Use: No       .     Allergies   Allergen Reactions   . Gadolinium    . Iodine    . Penicillins        Current/Home Medications    ASPIRIN 81 MG TABLET    Take 81 mg by mouth daily.      FISH OIL-OMEGA-3 FATTY ACIDS 1000 MG CAPSULE    Take 1 g by mouth daily.      PREGABALIN (LYRICA) 25 MG CAPSULE    Start with 1 capsule (25mg ) twice a day for 5 days, then 2 capsules (50mg ) twice a day for 5 days, then 3 capsules (75mg ) twice a day.    RALOXIFENE HCL (EVISTA PO)    Take 120 mg by mouth daily.          Review of Systems   Constitutional: Positive for fever, chills, activity change and appetite change.   HENT: Positive for postnasal drip.    Eyes: Negative for visual disturbance.   Respiratory: Positive for cough.  Cardiovascular: Negative for chest pain and leg swelling.   Gastrointestinal: Positive for abdominal pain.   Genitourinary: Positive for flank pain.   Musculoskeletal: Positive for arthralgias.   Neurological: Positive for numbness.   Hematological: Negative for adenopathy.   Psychiatric/Behavioral: Negative.        Physical Exam    BP: 119/68 mmHg, Heart Rate: 64 , Temp: 98.7 F (37.1 C), Resp Rate: 18 , SpO2: 98 %, Weight: 43.092 kg    Physical Exam   Constitutional: She is oriented to person, place, and time.        63 year old woman, appears older than stated age   HENT:   Head: Normocephalic and atraumatic.   Eyes: Conjunctivae normal are normal. Pupils are equal, round, and reactive to light.   Neck: Normal range of motion. Neck supple.   Cardiovascular: Normal rate and regular rhythm.    Pulmonary/Chest:        Coarse wheeze heard in the right lower lung field   Abdominal:        Non-distended  No Peritoneal signs  Tenderness to palpation in all quadrants and suprapubically.     Musculoskeletal: She exhibits no edema and no tenderness.   Lymphadenopathy:     She has no cervical adenopathy.   Neurological: She is  alert and oriented to person, place, and time.       MDM and ED Course     ED Medication Orders      Start     Status Ordering Provider    07/19/13 1430   bismuth subsalicylate (PEPTO BISMOL) chewable tablet 262 mg   Once      Route: Oral  Ordered Dose: 1 tablet         Last MAR action:  Given Loren Racer A    07/19/13 1108   HYDROmorphone (DILAUDID) injection 0.5 mg   Once in ED      Route: Intravenous  Ordered Dose: 0.5 mg         Last MAR action:  Given MEHAN, NANCY    07/19/13 1108   ondansetron (ZOFRAN) injection 4 mg   Once      Route: Intravenous  Ordered Dose: 4 mg         Last MAR action:  Given MEHAN, NANCY    07/19/13 1108   sodium chloride 0.9 % bolus 1,000 mL   Once      Route: Intravenous  Ordered Dose: 1,000 mL         Last MAR action:  Stopped MEHAN, NANCY                 MDM  Number of Diagnoses or Management Options  Diagnosis management comments: 63 year old woman with diffuse symptoms.  Many possible etiologies.  Given her profound smoking history, malignancy is a possibility.  Given her reported B symptoms hematologic malignancies possible.  Infection also a possibility, though patient currently meets no SIRS criteria.  Will order basic labs, TSH, lactate, Lipase, and blood cultures and CXR.    Initial work up unremarkable except for mild neutropenia.  Patient feeling better.  Will get CT.   with PO contrast due to the patient's presenting suprapubic tenderness.    CT normal.  Will discharge patient with symptomatic treatment for nausea, and pain       Amount and/or Complexity of Data Reviewed  Clinical lab tests: reviewed  Tests in the radiology section of CPT: reviewed  Procedures    Clinical Impression & Disposition     Clinical Impression  Final diagnoses:   Abdominal pain   Nausea        ED Disposition     Discharge LONNA RABOLD discharge to home/self care.    Condition at disposition: Stable             New Prescriptions    ESOMEPRAZOLE (NEXIUM) 20 MG CAPSULE    Take 1  capsule (20 mg total) by mouth every morning before breakfast.    ONDANSETRON (ZOFRAN ODT) 4 MG DISINTEGRATING TABLET    Dissolve 1 tablet (4 mg total) by mouth every 6 (six) hours as needed for Nausea.    OXYCODONE-ACETAMINOPHEN (PERCOCET) 5-325 MG PER TABLET    Take 1-2 tablets by mouth every 4-6 hours as needed for pain;  Do not drive or operate machinery while taking this medicine                 Apolinar Junes, MD  Resident  07/19/13 1122    Apolinar Junes, MD  Resident  07/19/13 1254    Apolinar Junes, MD  Resident  07/19/13 1614    Apolinar Junes, MD  Resident  07/19/13 [phone]

## 2013-07-19 NOTE — ED Provider Notes (Signed)
EMERGENCY DEPARTMENT HISTORY AND PHYSICAL EXAM      Date: 07/19/2013  Patient Name: Ann Patterson  Attending Physician: Orlinda Blalock, MD      ATTENDING NOTE:    I have reviewed and agree with history. I have seen and examined this patient. My pertinent physical exam has been documented. I was present for key portions of any procedures performed. The patient was seen and examined by the PA or resident. The plan of care was discussed with me.     Diagnosis and Treatment Plan       Clinical Impression:   1. Abdominal pain    2. Nausea        Treatment Plan:   ED Disposition     Discharge Ann Patterson discharge to home/self care.    Condition at disposition: Stable            History of Presenting Illness     Chief Complaint   Patient presents with   . Abdominal Pain       Ann Patterson is a 63 y.o. female with h/o polio, hld who presents with abdominal pain, fever, and headache since four days ago. Headache is at base of occiput. Fever subjective at home. S/p appendectomy and hysterectomy, no other abdominal surgeries.   +cough.     Duration: five days  Onset: gradual  Quality: aching  Maximum Severity: moderate  Modifying Factors: minimal relief with rest  History Obtained From: patient    PCP: Mart Piggs, MD    Past Medical History     Past Medical History   Diagnosis Date   . Meningitis spinal    . Polio    . Vertigo    . Hyperlipidemia      Past Surgical History   Procedure Date   . Hysterectomy    . Appendectomy    . Carpal tunnel release    . Back surgery    . Fracture surgery    . Repair, hand, nerve 12/10/2012     Procedure: REPAIR, HAND, NERVE;  Surgeon: Kellie Simmering, MD;  Location: [facility];  Service: Plastics;  Laterality: Left;  LEFT SMALL FINGER EXPLORATION; REPAIR DIGITAL NERVE       Family History     Family History   Problem Relation Age of Onset   . Breast cancer Neg Hx    . Myocardial Infarction Mother    . Diverticulitis Sister    . Diabetes Maternal Grandmother    . Brain  cancer Maternal Grandfather        Social History     History     Social History   . Marital Status: Single     Spouse Name: N/A     Number of Children: N/A   . Years of Education: N/A     Social History Main Topics   . Smoking status: Current Every Day Smoker -- 0.5 packs/day     Types: Cigarettes   . Smokeless tobacco: Not on file   . Alcohol Use: No   . Drug Use: No   . Sexually Active: Not on file     Other Topics Concern   . Not on file     Social History Narrative   . No narrative on file       Allergies     Allergies   Allergen Reactions   . Gadolinium    . Iodine    . Penicillins  Medications     Current facility-administered medications:[COMPLETED] bismuth subsalicylate (PEPTO BISMOL) chewable tablet 262 mg, 1 tablet, Oral, Once, Apolinar Junes, MD, 262 mg at 07/19/13 1410;  [COMPLETED] HYDROmorphone (DILAUDID) injection 0.5 mg, 0.5 mg, Intravenous, Once in ED, Orlinda Blalock, MD, 0.5 mg at 07/19/13 1117;  [COMPLETED] ondansetron (ZOFRAN) injection 4 mg, 4 mg, Intravenous, Once, Orlinda Blalock, MD, 4 mg at 07/19/13 1117  [COMPLETED] sodium chloride 0.9 % bolus 1,000 mL, 1,000 mL, Intravenous, Once, Orlinda Blalock, MD, 1,000 mL at 07/19/13 1119  Current outpatient prescriptions:aspirin 81 MG tablet, Take 81 mg by mouth daily.  , Disp: , Rfl: ;  esomeprazole (NEXIUM) 20 MG capsule, Take 1 capsule (20 mg total) by mouth every morning before breakfast., Disp: 30 capsule, Rfl: 0;  fish oil-omega-3 fatty acids 1000 MG capsule, Take 1 g by mouth daily.  , Disp: , Rfl:   ondansetron (ZOFRAN ODT) 4 MG disintegrating tablet, Dissolve 1 tablet (4 mg total) by mouth every 6 (six) hours as needed for Nausea., Disp: 16 tablet, Rfl: 0;  oxyCODONE-acetaminophen (PERCOCET) 5-325 MG per tablet, Take 1-2 tablets by mouth every 4-6 hours as needed for pain;  Do not drive or operate machinery while taking this medicine, Disp: 20 tablet, Rfl: 0  pregabalin (LYRICA) 25 MG capsule, Start with 1 capsule (25mg ) twice a day for 5  days, then 2 capsules (50mg ) twice a day for 5 days, then 3 capsules (75mg ) twice a day., Disp: 180 capsule, Rfl: 3;  Raloxifene HCl (EVISTA PO), Take 120 mg by mouth daily.  , Disp: , Rfl: ;  [DISCONTINUED] clindamycin (CLEOCIN) 300 MG capsule, , Disp: , Rfl:   [DISCONTINUED] gabapentin (NEURONTIN) 600 MG tablet, Take 2 tablets (1,200 mg total) by mouth 3 (three) times daily., Disp: 180 tablet, Rfl: 5    Review of Systems     Review of Systems : All systems were reviewed and are negative for acute complaints, except as described above.     Physical Exam   BP 103/63  Pulse 54  Temp 98.7 F (37.1 C)  Resp 18  Ht 1.549 m  Wt 43.092 kg  BMI 17.96 kg/m2  SpO2 94%    Constitutional: Patient is afebrile. Vital signs reviewed. Well appearing.   Head: Atraumatic. Normocephalic.   Eyes: Eyes are normal to inspection. Sclera are nl.   Respiratory/Chest: Breath sounds are normal. No respiratory distress.  Cardiovascular: RRR. Hearts sounds normal. Normal S1 S2.  Abdomen: suprapubic ttp. Bilateral lower quadrant ttp (mostly on left). Significant epigastric ttp. Left CVAT. No peritoneal signs.  Upper Extremity: Inspection normal. No cyanosis.  Lower Extremity: Inspection normal. No edema  Neuro: GCS is 15. Speech normal.  Skin: Skin is warm. Skin is dry.  Psychiatric: Oriented X 3. Normal affect.       Diagnostic Study Results     Labs -  Results     Procedure Component Value Units Date/Time    Blood culture #1 [[id number]] Collected:07/19/13 1120    Specimen Information:Blood / Blood Updated:07/19/13 1358    Narrative:    1 BLUE+1 PURPLE    Blood culture #2 [[id number]] Collected:07/19/13 1157    Specimen Information:Blood / Blood Updated:07/19/13 1358    Narrative:    1 BLUE+1 PURPLE    Urine culture [[id number]] Collected:07/19/13 1120    Specimen Information:Urine / Urine, Clean Catch Updated:07/19/13 1340    LAB-UA with Micro [[id number]]  (Abnormal) Collected:07/19/13 1120    Specimen Information:Urine Updated:07/19/13  1210  Urine Type Clean Catch      Color, UA Yellow      Clarity, UA Clear      Specific Gravity UA 1.020      Urine pH 6.0      Leukocyte Esterase, UA Negative      Nitrite, UA Negative      Protein, UR 30 (A)      Glucose, UA Negative      Ketones UA Negative      Urobilinogen, UA Normal mg/dL      Bilirubin, UA Negative      Blood, UA Negative      RBC, UA 0 - 5 /hpf      WBC, UA 0 - 5 /hpf      Squamous Epithelial Cells, Urine 0 - 5 /hpf      Renal Epithel, UA 0 - 2 /hpf     i-Stat CG4 Venous CartrIDge [[id number]] Collected:07/19/13 1151     i-STAT pH Venous 7.377 Updated:07/19/13 1156     i-STAT pCO2 Venous 47.0      i-STAT pO2 Venous 30.0      i-STAT HCO3 Bicarbonate Venous 27.6 mEq/L      i-STAT Total CO2 Venous 29.0 mEq/L      i-STAT Base Excess Venous 2.0 mEq/L      i-STAT O2 Saturation Venous 54.0 %      i-STAT Lactic acid 1.2 mEq/L      i-STAT Patient Temperature 98.7      i-STAT FIO2 21      i-STAT O2 Delivery Room Air      i-STAT Allen's Test NA      i-STAT Draw Site Venous     LAB-TSH [[id number]] Collected:07/19/13 1052    Specimen Information:Blood Updated:07/19/13 1150     Thyroid Stimulating Hormone 1.54 uIU/mL     Comprehensive Metabolic Panel (CMP) [[id number]]  (Abnormal) Collected:07/19/13 1052    Specimen Information:Blood Updated:07/19/13 1128     Glucose 139 (H) mg/dL      BUN 9.0 mg/dL      Creatinine 0.9 mg/dL      Sodium 401 (L) mEq/L      Potassium 4.0 mEq/L      Chloride 102 mEq/L      CO2 24 mEq/L      CALCIUM 8.7 mg/dL      Protein, Total 7.0 g/dL      Albumin 3.7 g/dL      AST (SGOT) 44 (H) U/L      ALT 19 U/L      Alkaline Phosphatase 62 U/L      Bilirubin, Total 0.3 mg/dL      Globulin 3.3 g/dL      Albumin/Globulin Ratio 1.1     LAB-Lipase [[id number]] Collected:07/19/13 1052    Specimen Information:Blood Updated:07/19/13 1128     Lipase 62 U/L     GFR [[id number]] Collected:07/19/13 1052     EGFR >60.0 Updated:07/19/13 1128    CBC and Differential [[id number]]  (Abnormal)  Collected:07/19/13 1052    Specimen Information:Blood / Blood Updated:07/19/13 1106     WBC 2.75 (L) x10 3/uL      RBC 4.62 x10 6/uL      Hgb 14.6 g/dL      Hematocrit 32.9 %      MCV 94.6 fL      MCH 31.6 pg      MCHC 33.4 g/dL      RDW 15 %      Platelets 141 x10 3/uL  MPV 10.8 fL      Neutrophils 45 %      Lymphocytes Automated 36 %      Monocytes 18 %      Eosinophils Automated 0 %      Basophils Automated 0 %      Immature Granulocyte 0 %      Nucleated RBC 1 /100 WBC      Neutrophils Absolute 1.25 (L) x10 3/uL      Abs Lymph Automated 0.98 x10 3/uL      Abs Mono Automated 0.50 x10 3/uL      Abs Eos Automated 0.00 x10 3/uL      Absolute Baso Automated 0.01 x10 3/uL      Absolute Immature Granulocyte 0.01 x10 3/uL           Radiologic Studies -  XR CHEST 2 VIEWS    Final Result:   Negative for acute process          Gerlene Burdock, MD     07/19/2013 1:00 PM       CT ABDOMEN PELVIS WO IV/ W PO CONT    Final Result:  Trace pelvic free fluid.         Bosie Helper, MD     07/19/2013 3:52 PM             Clinical Course in the Emergency Department     Differential: UTI, ischemic colitis unlikely based on presentation, cholecysitits, low clicnical suspicion for acute surgical abdomen based on history and presentation.     Reassess prior to discharge: Patient re-evaluated. Looks well. Pain resolved. abd soft nt/nd, pt feeling better, tolerating PO, Abd pain precautions given.     MDM: the patient presents with abdominal pain without signs of peritonitis or other life threatening or serious etiology. Pt appears stable for discharge and has been instructed to return immediately if the symptoms worsen in any way, or in 8-12 hours if not improved for re-evaluation. The patient has been instructed to return in the symptoms worsen or change in any way.         Medical Decision Making     Amount and/or Complexity of Data Reviewed  First MD: Orlinda Blalock, MD  I am the first provider for this patient  I have reviewed the vital  signs, past medical hx, past surgical hx, social hx, family hx.  Clinical lab tests ordered and reviewed by myself: yes  All lab results interpreted by myself: yes  All radiology ordered and reviewed by myself: yes  All radiologic studies interpreted by myself: yes  Independent visualization of images, tracings, or specimens: yes  Discuss the patient with other providers: yes  Obtained patient's old records: yes  Nursing notes reviewed: yes  O2 sat reviewed : normal  _______________________________    Attestations:    I was acting as a scribe for Orlinda Blalock, MD on [employer]   I am the first provider for this patient and I personally performed the services documented. Hosie Spangle is scribing for me on [employer]. This note accurately reflects work and decisions made by me.  Orlinda Blalock, MD      _______________________________          Orlinda Blalock, MD  07/28/13 2227

## 2013-07-19 NOTE — ED Notes (Signed)
DR. Loma Boston AT BEDSIDE.

## 2013-07-19 NOTE — ED Notes (Signed)
ISTAT LACTIC 1.21

## 2013-07-19 NOTE — ED Notes (Signed)
Bed:N J<BR> Expected date:<BR> Expected time:<BR> Means of arrival:<BR> Comments:<BR>

## 2013-07-19 NOTE — ED Notes (Signed)
GREENWALD, RESIDENT AT PT BEDSIDE.

## 2013-07-19 NOTE — ED Notes (Signed)
Bed:N 24<BR> Expected date:<BR> Expected time:<BR> Means of arrival:<BR> Comments:<BR>

## 2013-07-19 NOTE — ED Notes (Signed)
CT tech at bedside to administer oral contrast (barium sulfate).

## 2013-07-19 NOTE — Discharge Instructions (Signed)
Industry Referral Line    You have been referred to a primary care doctor or a specialist for follow-up care. Please call the [facility] referral line and they will be able to help you find a physician in your area that you can follow up with.    Phone: [phone] or [phone]    Please get a primary care provider and follow up about your symptoms of abdominal pain and fatigue.

## 2013-07-20 MED ORDER — IOHEXOL 240 MG/ML IJ SOLN
50.0000 mL | Freq: Once | INTRAMUSCULAR | Status: AC | PRN
Start: 2013-07-20 — End: 2013-07-20
  Administered 2013-07-20: 50 mL via ORAL

## 2013-07-22 ENCOUNTER — Ambulatory Visit: Payer: Medicare Other | Admitting: Rehabilitative and Restorative Service Providers"

## 2013-07-26 ENCOUNTER — Ambulatory Visit: Payer: Medicare Other | Admitting: Rehabilitative and Restorative Service Providers"

## 2013-07-29 ENCOUNTER — Ambulatory Visit: Payer: Medicare Other | Admitting: Rehabilitative and Restorative Service Providers"

## 2013-08-02 ENCOUNTER — Ambulatory Visit: Payer: Medicare Other | Admitting: Rehabilitative and Restorative Service Providers"

## 2013-08-05 ENCOUNTER — Ambulatory Visit: Payer: Medicare Other | Admitting: Rehabilitative and Restorative Service Providers"

## 2013-08-06 ENCOUNTER — Ambulatory Visit (INDEPENDENT_AMBULATORY_CARE_PROVIDER_SITE_OTHER): Payer: Medicare Other | Admitting: Vascular Neurology

## 2013-08-06 ENCOUNTER — Encounter (INDEPENDENT_AMBULATORY_CARE_PROVIDER_SITE_OTHER): Payer: Self-pay | Admitting: Vascular Neurology

## 2013-08-06 VITALS — BP 92/59 | HR 79 | Ht 61.0 in

## 2013-08-06 DIAGNOSIS — R51 Headache: Secondary | ICD-10-CM

## 2013-08-06 DIAGNOSIS — M5412 Radiculopathy, cervical region: Secondary | ICD-10-CM

## 2013-08-06 DIAGNOSIS — G5603 Carpal tunnel syndrome, bilateral upper limbs: Secondary | ICD-10-CM

## 2013-08-06 DIAGNOSIS — G5600 Carpal tunnel syndrome, unspecified upper limb: Secondary | ICD-10-CM

## 2013-08-06 DIAGNOSIS — M542 Cervicalgia: Secondary | ICD-10-CM

## 2013-08-06 NOTE — Progress Notes (Signed)
Subjective:      Patient ID: Ann Patterson is a 63 y.o. female.    HPI  The patient transitioned to Lyrica about a month ago, and says that it does  help with her pain.  However, she was having side effects which she relates  to medication including nausea, motion sickness, and then began having  difficulty climbing up stairs with some dyspnea.  She is currently on 75 mg  b.i.d.  She also had to stop physical therapy because of insurance reasons,  but says that this was helping.  She tells me that her case may go to  settlement in the next month or so.        Review of Systems  Constitutional: Negative for fever.   Respiratory: Negative for shortness of breath.    Cardiovascular: Negative for chest pain.   Gastrointestinal: Negative for abdominal pain.   Neurological: Negative for dizziness, weakness, numbness and headaches.   Psychiatric/Behavioral: Negative for sleep disturbance.       Objective:   Neurologic Exam  Mental Status: The patient was awake, alert, appeared oriented and was appropriate. Speech was fluent and the patient followed commands well. Attention, concentration, and memory appeared intact. Fund of knowledge appeared appropriate for education level.   Cranial Nerves: Pupils were equally round and reactive to light bilaterally. Extraocular movements were intact without nystagmus. Hearing was intact to conversational speech. Face was symmetric. Tongue appeared midline.   Motor: Good/full strength throughout without clear focality or weakness. Bulk appeared normal for body habitus. No obvious tremor noted.   Sensation: light touch was reduced in hands.   Coordination: Finger to nose testing was intact without dysmetria.   Gait: Normal and steady.      Assessment:     1. Neck pain    2. Headache    3. Cervical radiculopathy    4. Carpal tunnel syndrome on both sides          Plan:     1.  We discussed reducing the Lyrica 50 mg b.i.d. or even less, to see if  her other symptoms or potential side  effects resolved.    2.  I suggest that she also discuss some of the side effects with her  primary care doctor to rule out a medical process.  3.  Resume physical therapy in the near future if able to  4.  Follow up with me in about 6 to 8 weeks or contact the office sooner if  needed.    The patient will return for follow-up as outlined above. If there are any problems with medications (if prescribed) or questions about any available test results, or if there are any new symptoms or changes in current symptoms, the patient is instructed to call the office.     Naudia Crosley B. Stacy Gardner, MD  Board Certified, Neurology  Board Certified, Clinical Neurophysiology  Board Certified, Electrodiagnostic Medicine  Board Certified, Vascular Neurology

## 2013-08-09 ENCOUNTER — Ambulatory Visit: Payer: Medicare Other | Admitting: Rehabilitative and Restorative Service Providers"

## 2013-08-12 ENCOUNTER — Ambulatory Visit: Payer: Medicare Other | Admitting: Rehabilitative and Restorative Service Providers"

## 2013-08-15 ENCOUNTER — Ambulatory Visit: Payer: Medicare Other | Admitting: Rehabilitative and Restorative Service Providers"

## 2013-08-19 ENCOUNTER — Ambulatory Visit: Payer: Medicare Other | Admitting: Rehabilitative and Restorative Service Providers"

## 2013-09-09 ENCOUNTER — Encounter (INDEPENDENT_AMBULATORY_CARE_PROVIDER_SITE_OTHER): Payer: Self-pay | Admitting: Vascular Neurology

## 2013-09-16 ENCOUNTER — Emergency Department
Admission: EM | Admit: 2013-09-16 | Discharge: 2013-09-16 | Disposition: A | Discharge status: Home / Self Care | Payer: Medicare Other | Attending: Emergency Medicine | Admitting: Emergency Medicine

## 2013-09-16 ENCOUNTER — Emergency Department: Payer: Medicare Other

## 2013-09-16 DIAGNOSIS — L03211 Cellulitis of face: Secondary | ICD-10-CM | POA: Insufficient documentation

## 2013-09-16 DIAGNOSIS — L0390 Cellulitis, unspecified: Secondary | ICD-10-CM

## 2013-09-16 DIAGNOSIS — F17200 Nicotine dependence, unspecified, uncomplicated: Secondary | ICD-10-CM | POA: Insufficient documentation

## 2013-09-16 DIAGNOSIS — W57XXXA Bitten or stung by nonvenomous insect and other nonvenomous arthropods, initial encounter: Secondary | ICD-10-CM

## 2013-09-16 DIAGNOSIS — L0201 Cutaneous abscess of face: Secondary | ICD-10-CM | POA: Insufficient documentation

## 2013-09-16 DIAGNOSIS — Z8661 Personal history of infections of the central nervous system: Secondary | ICD-10-CM | POA: Insufficient documentation

## 2013-09-16 DIAGNOSIS — E785 Hyperlipidemia, unspecified: Secondary | ICD-10-CM | POA: Insufficient documentation

## 2013-09-16 DIAGNOSIS — R21 Rash and other nonspecific skin eruption: Secondary | ICD-10-CM | POA: Insufficient documentation

## 2013-09-16 DIAGNOSIS — Z880 Allergy status to penicillin: Secondary | ICD-10-CM | POA: Insufficient documentation

## 2013-09-16 MED ORDER — DIPHENHYDRAMINE HCL 25 MG PO TABS
25.0000 mg | ORAL_TABLET | Freq: Four times a day (QID) | ORAL | Status: DC | PRN
Start: 2013-09-16 — End: 2013-10-01

## 2013-09-16 MED ORDER — DIPHENHYDRAMINE HCL 50 MG/ML IJ SOLN
25.0000 mg | Freq: Once | INTRAMUSCULAR | Status: AC
Start: 2013-09-16 — End: 2013-09-16
  Administered 2013-09-16: 25 mg via INTRAMUSCULAR
  Filled 2013-09-16: qty 1

## 2013-09-16 NOTE — Discharge Instructions (Signed)
Wash everything and avoid repeat exposure    Have someone come to examine your home for possible bed bugs if you had them in past    Return to the ER for worsening    Follow up with your doctor and there dermatologist as needed      Cellulitis    You were diagnosed with cellulitis.    This is a bacterial infection of the skin. Symptoms are usually redness, swelling, and warmth in the affected area. Some people get a fever (temperature higher than 100.9F / 38C) with this infection.    Keep the extremity (arm or leg) above your heart level if possible.    Cellulitis is treated with antibiotics. It is also treated by keeping the affected area elevated (up). Sometimes, antibiotics are given intravenously ("IV"). Other infections can be treated with oral (by mouth) medicines.    Redness, swelling, warmth, and fever should start to get better after 2-3 days of treatment. Come back here or go to the nearest Emergency Department or your primary doctor for a re-check as directed.    YOU SHOULD SEEK MEDICAL ATTENTION IMMEDIATELY, EITHER HERE OR AT THE NEAREST EMERGENCY DEPARTMENT, IF ANY OF THE FOLLOWING OCCURS:   Redness spreads even with treatment. You can mark the infection area with a pen. This will help watch for improvement or spreading.   Fever (temperature higher than 100.9F / 38C) doesn t go away or gets worse after 2-3 days of antibiotics.   Unusual or increasing pain in the infected area.   Lightheadedness.   Feeling sicker at any time or not getting better as expected.             Rash, Nonspecific    You have been seen today for a rash.    The rash does not have a specific appearance or cause that would let the medical staff give a definite diagnosis or treatment now.    There are many causes for rashes to appear on the skin. The medical staff will have talked about some of the many causes. Some causes may be: Allergic reactions, chemical irritation of the skin or infections. A rash alone with  no other symptoms is rarely harmful.    If you have some itching you may try an oral (by mouth) antihistamine like diphenhydramine (Benadryl). This is available over the counter (without prescription). Follow the directions and precautions on the medicine packaging.    Follow up with your family doctor or clinic for reevaluation of the rash if it does not clear up in two or three days.    Sometimes, you will need to see a Dermatologist (a skin specialist doctor) for help finding the cause of the rash.    It is OK for you to go home now.    Even though it may be hard, try not to scratch the affected area. A cool wet cloth can help relieve itching and keep you from scratching.    YOU SHOULD SEEK MEDICAL ATTENTION IMMEDIATELY, EITHER HERE OR AT THE NEAREST EMERGENCY DEPARTMENT, IF ANY OF THE FOLLOWING OCCURS:   Rash spreads or gets worse.   Severe itching, pain or burning in the skin.   Blistering, bruising or bleeding skin.   Fever (temperature higher than 100.9F / 38C), headache, vomiting (throwing up).   Any new symptoms which are of concern to you.   Any signs of infection in the skin develop. This includes more redness, pain, pus drainage, fevers (temperature higher than 100.9F /  38C) or swelling.       Insect Bite    You have been seen today after an insect bite.    Most insect bites don't cause anything more than a small reaction. This reaction could be pain, itching or redness at the site of the bite.     Today your bite does not look infected. Follow the instructions for caring for your wound. However, the bite may still get infected.    Most bites can be managed with simple over the counter remedies such as Caladryl (calamine). You may also use a lotion and a dressing if the bite is oozing.    For multiple bites, use Benadryl oral (by mouth) medication for itching. This can be more effective than trying to treat each individual bite with a topical (surface) lotion or cream.    Keep  the bite area clean and dry. Wash it twice a day with soap and water. Put an antibiotic cream (Neosporin or Polysporin) on the area after you wash it.    YOU SHOULD SEEK MEDICAL ATTENTION IMMEDIATELY, EITHER HERE OR AT THE NEAREST EMERGENCY DEPARTMENT, IF ANY OF THE FOLLOWING OCCURS:   You see worsening redness or swelling or the pain gets worse.   You have fever (temperature higher than 100.66F / 38C).   There are red streaks going up the arm or leg.   The wound smells bad or has a lot of drainage.   You have pain when moving the area   You have swollen lymph nodes or fever and chills.   You have abdominal (belly) pain or are vomiting.    If you can t follow up with your doctor, or if at any time you feel you need to be rechecked or seen again, come back here or go to the nearest emergency department.

## 2013-09-16 NOTE — ED Provider Notes (Signed)
Attending Note:   The patient was seen and examined by the mid-level provider, Gaynelle Arabian, and the plan of care was discussed with me. I agree with the plan as it was presented to me.   i have seen and examined this 63 y.o. WF who p/w pruritis and increasing papules across extremities and upper trunk  + exposure to outdoors several days ago  No fevers  ? Insect bite  Exam: NAD  Heent: WNL  Chest: CTA bilat  Skin: isolated papules upper ext  And dry abrasion to the inferior chin  Imp: ? Contact dermatitis vs. Insect bites, etc..  Vara Guardian, MD 2:03 PM      Vara Guardian, MD  09/16/13 [phone]

## 2013-09-18 NOTE — ED Provider Notes (Signed)
EMERGENCY DEPARTMENT HISTORY AND PHYSICAL EXAM    Date: 09/18/2013  Patient Name: Ann Patterson, Ann Patterson  Midlevel Provider: Gaynelle Arabian, NP  Diagnosis and Treatment Plan       Presumptive Diagnosis:   1. Rash    2. Insect bites    3. Cellulitis         Treatment Plan: home, see patient instructions for treatment and plan    History of Presenting Illness     Chief Complaint:   Chief Complaint   Patient presents with   . Abrasion     Ann Patterson is a 63 y.o. female who  has a past medical history of Meningitis spinal; Polio; Vertigo; and Hyperlipidemia. c/o .         PCP: Mart Piggs, MD      Past Medical History     Past Medical History   Diagnosis Date   . Meningitis spinal    . Polio    . Vertigo    . Hyperlipidemia        Past Surgical History   Procedure Laterality Date   . Hysterectomy     . Appendectomy     . Carpal tunnel release     . Back surgery     . Fracture surgery     . Repair, hand, nerve  12/10/2012     Procedure: REPAIR, HAND, NERVE;  Surgeon: Kellie Simmering, MD;  Location: [facility];  Service: Plastics;  Laterality: Left;  LEFT SMALL FINGER EXPLORATION; REPAIR DIGITAL NERVE       Family History     Family History   Problem Relation Age of Onset   . Breast cancer Neg Hx    . Myocardial Infarction Mother    . Diverticulitis Sister    . Diabetes Maternal Grandmother    . Brain cancer Maternal Grandfather        Social History     History     Social History   . Marital Status: Single     Spouse Name: N/A     Number of Children: N/A   . Years of Education: N/A     Occupational History   . Not on file.     Social History Main Topics   . Smoking status: Current Every Day Smoker -- 0.50 packs/day     Types: Cigarettes   . Smokeless tobacco: Not on file   . Alcohol Use: No   . Drug Use: No   . Sexual Activity: Not on file     Other Topics Concern   . Not on file     Social History Narrative        Allergies     Allergies   Allergen Reactions   . Gadolinium    . Iodine    . Penicillins         Medications     No current facility-administered medications for this encounter.  Current outpatient prescriptions:aspirin 81 MG tablet, Take 81 mg by mouth daily.  , Disp: , Rfl: ;  diphenhydrAMINE (BENADRYL) 25 MG tablet, Take 1 tablet (25 mg total) by mouth every 6 (six) hours as needed for Itching., Disp: 30 tablet, Rfl: 0;  fish oil-omega-3 fatty acids 1000 MG capsule, Take 1 g by mouth daily.  , Disp: , Rfl:   ondansetron (ZOFRAN ODT) 4 MG disintegrating tablet, Dissolve 1 tablet (4 mg total) by mouth every 6 (six) hours as needed for Nausea., Disp: 16 tablet, Rfl: 0;  oxyCODONE-acetaminophen (PERCOCET) 5-325 MG per tablet, Take 1-2 tablets by mouth every 4-6 hours as needed for pain;  Do not drive or operate machinery while taking this medicine, Disp: 20 tablet, Rfl: 0  pregabalin (LYRICA) 25 MG capsule, Start with 1 capsule (25mg ) twice a day for 5 days, then 2 capsules (50mg ) twice a day for 5 days, then 3 capsules (75mg ) twice a day., Disp: 180 capsule, Rfl: 3;  Raloxifene HCl (EVISTA PO), Take 120 mg by mouth daily.  , Disp: , Rfl:     Review of Systems     Pertinent Positives and Negatives noted in the HPI.  All Other Systems Reviewed and Negative: Yes    Physical Exam   BP 136/86   Pulse 83   Temp(Src) 96.8 F (36 C)   Resp 16   Ht 1.549 m   Wt 44.453 kg   BMI 18.53 kg/m2     SpO2 98%     Constitutional: Vital signs reviewed.Well appearing. Easily Conversant  Head: Normocephalic, atraumatic  Eyes: No conjunctival injection. No discharge.  ENT: Mucous membranes moist. OPC  Neck: Normal range of motion. Trachea midline.  Lymph: no adenopathy  Respiratory/Chest:  Clear to auscultation. No respiratory distress  Cardiovascular: Regular rate and rhythm.  No murmur. Peripheral pulses intact  Abdomen: Soft,non tender. No guarding. No masses or hepatosplenomegaly  UpperExtremity:No edema or cyanosis  LowerExtremity:No edema or cyanosis  Neurological: No focal motor deficits by observation. Speech  normal. Memory normal.  Skin: Warm and dry. No rash.  Psychiatric: Normal affect and normal concentration for age      Diagnostic Study Results     Labs -     Results    ** No results found for the last 24 hours. **          Radiologic Studies -   Radiology Results (24 Hour)    ** No results found for the last 24 hours. **      .    Clinical Course in the Emergency Department     Stable and nad    Odd , anxious affect    Medical Decision Making   I am the first provider for this patient.  I reviewed the vital signs, nursing notes, past medical history, past surgical history, family history and social history.  I have reviewed the patient's previous charts.  Vital Signs - BP 136/86   Pulse 83   Temp(Src) 96.8 F (36 C)   Resp 16   Ht 1.549 m   Wt 44.453 kg   BMI 18.53 kg/m2     SpO2 98%      Ddx: allergi reaction, contact derm, deg bugs, cellulitis, spider bite, med reaction   Plan: clean all sheets, take antihistamine as rx and proph abx for cellulitis and outpt fu derm and pmd  ____________________________________________________________________      Gaynelle Arabian, NP  09/18/13 1309

## 2013-10-01 ENCOUNTER — Ambulatory Visit (INDEPENDENT_AMBULATORY_CARE_PROVIDER_SITE_OTHER): Payer: Medicare Other | Admitting: Vascular Neurology

## 2013-10-01 ENCOUNTER — Encounter (INDEPENDENT_AMBULATORY_CARE_PROVIDER_SITE_OTHER): Payer: Self-pay | Admitting: Vascular Neurology

## 2013-10-01 VITALS — BP 102/63 | HR 66

## 2013-10-01 DIAGNOSIS — G5603 Carpal tunnel syndrome, bilateral upper limbs: Secondary | ICD-10-CM

## 2013-10-01 DIAGNOSIS — R519 Headache, unspecified: Secondary | ICD-10-CM

## 2013-10-01 DIAGNOSIS — M5412 Radiculopathy, cervical region: Secondary | ICD-10-CM

## 2013-10-01 DIAGNOSIS — G5600 Carpal tunnel syndrome, unspecified upper limb: Secondary | ICD-10-CM

## 2013-10-01 DIAGNOSIS — R51 Headache: Secondary | ICD-10-CM

## 2013-10-01 DIAGNOSIS — M542 Cervicalgia: Secondary | ICD-10-CM

## 2013-10-01 MED ORDER — PREGABALIN 50 MG PO CAPS
50.0000 mg | ORAL_CAPSULE | Freq: Three times a day (TID) | ORAL | Status: DC
Start: 2013-10-01 — End: 2014-01-16

## 2013-10-01 NOTE — Progress Notes (Signed)
Subjective:      Patient ID: Ann Patterson is a 63 y.o. female.    HPI    The patient says that Lyrica is working out better for her at 50 mg t.i.d.   She says her pain is "doing great" with Lyrica.  She says prior symptoms of  dyspnea are also better with the change in Lyrica dose.  She has a bit of  muscle soreness and stiffness at times, but her neck pain seems to be well  controlled on her current regimen.  In the past, physical therapy has been  very helpful with her neck pain, although her insurance apparently does not  cover this.  She has not been able to see pain management.      Review of Systems  Constitutional: Negative for fever.   Respiratory: Negative for shortness of breath.    Cardiovascular: Negative for chest pain.   Gastrointestinal: Negative for abdominal pain.   Neurological: Negative for dizziness, weakness, numbness and headaches.   Psychiatric/Behavioral: Negative for sleep disturbance.       Objective:   Neurologic Exam  Mental Status: The patient was awake, alert, appeared oriented and was appropriate. Speech was fluent and the patient followed commands well. Attention, concentration, and memory appeared intact. Fund of knowledge appeared appropriate for education level.   Cranial Nerves: Pupils were equally round and reactive to light bilaterally. Extraocular movements were intact without nystagmus. Hearing was intact to conversational speech. Face was symmetric. Tongue appeared midline.   Motor: Good/full strength throughout without clear focality or weakness. Bulk appeared normal for body habitus. No obvious tremor noted.   Sensation: light touch was grossly intact.   Coordination: Finger to nose testing was intact without dysmetria.   Gait: Normal and steady.      Assessment:     1. Neck pain    2. Cervical radiculopathy    3. Headache    4. Carpal tunnel syndrome on both sides          Plan:     1.  Continue Lyrica 50 mg t.i.d.  2.  Physical therapy if able to.    3.  The patient is  not interested in pain management at this point.  4.  Follow up with me in several months or contact the office sooner if  needed.    The patient will return for follow-up as outlined above. If there are any problems with medications (if prescribed) or questions about any available test results, or if there are any new symptoms or changes in current symptoms, the patient is instructed to call the office.     Aniel Hubble B. Stacy Gardner, MD  Board Certified, Neurology  Board Certified, Clinical Neurophysiology  Board Certified, Electrodiagnostic Medicine  Board Certified, Vascular Neurology

## 2013-10-01 NOTE — Progress Notes (Signed)
Have you sought care outside of [facility] since we last saw you?   No

## 2013-11-06 ENCOUNTER — Ambulatory Visit (INDEPENDENT_AMBULATORY_CARE_PROVIDER_SITE_OTHER): Payer: Medicare Other | Admitting: Vascular Neurology

## 2013-11-08 ENCOUNTER — Ambulatory Visit (INDEPENDENT_AMBULATORY_CARE_PROVIDER_SITE_OTHER): Payer: Medicare Other | Admitting: Vascular Neurology

## 2013-11-11 ENCOUNTER — Encounter (INDEPENDENT_AMBULATORY_CARE_PROVIDER_SITE_OTHER): Payer: Self-pay | Admitting: Vascular Neurology

## 2013-11-11 NOTE — Progress Notes (Signed)
The patient's case was discussed with her attorney, Blythe Stanford, phone number  is [phone].  We discussed review of my prior notes, Dr. Ammie Dalton  original note, and reviewed her MRI of the cervical spine from earlier this  year, which was reported to be stable.  Mr. Jake Seats said te patient also had  dental and visual symptoms which were treated, although she apparently also  has other eye issues such as cataracts.     Mr. Jake Seats will let me know if additional information is required.

## 2013-11-14 ENCOUNTER — Other Ambulatory Visit (INDEPENDENT_AMBULATORY_CARE_PROVIDER_SITE_OTHER): Payer: Self-pay

## 2013-11-14 DIAGNOSIS — M542 Cervicalgia: Secondary | ICD-10-CM

## 2013-11-14 MED ORDER — RALOXIFENE HCL 60 MG PO TABS
60.0000 mg | ORAL_TABLET | Freq: Every day | ORAL | Status: DC
Start: 2013-11-14 — End: 2014-10-10

## 2014-01-16 ENCOUNTER — Ambulatory Visit (INDEPENDENT_AMBULATORY_CARE_PROVIDER_SITE_OTHER): Payer: Medicare Other | Admitting: Vascular Neurology

## 2014-01-16 ENCOUNTER — Encounter (INDEPENDENT_AMBULATORY_CARE_PROVIDER_SITE_OTHER): Payer: Self-pay | Admitting: Vascular Neurology

## 2014-01-16 ENCOUNTER — Other Ambulatory Visit (INDEPENDENT_AMBULATORY_CARE_PROVIDER_SITE_OTHER): Payer: Self-pay

## 2014-01-16 VITALS — BP 96/60 | HR 73 | Ht 61.0 in | Wt 88.0 lb

## 2014-01-16 DIAGNOSIS — M5412 Radiculopathy, cervical region: Secondary | ICD-10-CM

## 2014-01-16 DIAGNOSIS — G5600 Carpal tunnel syndrome, unspecified upper limb: Secondary | ICD-10-CM

## 2014-01-16 DIAGNOSIS — M542 Cervicalgia: Secondary | ICD-10-CM

## 2014-01-16 DIAGNOSIS — G5603 Carpal tunnel syndrome, bilateral upper limbs: Secondary | ICD-10-CM

## 2014-01-16 DIAGNOSIS — R51 Headache: Secondary | ICD-10-CM

## 2014-01-16 MED ORDER — PREGABALIN 75 MG PO CAPS
75.0000 mg | ORAL_CAPSULE | Freq: Three times a day (TID) | ORAL | Status: DC
Start: 2014-01-16 — End: 2014-02-27

## 2014-01-16 NOTE — Progress Notes (Signed)
Subjective:      Patient ID: Ann Patterson is a 63 y.o. female.    HPI  The patient says Lyrica is working much better for her pain in the gabapentin.  She is on 50 mg 3 times a day.  She has occasional blurriness, usually if she moves her head or has a twinge of neck pain.  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  She says she will have occasional paresthesias in her hands with numbness, usually intermittent, but she believes comes from her neck.  There are no other new neurologic complaints.      Review of Systems  Constitutional: Negative for fever.   Respiratory: Negative for shortness of breath.    Cardiovascular: Negative for chest pain.   Gastrointestinal: Negative for abdominal pain.   Neurological: Negative for dizziness, weakness, numbness.   Psychiatric/Behavioral: Negative for sleep disturbance.       Objective:   Neurologic Exam  Mental Status: The patient was awake, alert, appeared oriented and was appropriate. Speech was fluent and the patient followed commands well. Attention, concentration, and memory appeared intact. Fund of knowledge appeared appropriate for education level.   Cranial Nerves: Pupils were equally round and reactive to light bilaterally. Extraocular movements were intact without nystagmus. Hearing was intact to conversational speech. Face was symmetric. Tongue appeared midline.   Motor: Good/full strength throughout without clear focality or weakness. Bulk appeared normal for body habitus. No obvious tremor noted.   Sensation: light touch was grossly intact.   Coordination: Finger to nose testing was intact without dysmetria.   Gait: Normal and steady.  DTRs 2+ UEs, + tinels bilateral wrists.      Assessment:     1. Neck pain    2. Cervical radiculopathy    3. Headache    4. Carpal tunnel syndrome on both sides          Plan:     1.  Increase Lyrica to 75 mg 3 times a day.  2.  May continue with other when necessary medications for  pain.  3.  I suggested she consider using wrist splints on a more regular basis.  4.  Follow-up with me in a few months or contact the office sooner if needed.    The patient will return for follow-up as outlined above. If there are any problems with medications (if prescribed) or questions about any available test results, or if there are any new symptoms or changes in current symptoms, the patient is instructed to call the office.     Yasseen Salls B. Ann Gardner, MD  Board Certified, Neurology  Board Certified, Clinical Neurophysiology  Board Certified, Electrodiagnostic Medicine  Board Certified, Vascular Neurology

## 2014-02-27 ENCOUNTER — Other Ambulatory Visit (INDEPENDENT_AMBULATORY_CARE_PROVIDER_SITE_OTHER): Payer: Self-pay

## 2014-02-27 DIAGNOSIS — M5412 Radiculopathy, cervical region: Secondary | ICD-10-CM

## 2014-02-27 DIAGNOSIS — M542 Cervicalgia: Secondary | ICD-10-CM

## 2014-02-27 MED ORDER — PREGABALIN 75 MG PO CAPS
75.0000 mg | ORAL_CAPSULE | Freq: Three times a day (TID) | ORAL | Status: DC
Start: 2014-02-27 — End: 2014-04-03

## 2014-03-27 ENCOUNTER — Ambulatory Visit (INDEPENDENT_AMBULATORY_CARE_PROVIDER_SITE_OTHER): Payer: Medicare Other | Admitting: Vascular Neurology

## 2014-04-03 ENCOUNTER — Ambulatory Visit (INDEPENDENT_AMBULATORY_CARE_PROVIDER_SITE_OTHER): Payer: Medicare Other | Admitting: Vascular Neurology

## 2014-04-03 ENCOUNTER — Encounter (INDEPENDENT_AMBULATORY_CARE_PROVIDER_SITE_OTHER): Payer: Self-pay | Admitting: Vascular Neurology

## 2014-04-03 VITALS — BP 114/71 | HR 69 | Wt 90.0 lb

## 2014-04-03 DIAGNOSIS — M542 Cervicalgia: Secondary | ICD-10-CM

## 2014-04-03 DIAGNOSIS — G5601 Carpal tunnel syndrome, right upper limb: Secondary | ICD-10-CM

## 2014-04-03 DIAGNOSIS — M5412 Radiculopathy, cervical region: Secondary | ICD-10-CM

## 2014-04-03 DIAGNOSIS — G5602 Carpal tunnel syndrome, left upper limb: Secondary | ICD-10-CM

## 2014-04-03 DIAGNOSIS — G5603 Carpal tunnel syndrome, bilateral upper limbs: Secondary | ICD-10-CM

## 2014-04-03 MED ORDER — PREGABALIN 100 MG PO CAPS
100.0000 mg | ORAL_CAPSULE | Freq: Three times a day (TID) | ORAL | Status: DC
Start: 2014-04-03 — End: 2014-07-03

## 2014-04-03 NOTE — Progress Notes (Signed)
Subjective:      Patient ID: Ann Patterson is a 63 y.o. female.    HPI  The patient says Lyrica is helping quite a bit with her neck pain, but she does note more pain if she misses a dose.  Because of her previous prescription, she tried taking Lyrica 100 mg in the morning, 50 mg midday, and another 100 mg at night.  She notes that the higher dose has helped more with her pain.  She is able to hold her arms up and hold them up for longer now.  She feels that the pain in her neck and swelling has improved.  She has had some pain in the right hand and wrist, but in general has not needed to use a wrist splint and her carpal tunnel symptoms feel better.  She denies any side effects from the Lyrica.    Review of Systems  Constitutional: Negative for fever.   Respiratory: Negative for shortness of breath.    Cardiovascular: Negative for chest pain.   Gastrointestinal: Negative for abdominal pain.   Neurological: Negative for dizziness, weakness, numbness and headaches.   Psychiatric/Behavioral: Negative for sleep disturbance.       Objective:   Neurologic Exam  Mental Status: The patient was awake, alert, appeared oriented and was appropriate. Speech was fluent and the patient followed commands well. Attention, concentration, and memory appeared intact. Fund of knowledge appeared appropriate for education level.   Cranial Nerves: Pupils were equally round and reactive to light bilaterally. Extraocular movements were intact without nystagmus. Hearing was intact to conversational speech. Face was symmetric. Tongue appeared midline.   Motor: Good/full strength throughout without clear focality or weakness. Bulk appeared normal for body habitus. No obvious tremor noted.   Sensation: light touch was grossly intact.   Coordination: Finger to nose testing was intact without dysmetria.   Gait: Normal and steady.      Assessment:     1. Neck pain    2. Cervical radiculopathy    3. Carpal tunnel syndrome on both sides           Plan:     1.  We will increase Lyrica dose to 100 mg 3 times a day.  2.  Physical therapy when able to, if needed.  3.  Follow-up with me in 3 months or contact the office sooner if needed.    The patient will return for follow-up as outlined above. If there are any problems with medications (if prescribed) or questions about any available test results, or if there are any new symptoms or changes in current symptoms, the patient is instructed to call the office.     Dravon Nott B. Stacy Gardner, MD  Board Certified, Neurology  Board Certified, Clinical Neurophysiology  Board Certified, Electrodiagnostic Medicine  Board Certified, Vascular Neurology

## 2014-04-14 ENCOUNTER — Encounter (INDEPENDENT_AMBULATORY_CARE_PROVIDER_SITE_OTHER): Payer: Self-pay

## 2014-04-14 ENCOUNTER — Other Ambulatory Visit (INDEPENDENT_AMBULATORY_CARE_PROVIDER_SITE_OTHER): Payer: Self-pay

## 2014-04-14 NOTE — Progress Notes (Signed)
Robaxin 500 mg tabs-take one tablet by mouth TID prn muscle spasms. #90-no refills called to [facility] on file. Pt notified to check with pharmacy to see if RX is ready for pickup.

## 2014-04-14 NOTE — Progress Notes (Signed)
Pt called stating severe muscle spasms started again over the weekend. Orders per Dr Stacy Gardner for Robaxin called in to pharmacy on file.  Appointment made for patient to see Dr Stacy Gardner on Thursday, 04-17-14 at 1315-[place] office.

## 2014-04-15 ENCOUNTER — Encounter (INDEPENDENT_AMBULATORY_CARE_PROVIDER_SITE_OTHER): Payer: Self-pay

## 2014-04-15 NOTE — Progress Notes (Signed)
Pt called, sounded tearful. States lawyer called and said that she had to go [place] for PT and couldn't use [facility]. States muscles are relaxing a little bit better on the right side but left side is still "bunched up."  Encouraged to take a warm shower and a robaxin and we will see her in the office tomorrow. Pt agrees.

## 2014-04-16 ENCOUNTER — Encounter (INDEPENDENT_AMBULATORY_CARE_PROVIDER_SITE_OTHER): Payer: Self-pay

## 2014-04-16 ENCOUNTER — Telehealth (INDEPENDENT_AMBULATORY_CARE_PROVIDER_SITE_OTHER): Payer: Self-pay

## 2014-04-16 ENCOUNTER — Other Ambulatory Visit (INDEPENDENT_AMBULATORY_CARE_PROVIDER_SITE_OTHER): Payer: Self-pay

## 2014-04-16 DIAGNOSIS — M62838 Other muscle spasm: Secondary | ICD-10-CM

## 2014-04-16 NOTE — Progress Notes (Signed)
Pt called stating that she has been taking 3-Robaxin 500mg  tabs in a single dose. Pt instructed to take only one tablet 3 times daily. States that taking 3 at one time has helped a bit, but she's still "contracted up." Pt has follow up with Dr Stacy Gardner tomorrow and was instructed to take only one tab per dose. Verbalized understanding.

## 2014-04-17 ENCOUNTER — Ambulatory Visit (INDEPENDENT_AMBULATORY_CARE_PROVIDER_SITE_OTHER): Payer: Medicare Other | Admitting: Vascular Neurology

## 2014-04-17 ENCOUNTER — Encounter (INDEPENDENT_AMBULATORY_CARE_PROVIDER_SITE_OTHER): Payer: Self-pay | Admitting: Vascular Neurology

## 2014-04-17 VITALS — BP 125/78 | HR 89 | Resp 20 | Ht 61.0 in | Wt 89.6 lb

## 2014-04-17 DIAGNOSIS — M5412 Radiculopathy, cervical region: Secondary | ICD-10-CM

## 2014-04-17 DIAGNOSIS — G5601 Carpal tunnel syndrome, right upper limb: Secondary | ICD-10-CM

## 2014-04-17 DIAGNOSIS — G5602 Carpal tunnel syndrome, left upper limb: Secondary | ICD-10-CM

## 2014-04-17 DIAGNOSIS — G5603 Carpal tunnel syndrome, bilateral upper limbs: Secondary | ICD-10-CM

## 2014-04-17 DIAGNOSIS — M542 Cervicalgia: Secondary | ICD-10-CM

## 2014-04-17 MED ORDER — CYCLOBENZAPRINE HCL 5 MG PO TABS
5.0000 mg | ORAL_TABLET | Freq: Three times a day (TID) | ORAL | Status: DC | PRN
Start: 2014-04-17 — End: 2014-10-10

## 2014-04-17 MED ORDER — PREGABALIN 25 MG PO CAPS
25.0000 mg | ORAL_CAPSULE | Freq: Three times a day (TID) | ORAL | Status: DC
Start: 2014-04-17 — End: 2014-07-03

## 2014-04-17 NOTE — Progress Notes (Signed)
Subjective:      Patient ID: Ann Patterson is a 63 y.o. female.    HPI  The patient had been feeling better in general, and reported good energy, but recently she started feeling more neck pain and muscle spasms over the bilateral upper cervical regions, which would extend into her head.  If she pressed on the areas of discomfort, the pain would go away.  She noted reduced range of motion in her head.  Today, she feels somewhat better.  She has been using Robaxin up to 1500 mg twice a day, which seemed to help, but she says it is too expensive for her.  She also continues on Lyrica 100 mg 3 times a day, which has helped.  She has not yet done any physical therapy, and is waiting to start this, but apparently it has been approved.  According to her attorney.  Her hands are stable, although she does drop things at times.      Review of Systems  Constitutional: Negative for fever.   Respiratory: Negative for shortness of breath.    Cardiovascular: Negative for chest pain.   Gastrointestinal: Negative for abdominal pain.   Neurological: Negative for dizziness, weakness, numbness.   Psychiatric/Behavioral: Negative for sleep disturbance.     Objective:   Neurologic Exam  Mental Status: The patient was awake, alert, appeared oriented and was appropriate. Speech was fluent and the patient followed commands well. Attention, concentration, and memory appeared intact. Fund of knowledge appeared appropriate for education level.   Cranial Nerves: Pupils were equally round and reactive to light bilaterally. Extraocular movements were intact without nystagmus. Hearing was intact to conversational speech. Face was symmetric. Tongue appeared midline.   Motor: Good/full strength throughout without clear focality or weakness. Bulk appeared normal for body habitus. No obvious tremor noted.   Sensation: light touch was grossly intact.   Coordination: Finger to nose testing was intact without dysmetria.   Gait: Normal and  steady.    Assessment:     1. Neck pain    2. Cervicalgia    3. Cervical radiculopathy    4. Carpal tunnel syndrome on both sides          Plan:     1.  We will use Flexeril 5-10 mg 3 times a day when necessary in place of the Robaxin.  2.  Increase Lyrica to 125 mg 3 times a day.  3.  Proceed with physical therapy when able to.  4.  Follow-up with me in the next few months or contact the office sooner if needed.    The patient will return for follow-up as outlined above. If there are any problems with medications (if prescribed) or questions about any available test results, or if there are any new symptoms or changes in current symptoms, the patient is instructed to call the office.     Kathleen Likins B. Stacy Gardner, MD  Board Certified, Neurology  Board Certified, Clinical Neurophysiology  Board Certified, Electrodiagnostic Medicine  Board Certified, Vascular Neurology

## 2014-07-03 ENCOUNTER — Ambulatory Visit (INDEPENDENT_AMBULATORY_CARE_PROVIDER_SITE_OTHER): Payer: Medicare Other | Admitting: Vascular Neurology

## 2014-07-03 VITALS — BP 94/60 | HR 72 | Ht 61.0 in | Wt 94.0 lb

## 2014-07-03 DIAGNOSIS — G5601 Carpal tunnel syndrome, right upper limb: Secondary | ICD-10-CM

## 2014-07-03 DIAGNOSIS — M5412 Radiculopathy, cervical region: Secondary | ICD-10-CM

## 2014-07-03 DIAGNOSIS — G5602 Carpal tunnel syndrome, left upper limb: Secondary | ICD-10-CM

## 2014-07-03 DIAGNOSIS — G5603 Carpal tunnel syndrome, bilateral upper limbs: Secondary | ICD-10-CM

## 2014-07-03 DIAGNOSIS — R202 Paresthesia of skin: Secondary | ICD-10-CM

## 2014-07-03 DIAGNOSIS — M542 Cervicalgia: Secondary | ICD-10-CM

## 2014-07-03 MED ORDER — PREGABALIN 100 MG PO CAPS
100.0000 mg | ORAL_CAPSULE | Freq: Three times a day (TID) | ORAL | Status: DC
Start: 2014-07-03 — End: 2014-10-10

## 2014-07-03 MED ORDER — PREGABALIN 25 MG PO CAPS
50.0000 mg | ORAL_CAPSULE | Freq: Three times a day (TID) | ORAL | Status: DC
Start: 2014-07-03 — End: 2014-10-10

## 2014-07-03 NOTE — Progress Notes (Signed)
Subjective:      Patient ID: Ann Patterson is a 64 y.o. female.    HPI  The patient says that she recently settled her case.  The patient said that she recently settled her case, and although she was disappointed with the settlement, she continues to have symptoms of neck pain.  She describes tightness in her neck as well as muscle pulling, and numbness and tingling in the arms and fingers at times.  She uses Lyrica 125 mg 3 times a day, which has helped a bit, and Flexeril as needed.        Review of Systems  Constitutional: Negative for fever.   Respiratory: Negative for shortness of breath.    Cardiovascular: Negative for chest pain.   Gastrointestinal: Negative for abdominal pain.   Neurological: Negative for dizziness, weakness and headaches.   Psychiatric/Behavioral: Negative for sleep disturbance.       Objective:   Neurologic Exam  Mental Status: The patient was awake, alert, appeared oriented and was appropriate. Speech was fluent and the patient followed commands well. Attention, concentration, and memory appeared intact. Fund of knowledge appeared appropriate for education level.   Cranial Nerves: Pupils were equally round and reactive to light bilaterally. Extraocular movements were intact without nystagmus. Hearing was intact to conversational speech. Face was symmetric. Tongue appeared midline. ? Slight trapezius asymmetry  Motor: Good/full strength throughout without clear focality or weakness. Bulk appeared normal for body habitus. No obvious tremor noted.   Sensation: light touch was grossly intact.   Coordination: Finger to nose testing was intact without dysmetria.   Gait: Normal and steady.      Assessment:     1. Neck pain    2. Cervical radiculopathy    3. Paresthesia of arm    4. Carpal tunnel syndrome on both sides          Plan:     1.  Increase Lyrica to 150 mg 3 times a day.  2.  Flexeril when necessary.  3.  PT/OT.  4.  We discussed potentially using Botox for her neck pain and  questionable subtle dystonia, but she wished to defer that this time.  5.  Follow-up with me in about 3 months or contact the office sooner if needed.    The patient will return for follow-up as outlined above. If there are any problems with medications (if prescribed) or questions about any available test results, or if there are any new symptoms or changes in current symptoms, the patient is instructed to call the office.     Elion Hocker B. Stacy Gardner, MD  Board Certified, Neurology  Board Certified, Clinical Neurophysiology  Board Certified, Vascular Neurology  Board Certified, Sleep Medicine

## 2014-07-29 ENCOUNTER — Encounter (INDEPENDENT_AMBULATORY_CARE_PROVIDER_SITE_OTHER): Payer: Self-pay | Admitting: Vascular Neurology

## 2014-10-10 ENCOUNTER — Ambulatory Visit (INDEPENDENT_AMBULATORY_CARE_PROVIDER_SITE_OTHER): Payer: Medicare Other | Admitting: Vascular Neurology

## 2014-10-10 ENCOUNTER — Encounter (INDEPENDENT_AMBULATORY_CARE_PROVIDER_SITE_OTHER): Payer: Self-pay | Admitting: Vascular Neurology

## 2014-10-10 ENCOUNTER — Other Ambulatory Visit (INDEPENDENT_AMBULATORY_CARE_PROVIDER_SITE_OTHER): Payer: Self-pay

## 2014-10-10 VITALS — BP 125/75 | HR 76 | Ht 61.0 in | Wt 95.0 lb

## 2014-10-10 DIAGNOSIS — G5601 Carpal tunnel syndrome, right upper limb: Secondary | ICD-10-CM

## 2014-10-10 DIAGNOSIS — M542 Cervicalgia: Secondary | ICD-10-CM

## 2014-10-10 DIAGNOSIS — M5412 Radiculopathy, cervical region: Secondary | ICD-10-CM

## 2014-10-10 DIAGNOSIS — G5602 Carpal tunnel syndrome, left upper limb: Secondary | ICD-10-CM

## 2014-10-10 DIAGNOSIS — G5603 Carpal tunnel syndrome, bilateral upper limbs: Secondary | ICD-10-CM

## 2014-10-10 MED ORDER — RALOXIFENE HCL 60 MG PO TABS
60.0000 mg | ORAL_TABLET | Freq: Every day | ORAL | Status: AC
Start: 2014-10-10 — End: ?

## 2014-10-10 MED ORDER — CYCLOBENZAPRINE HCL 5 MG PO TABS
5.0000 mg | ORAL_TABLET | Freq: Three times a day (TID) | ORAL | Status: DC | PRN
Start: 2014-10-10 — End: 2015-08-18

## 2014-10-10 MED ORDER — PREGABALIN 150 MG PO CAPS
150.0000 mg | ORAL_CAPSULE | Freq: Three times a day (TID) | ORAL | Status: DC
Start: 2014-10-10 — End: 2015-04-03

## 2014-10-10 MED ORDER — PREGABALIN 25 MG PO CAPS
50.0000 mg | ORAL_CAPSULE | Freq: Three times a day (TID) | ORAL | Status: DC
Start: 2014-10-10 — End: 2015-02-28

## 2014-10-10 NOTE — Progress Notes (Signed)
Subjective:      Patient ID: Ann Patterson is a 64 y.o. female.    HPI    The patient says she is doing relatively well except she has occasional muscle spasms in the neck and posterior head.  At times, which occasionally radiates into the right side of her head.  She is on Lyrica, which works well and in general, the symptoms have dissipated.  The symptoms dissipate completely if she massages or puts pressure in that area.  She did not get a chance to set up physical therapy because of other commitments.  Rarely she has pain shooting into the arms, which is not as frequent as it was before.    She uses Flexeril as needed, but has found Lyrica 150 mg 3 times a day very helpful, and says she "just functions better."    Review of Systems  Constitutional: Negative for fever.   Respiratory: Negative for shortness of breath.    Cardiovascular: Negative for chest pain.   Gastrointestinal: Negative for abdominal pain.   Neurological: Negative for dizziness, weakness, numbness and headaches.   Psychiatric/Behavioral: Negative for sleep disturbance.       Objective:   Neurologic Exam    Mental Status: The patient was awake, alert, appeared oriented and was appropriate. Speech was fluent and the patient followed commands well. Attention, concentration, and memory appeared intact. Fund of knowledge appeared appropriate for education level.   Cranial Nerves: Pupils were equally round and reactive to light bilaterally. Extraocular movements were intact without nystagmus. Hearing was intact to conversational speech. Face was symmetric. Tongue appeared midline.   Motor: Good/full strength throughout without clear focality or weakness. Bulk appeared normal for body habitus. No obvious tremor noted.   Sensation: light touch was grossly intact.   Coordination: Finger to nose testing was intact without dysmetria.   Gait: Normal and steady.    Assessment:     1. Neck pain    2. Cervical radiculopathy    3. Carpal tunnel syndrome on  both sides          Plan:     1.  Continue Lyrica 150 mg 3 times a day.  2.  Proceed with physical therapy.  3.  Flexeril when necessary.  4.  Follow-up with me in several months for reassessment or contact the office sooner if needed.    The patient will return for follow-up as outlined above. If there are any problems with medications (if prescribed) or questions about any available test results, or if there are any new symptoms or changes in current symptoms, the patient is instructed to call the office.     Dillon Mcreynolds B. Stacy Gardner, MD  Board Certified, Neurology  Board Certified, Clinical Neurophysiology  Board Certified, Vascular Neurology  Board Certified, Sleep Medicine

## 2014-12-03 ENCOUNTER — Other Ambulatory Visit: Payer: Self-pay | Admitting: Obstetrics & Gynecology

## 2014-12-03 DIAGNOSIS — R928 Other abnormal and inconclusive findings on diagnostic imaging of breast: Secondary | ICD-10-CM

## 2014-12-05 ENCOUNTER — Ambulatory Visit
Admission: RE | Admit: 2014-12-05 | Discharge: 2014-12-05 | Disposition: A | Discharge status: Home / Self Care | Payer: Medicare Other | Source: Ambulatory Visit | Attending: Obstetrics & Gynecology | Admitting: Obstetrics & Gynecology

## 2014-12-05 DIAGNOSIS — R928 Other abnormal and inconclusive findings on diagnostic imaging of breast: Secondary | ICD-10-CM

## 2014-12-16 ENCOUNTER — Encounter (INDEPENDENT_AMBULATORY_CARE_PROVIDER_SITE_OTHER): Payer: Self-pay

## 2015-02-11 ENCOUNTER — Encounter (INDEPENDENT_AMBULATORY_CARE_PROVIDER_SITE_OTHER): Payer: Self-pay

## 2015-02-27 ENCOUNTER — Ambulatory Visit: Payer: Medicare Other | Attending: Plastic Surgery

## 2015-02-27 DIAGNOSIS — Z01818 Encounter for other preprocedural examination: Secondary | ICD-10-CM | POA: Insufficient documentation

## 2015-02-27 DIAGNOSIS — Z01812 Encounter for preprocedural laboratory examination: Secondary | ICD-10-CM | POA: Insufficient documentation

## 2015-02-27 LAB — PT AND APTT
PT INR: 1 (ref 0.9–1.1)
PT: 13.3 s (ref 12.6–15.0)
PTT: 30 s (ref 23–37)

## 2015-02-27 LAB — CBC
Hematocrit: 39.5 % (ref 37.0–47.0)
Hgb: 13.1 g/dL (ref 12.0–16.0)
MCH: 30.4 pg (ref 28.0–32.0)
MCHC: 33.2 g/dL (ref 32.0–36.0)
MCV: 91.6 fL (ref 80.0–100.0)
MPV: 10.5 fL (ref 9.4–12.3)
Nucleated RBC: 0 /100 WBC (ref 0–1)
Platelets: 209 10*3/uL (ref 140–400)
RBC: 4.31 10*6/uL (ref 4.20–5.40)
RDW: 18 % — ABNORMAL HIGH (ref 12–15)
WBC: 8.27 10*3/uL (ref 3.50–10.80)

## 2015-02-27 LAB — BASIC METABOLIC PANEL
BUN: 6 mg/dL — ABNORMAL LOW (ref 7.0–19.0)
CO2: 28 mEq/L (ref 21–30)
Calcium: 9.1 mg/dL (ref 8.5–10.5)
Chloride: 106 mEq/L (ref 100–111)
Creatinine: 0.8 mg/dL (ref 0.4–1.5)
Glucose: 79 mg/dL (ref 70–100)
Potassium: 4 mEq/L (ref 3.5–5.3)
Sodium: 140 mEq/L (ref 135–146)

## 2015-02-27 LAB — ECG 12-LEAD
Atrial Rate: 66 {beats}/min
P Axis: 61 degrees
P-R Interval: 142 ms
Q-T Interval: 426 ms
QRS Duration: 74 ms
QTC Calculation (Bezet): 446 ms
R Axis: 21 degrees
T Axis: 47 degrees
Ventricular Rate: 66 {beats}/min

## 2015-02-27 LAB — HEMOLYSIS INDEX: Hemolysis Index: 3 (ref 0–18)

## 2015-02-27 LAB — GFR: EGFR: >60

## 2015-02-28 ENCOUNTER — Other Ambulatory Visit: Payer: Self-pay | Admitting: Plastic Surgery

## 2015-02-28 NOTE — Pre-Procedure Instructions (Addendum)
   Patient came to [facility] for testing  Attempted to do interview 02/27/15 after testing but the patient was too tired and wanted to have interview 02/28/15 after 11 am     I attempted to call the patient for her interview 02/28/15 at 12 noon   She was unable to do the interview because she was taking a shower and then going to have her CXR done  [facility] said she wiould call me back when the cxray was completed    Completed face to face interview with patient   Labs and ekg wnl   Pt had Cxray at [facility] today  Pt read back time of arrival 0600   NPO after MN  Instructions   DI[facility] to [facility] given  Pt has neck pain issues and cannot tilt her head backwards well

## 2015-02-28 NOTE — Pre-Procedure Instructions (Signed)
   Reviewed labs and ekg   Cxr pending

## 2015-03-02 ENCOUNTER — Other Ambulatory Visit: Payer: Self-pay

## 2015-03-02 ENCOUNTER — Ambulatory Visit: Payer: Medicare Other | Admitting: Anesthesiology

## 2015-03-02 ENCOUNTER — Ambulatory Visit
Admission: RE | Admit: 2015-03-02 | Discharge: 2015-03-02 | Disposition: A | Discharge status: Home / Self Care | Payer: Medicare Other | Source: Ambulatory Visit | Attending: Plastic Surgery | Admitting: Plastic Surgery

## 2015-03-02 ENCOUNTER — Ambulatory Visit: Payer: Medicare Other | Admitting: Plastic Surgery

## 2015-03-02 ENCOUNTER — Encounter
Admission: RE | Disposition: A | Discharge status: Home / Self Care | Payer: Self-pay | Source: Ambulatory Visit | Attending: Plastic Surgery

## 2015-03-02 DIAGNOSIS — M542 Cervicalgia: Secondary | ICD-10-CM

## 2015-03-02 DIAGNOSIS — Z01810 Encounter for preprocedural cardiovascular examination: Secondary | ICD-10-CM

## 2015-03-02 DIAGNOSIS — F17210 Nicotine dependence, cigarettes, uncomplicated: Secondary | ICD-10-CM | POA: Insufficient documentation

## 2015-03-02 DIAGNOSIS — M79604 Pain in right leg: Secondary | ICD-10-CM

## 2015-03-02 DIAGNOSIS — Z880 Allergy status to penicillin: Secondary | ICD-10-CM | POA: Insufficient documentation

## 2015-03-02 DIAGNOSIS — G5603 Carpal tunnel syndrome, bilateral upper limbs: Secondary | ICD-10-CM

## 2015-03-02 DIAGNOSIS — Z8669 Personal history of other diseases of the nervous system and sense organs: Secondary | ICD-10-CM

## 2015-03-02 DIAGNOSIS — R413 Other amnesia: Secondary | ICD-10-CM

## 2015-03-02 DIAGNOSIS — W19XXXA Unspecified fall, initial encounter: Secondary | ICD-10-CM | POA: Insufficient documentation

## 2015-03-02 DIAGNOSIS — S62327A Displaced fracture of shaft of fifth metacarpal bone, left hand, initial encounter for closed fracture: Secondary | ICD-10-CM | POA: Insufficient documentation

## 2015-03-02 DIAGNOSIS — M5412 Radiculopathy, cervical region: Secondary | ICD-10-CM

## 2015-03-02 DIAGNOSIS — Z7982 Long term (current) use of aspirin: Secondary | ICD-10-CM | POA: Insufficient documentation

## 2015-03-02 DIAGNOSIS — S63413A Traumatic rupture of collateral ligament of left middle finger at metacarpophalangeal and interphalangeal joint, initial encounter: Secondary | ICD-10-CM | POA: Insufficient documentation

## 2015-03-02 DIAGNOSIS — M25551 Pain in right hip: Secondary | ICD-10-CM

## 2015-03-02 HISTORY — PX: REPAIR, LIGAMENT: SHX5265

## 2015-03-02 HISTORY — DX: Polyneuropathy, unspecified: G62.9

## 2015-03-02 HISTORY — DX: Headache, unspecified: R51.9

## 2015-03-02 HISTORY — PX: ORIF, FINGER: SHX4887

## 2015-03-02 HISTORY — DX: Low back pain, unspecified: M54.50

## 2015-03-02 SURGERY — REPAIR, LIGAMENT
Anesthesia: Anesthesia General | Site: Arm Lower | Laterality: Left | Wound class: Clean

## 2015-03-02 MED ORDER — MIDAZOLAM HCL 2 MG/2ML IJ SOLN
INTRAMUSCULAR | Status: AC
Start: 2015-03-02 — End: ?
  Filled 2015-03-02: qty 2

## 2015-03-02 MED ORDER — PROPOFOL 10 MG/ML IV EMUL (WRAP)
INTRAVENOUS | Status: DC | PRN
Start: 2015-03-02 — End: 2015-03-02
  Administered 2015-03-02: 150 mg via INTRAVENOUS

## 2015-03-02 MED ORDER — HYDROMORPHONE HCL 2 MG PO TABS
2.0000 mg | ORAL_TABLET | Freq: Once | ORAL | Status: AC | PRN
Start: 2015-03-02 — End: 2015-03-02

## 2015-03-02 MED ORDER — ONDANSETRON HCL 4 MG/2ML IJ SOLN
INTRAMUSCULAR | Status: DC | PRN
Start: 2015-03-02 — End: 2015-03-02
  Administered 2015-03-02: 4 mg via INTRAVENOUS

## 2015-03-02 MED ORDER — ONDANSETRON HCL 4 MG/2ML IJ SOLN
INTRAMUSCULAR | Status: AC
Start: 2015-03-02 — End: ?
  Filled 2015-03-02: qty 2

## 2015-03-02 MED ORDER — GLYCOPYRROLATE 0.2 MG/ML IJ SOLN
INTRAMUSCULAR | Status: DC | PRN
Start: 2015-03-02 — End: 2015-03-02
  Administered 2015-03-02 (×2): 0.1 mg via INTRAVENOUS

## 2015-03-02 MED ORDER — ACETAMINOPHEN 500 MG PO TABS
ORAL_TABLET | ORAL | Status: DC
Start: 2015-03-02 — End: 2015-03-02
  Filled 2015-03-02: qty 2

## 2015-03-02 MED ORDER — OXYCODONE HCL 5 MG PO TABS
5.0000 mg | ORAL_TABLET | ORAL | Status: DC | PRN
Start: 2015-03-02 — End: 2015-03-02

## 2015-03-02 MED ORDER — ACETAMINOPHEN 500 MG PO TABS
1000.0000 mg | ORAL_TABLET | Freq: Once | ORAL | Status: DC
Start: 2015-03-02 — End: 2015-03-02

## 2015-03-02 MED ORDER — SODIUM CHLORIDE 0.9 % IR SOLN
Status: DC | PRN
Start: 2015-03-02 — End: 2015-03-02
  Administered 2015-03-02: 1000 mL

## 2015-03-02 MED ORDER — FAMOTIDINE 20 MG/2ML IV SOLN
INTRAVENOUS | Status: AC
Start: 2015-03-02 — End: 2015-03-02
  Administered 2015-03-02: 20 mg via INTRAVENOUS
  Filled 2015-03-02: qty 2

## 2015-03-02 MED ORDER — GABAPENTIN 100 MG PO CAPS
200.0000 mg | ORAL_CAPSULE | Freq: Once | ORAL | Status: AC
Start: 2015-03-02 — End: 2015-03-02

## 2015-03-02 MED ORDER — CLINDAMYCIN PHOSPHATE IN D5W 600 MG/50ML IV SOLN
600.0000 mg | Freq: Once | INTRAVENOUS | Status: DC
Start: 2015-03-02 — End: 2015-03-02

## 2015-03-02 MED ORDER — GLYCOPYRROLATE 0.2 MG/ML IJ SOLN
INTRAMUSCULAR | Status: AC
Start: 2015-03-02 — End: ?
  Filled 2015-03-02: qty 1

## 2015-03-02 MED ORDER — GABAPENTIN 100 MG PO CAPS
ORAL_CAPSULE | ORAL | Status: AC
Start: 2015-03-02 — End: 2015-03-02
  Administered 2015-03-02: 200 mg via ORAL
  Filled 2015-03-02: qty 2

## 2015-03-02 MED ORDER — ONDANSETRON HCL 4 MG/2ML IJ SOLN
4.0000 mg | Freq: Once | INTRAMUSCULAR | Status: DC | PRN
Start: 2015-03-02 — End: 2015-03-02

## 2015-03-02 MED ORDER — EPHEDRINE SULFATE 50 MG/ML IJ SOLN
INTRAMUSCULAR | Status: DC | PRN
Start: 2015-03-02 — End: 2015-03-02
  Administered 2015-03-02 (×2): 5 mg via INTRAVENOUS

## 2015-03-02 MED ORDER — PROMETHAZINE HCL 25 MG/ML IJ SOLN
6.2500 mg | Freq: Once | INTRAMUSCULAR | Status: DC | PRN
Start: 2015-03-02 — End: 2015-03-02

## 2015-03-02 MED ORDER — FAMOTIDINE 10 MG/ML IV SOLN (WRAP)
20.0000 mg | Freq: Once | INTRAVENOUS | Status: AC
Start: 2015-03-02 — End: 2015-03-02

## 2015-03-02 MED ORDER — LACTATED RINGERS IV SOLN
100.0000 mL/h | INTRAVENOUS | Status: DC
Start: 2015-03-02 — End: 2015-03-02

## 2015-03-02 MED ORDER — EPHEDRINE SULFATE 50 MG/ML IJ SOLN
INTRAMUSCULAR | Status: AC
Start: 2015-03-02 — End: ?
  Filled 2015-03-02: qty 1

## 2015-03-02 MED ORDER — PHENYLEPHRINE 100 MCG/ML IN NACL 0.9% IV SOSY
PREFILLED_SYRINGE | INTRAVENOUS | Status: DC | PRN
Start: 2015-03-02 — End: 2015-03-02
  Administered 2015-03-02 (×3): 100 ug via INTRAVENOUS

## 2015-03-02 MED ORDER — BACITRACIN ZINC 500 UNIT/GM EX OINT
TOPICAL_OINTMENT | CUTANEOUS | Status: AC
Start: 2015-03-02 — End: ?
  Filled 2015-03-02: qty 56.7

## 2015-03-02 MED ORDER — HYDROMORPHONE HCL 1 MG/ML IJ SOLN
0.5000 mg | INTRAMUSCULAR | Status: DC | PRN
Start: 2015-03-02 — End: 2015-03-02

## 2015-03-02 MED ORDER — FENTANYL CITRATE (PF) 50 MCG/ML IJ SOLN (WRAP)
25.0000 ug | INTRAMUSCULAR | Status: DC | PRN
Start: 2015-03-02 — End: 2015-03-02

## 2015-03-02 MED ORDER — FENTANYL CITRATE (PF) 50 MCG/ML IJ SOLN (WRAP)
INTRAMUSCULAR | Status: AC
Start: 2015-03-02 — End: 2015-03-02
  Administered 2015-03-02: 25 ug via INTRAVENOUS
  Filled 2015-03-02: qty 2

## 2015-03-02 MED ORDER — LIDOCAINE HCL 2 % IJ SOLN
INTRAMUSCULAR | Status: DC | PRN
Start: 2015-03-02 — End: 2015-03-02
  Administered 2015-03-02: 60 mg via INTRAVENOUS

## 2015-03-02 MED ORDER — PHENYLEPHRINE 100 MCG/ML IN NACL 0.9% IV SOSY
PREFILLED_SYRINGE | INTRAVENOUS | Status: AC
Start: 2015-03-02 — End: ?
  Filled 2015-03-02: qty 5

## 2015-03-02 MED ORDER — FENTANYL CITRATE (PF) 50 MCG/ML IJ SOLN (WRAP)
INTRAMUSCULAR | Status: AC
Start: 2015-03-02 — End: ?
  Filled 2015-03-02: qty 2

## 2015-03-02 MED ORDER — CLINDAMYCIN PHOSPHATE IN D5W 600 MG/50ML IV SOLN
INTRAVENOUS | Status: AC
Start: 2015-03-02 — End: 2015-03-02
  Administered 2015-03-02: 600 mg via INTRAVENOUS
  Filled 2015-03-02: qty 50

## 2015-03-02 MED ORDER — LACTATED RINGERS IV SOLN
INTRAVENOUS | Status: DC
Start: 2015-03-02 — End: 2015-03-02
  Administered 2015-03-02: 1000 mL via INTRAVENOUS

## 2015-03-02 MED ORDER — OXYCODONE HCL 5 MG PO TABS
5.0000 mg | ORAL_TABLET | ORAL | 0 refills | Status: DC | PRN
Start: 2015-03-02 — End: 2015-09-01
  Filled 2015-03-02: qty 60, 10d supply, fill #0

## 2015-03-02 MED ORDER — LIDOCAINE HCL (PF) 2 % IJ SOLN
INTRAMUSCULAR | Status: AC
Start: 2015-03-02 — End: ?
  Filled 2015-03-02: qty 5

## 2015-03-02 MED ORDER — PROPOFOL 10 MG/ML IV EMUL (WRAP)
INTRAVENOUS | Status: AC
Start: 2015-03-02 — End: ?
  Filled 2015-03-02: qty 20

## 2015-03-02 MED ORDER — CLINDAMYCIN HCL 300 MG PO CAPS
300.0000 mg | ORAL_CAPSULE | Freq: Three times a day (TID) | ORAL | 0 refills | Status: AC
Start: 2015-03-02 — End: 2015-03-09
  Filled 2015-03-02: qty 21, 7d supply, fill #0

## 2015-03-02 MED ORDER — FENTANYL CITRATE (PF) 50 MCG/ML IJ SOLN (WRAP)
INTRAMUSCULAR | Status: DC | PRN
Start: 2015-03-02 — End: 2015-03-02
  Administered 2015-03-02: 50 ug via INTRAVENOUS
  Administered 2015-03-02 (×4): 25 ug via INTRAVENOUS

## 2015-03-02 MED ORDER — BUPIVACAINE HCL (PF) 0.5 % IJ SOLN
INTRAMUSCULAR | Status: AC
Start: 2015-03-02 — End: ?
  Filled 2015-03-02: qty 20

## 2015-03-02 MED ORDER — HYDROMORPHONE HCL 2 MG PO TABS
ORAL_TABLET | ORAL | Status: AC
Start: 2015-03-02 — End: 2015-03-02
  Administered 2015-03-02: 2 mg via ORAL
  Filled 2015-03-02: qty 1

## 2015-03-02 MED ORDER — OXYCODONE HCL 5 MG PO TABS
ORAL_TABLET | ORAL | Status: AC
Start: 2015-03-02 — End: 2015-03-02
  Administered 2015-03-02: 5 mg via ORAL
  Filled 2015-03-02: qty 1

## 2015-03-02 MED ORDER — BUPIVACAINE HCL 0.5 % IJ SOLN
INTRAMUSCULAR | Status: DC | PRN
Start: 2015-03-02 — End: 2015-03-02
  Administered 2015-03-02: 10 mL

## 2015-03-02 SURGICAL SUPPLY — 112 items
ANCHOR SYNTHES OD1.3 MM TITANIUM 4-0 P-3 (Anchor) IMPLANT
BANDAGE ACE ELASTIC 3IN STRL (Procedure Accessories) IMPLANT
BANDAGE CMPR PLSTR CTTN CRTY 75X2IN LF (Bandage) ×2
BANDAGE CMPR PLSTR CTTN PRCR 5YDX2IN LF (Procedure Accessories)
BANDAGE COMPRESSION L75 IN X W2 IN 1 PLY (Bandage) ×1 IMPLANT
BANDAGE ELASTIC 3X5YD NLTX (Bandage) ×1 IMPLANT
BANDAGE GZE CTTN MED KRLX 3.6YDX6.4IN LF (Dressing) ×1
BANDAGE KERLIX MEDIUM GAUZE L3.6 YD X (Dressing) IMPLANT
BANDAGE PROCARE COMPRESSION L5 YD X W2 (Procedure Accessories) IMPLANT
BANDAGE PROCARE COMPRESSION L5 YD X W2 IN 2 CLIP FASTENER BREATHABLE (Procedure Accessories) IMPLANT
BLADE SHARP 15 DEG (Blade) IMPLANT
BNDG ACE ELASTIC 4IN STRL (Procedure Accessories) ×2 IMPLANT
BNDG KRLX GZE 3.6YDX6.4IN MED CTTN 6 PLY (Dressing) ×1
BUTTON FIXATION L10.9 MM OD2.6 MM SHOULDER PECTORALIS TITANIUM MINI (Suture) ×1 IMPLANT
BUTTON FX TI 2.6MM 10.9MM SHLDR (Suture) ×2 IMPLANT
COVER HNDL LIGHT SINGLES (Procedure Accessories) ×2 IMPLANT
DRAPE EQUIPMENT MINI C ARM 149CMX104IN 110788 (Drape) ×1 IMPLANT
DRAPE MINI C ARM (Drape) ×2 IMPLANT
DRESSING NON-ADHERING 3 X 16 (Dressing) ×2 IMPLANT
DRESSING PETRO 3% BI 3BRM GZE XR 8X1IN (Dressing) ×2
DRESSING PETRO 3% BI 3BRM GZE XR 9X5IN (Dressing) IMPLANT
DRESSING PETRO GZE VSLN 18X3IN LF STRL (Dressing) IMPLANT
DRESSING PETROLATUM L9 IN X W5 IN NONADHESIVE OCCLUSIVE BACTERIOSTATIC (Dressing) IMPLANT
DRESSING PETROLATUM XEROFORM L8 IN X W1 (Dressing) ×1 IMPLANT
DRESSING PETROLATUM XEROFORM L8 IN X W1 IN 3% BISMUTH TRIBROMOPHENATE (Dressing) IMPLANT
ELECTRODE BLADE (Cautery) ×2 IMPLANT
ELECTRODE ELECTROSURGICAL NEEDLE L3 CM (Cautery) ×1 IMPLANT
ELECTRODE ELECTROSURGICAL NEEDLE L3 CM OD3/32 IN COLORADO TUNGSTEN (Cautery) ×1 IMPLANT
GAUZE KERLIX 4.5X4YDS (Dressing) IMPLANT
GLOVE SRG PLISPRN 6.5 BGL PI ULTRATOUCH (Glove) ×2
GLOVE SURG BIOGEL INDIC SZ 6.5 (Glove) ×2 IMPLANT
GLOVE SURG BIOGEL INDIC SZ 7.5 (Glove) ×2 IMPLANT
GLOVE SURG BIOGEL SZ7.5 (Glove) ×4 IMPLANT
GLOVE SURGICAL 6 1/2 BIOGEL PI (Glove) ×1 IMPLANT
GLOVE SURGICAL 6 1/2 BIOGEL PI ULTRATOUCH G POWDER FREE BEAD CUFF (Glove) ×1 IMPLANT
GOWN SRG PRFRM FBRC LG STDLN AERO BLU LF (Gown) ×4
GOWN SURGICAL LARGE STANDARD LENGTH (Gown) ×2 IMPLANT
GOWN SURGICAL LARGE STANDARD LENGTH PERFORMANCE FABRIC AERO BLUE GREEN (Gown) ×2 IMPLANT
HRST DONUT HL-IN-ONE (Positioning Supplies) ×2 IMPLANT
INACTIVE USE LAWSON 112417 (Gown) ×2 IMPLANT
KIT HAND AND UPPER EXTREMITY (Pack) ×2 IMPLANT
NEEDLE 25GA 1 1/2 (Needles) ×2 IMPLANT
NEEDLE COLORADO CURVED 3CM (Needles) ×2 IMPLANT
NEEDLE DSCT TUNG MIC STRG CO 3CM LF HEAT (Cautery) ×2
NEEDLE KEITH LG SZ 1 (Needles) ×2 IMPLANT
NEEDLE REG BEVEL 19GX1.5IN (Needles) ×2 IMPLANT
PAD ARMBOARD 20X8X2IN (Procedure Accessories) ×2 IMPLANT
PAD ELECTROSRG GRND REM W CRD (Procedure Accessories) ×2 IMPLANT
PAD PREP CUFF 24X41IN W 9IN (Prep) ×2 IMPLANT
PADDING CAST L12 FT X W2 IN REGULAR FINISH WEBRIL WEBRIL (Cast) ×2 IMPLANT
PADDING CAST L4 YD X W3 IN UNDERCAST (Cast) ×1 IMPLANT
PADDING CAST L4 YD X W4 IN UNDERCAST (Cast) IMPLANT
PADDING CAST L4 YD X W4 IN UNDERCAST MILD STRETCH COHESIVE REGULAR (Cast) IMPLANT
PADDING CAST L4YD XW3IN UNDERCAST MILD STRETCH CHSV WEBRIL COTTON (Cast) IMPLANT
PADDING CST CTTN WBRL 4YDX3IN LF STRL (Cast) ×2
PADDING CST CTTN WBRL 4YDX4IN LF STRL (Cast)
PADDING CST WBRL 12FTX2IN LF STRL UNDCST (Cast) ×4 IMPLANT
PLATE ES MIDFACE .3MM THK (Plate) IMPLANT
SCREW BONE L3 MM OD1.55 MM ODSEC2.65 MM TITANIUM MIDFACE SELF DRILL (Screw) IMPLANT
SCREW L3 MM OD1.55 MM ODSEC2.65 MM (Screw) ×6 IMPLANT
SCREW L3 MM OD1.85 MM ODSEC2.65 MM (Screw) ×1 IMPLANT
SCREW L3 MM OD1.85 MM ODSEC2.65 MM TITANIUM MIDFACE EMERGENCY LIGHT (Screw) IMPLANT
SLEEVE CMPR MED KN LGTH KDL SCD 21- IN (Sleeve) ×2 IMPLANT
SLEEVE COMPRESSION MEDIUM KNEE LENGTH KENDALL SEQUENTIAL OD21- IN (Sleeve) ×1 IMPLANT
SOL BETADINE SOLUTION 4 OZ (Prep) ×2 IMPLANT
SOLUTION IRR 0.9% NACL 1000ML LF STRL (Irrigation Solutions) ×2
SOLUTION IRRIGATION 0.9% SODIUM CHLORIDE (Irrigation Solutions) ×1 IMPLANT
SOLUTION IRRIGATION 0.9% SODIUM CHLORIDE 1000 ML PLASTIC POUR BOTTLE (Irrigation Solutions) ×1 IMPLANT
SOLUTION SRGSCRB 10% PVP IOD 4OZ PLS BTL (Scrub Supplies) IMPLANT
SOLUTION SURGICAL SCRUB 10% PVP IODINE 4OZ PLASTIC BOTTLE AQUEOUS SKIN (Scrub Supplies) IMPLANT
SPLINT CAST FIBERGLASS 3X12IN (Cast) IMPLANT
SPLINT CONFORMABLE 4X15IN (Cast) ×1 IMPLANT
SPLINT CONFORMABLE 4X30IN (Cast) IMPLANT
SPLINT ORTH FBGLS SAFETYSPLINT 30X5IN LF (Cast)
SPLINT ORTHOPEDIC L30 IN X W5 IN PRECUT (Cast) IMPLANT
SPLINT ORTHOPEDIC L30 IN X W5 IN PRECUT LIGHTWEIGHT WASHABLE (Cast) IMPLANT
SPLNT SAFETYSPLINT ORTH 30X5IN FBGLS (Cast)
SPONGE CHLRPRP TINT 26ML (Applicator) ×2 IMPLANT
SPONGE GAUZE L4 IN X W4 IN 16 PLY (Dressing) ×1 IMPLANT
SPONGE GAUZE L4 IN X W4 IN 16 PLY MAXIMUM ABSORBENT USP TYPE VII (Dressing) IMPLANT
SPONGE GAUZE L4 IN X W4 IN 4 PLY HIGH (Sponge) IMPLANT
SPONGE GAUZE L4 IN X W4 IN 4 PLY NONWOVEN LINT FREE CURITY RAYON (Sponge) IMPLANT
SPONGE GAUZE L6 3/4 IN X W6 IN MEDIUM (Dressing) ×1 IMPLANT
SPONGE GAUZE L6 3/4 IN X W6 IN MEDIUM ABSORBENT FLUFF DRY CRINKLE (Dressing) ×1 IMPLANT
SPONGE GZE CTTN CRTY 4X4IN LF NS 16 PLY (Dressing) ×2
SPONGE GZE CTTN MED KRLX 6.75X6IN LF (Dressing) ×2
SPONGE GZE RYN PLSTR CRTY 4X4IN LF NS 4 (Sponge)
STAPLER SKIN W4.8 MM X H3.4 MM 35 WIDE (Staplers) ×1 IMPLANT
STAPLER SKN SS MF PRM .51MM 4.8X3.4MM LF (Staplers) ×2
STRIP SKIN CLOSURE L4 IN X W1/2 IN (Dressing) ×1 IMPLANT
STRIP SKIN CLOSURE L4 IN X W1/2 IN REINFORCE STERI-STRIP POLYESTER (Dressing) ×1 IMPLANT
STRIP SKNCLS PLSTR STRSTRP 4X.5IN LF (Dressing) ×2
SYRINGE LUER LOCK 10CC (Syringes, Needles) ×2 IMPLANT
TOURNIQUET 18IN STRL (Procedure Accessories) IMPLANT
TOURNIQUET 18X4 RED (Procedure Accessories) IMPLANT
TOURNIQUET 24IN STRL (Procedure Accessories) ×2 IMPLANT
TOURNIQUET CUFF DBL 12IN (Procedure Accessories) IMPLANT
TOWEL STERILE 6-PACK (Procedure Accessories) ×2 IMPLANT
TRAY SKIN DRY SCRUB (Tray) ×2 IMPLANT
WIRE FIXATION OD.035 IN L6 IN KIRSCHNER (Guide Wire) IMPLANT
WIRE FIXATION OD.035 IN L6 IN KIRSCHNER STAINLESS STEEL 2 TROCAR (Guide Wire) IMPLANT
WIRE FIXATION OD.045 IN L6 IN KIRSCHNER (Guide Wire) IMPLANT
WIRE FIXATION OD.045 IN L6 IN KIRSCHNER STAINLESS STEEL 2 END TROCAR (Guide Wire) IMPLANT
WIRE FIXATION OD.062 IN L6 IN KIRSCHNER (Guide Wire) IMPLANT
WIRE FIXATION OD.062 IN L6 IN KIRSCHNER STAINLESS STEEL 2 TROCAR (Guide Wire) IMPLANT
WIRE FX SS KRSH .035IN 6IN NS 2 TROC (Guide Wire)
WIRE FX SS KRSH .045IN 6IN NS 2 END TROC (Guide Wire)
WIRE FX SS KRSH .062IN 6IN NS 2 TROC (Guide Wire)
WRAP CMPR FBR NTR RBR POR CBN 5YDX4IN (Procedure Accessories)
WRAP COBAN SELF ADHRNT 2INX5YD (Procedure Accessories) IMPLANT
WRAP COMPRESSION L5 YD X W4 IN SELF (Procedure Accessories) IMPLANT
WRAP COMPRESSION L5 YD X W4 IN SELF ADHERENT ELASTIC LIGHTWEIGHT FULL (Procedure Accessories) IMPLANT

## 2015-03-02 NOTE — H&P (Signed)
PLASTIC, RECONSTRUCTIVE, AND HAND SURGERY SERVICE  [facility] MD    HISTORY AND PHYSICAL EXAM    Date Time: 03/02/2015 7:07 AM  Patient Name: Ann Patterson  Attending Physician: Kellie Simmering, MD    Assessment:   64 yo F with L 5th Met displaced and angulated fx and 3rd MCPJ RCL tear    Plan:   Npo since MN.   Cleocin 600 mg IV preop  Continue NWB to the LUE    History of Present Illness:   Ann Patterson is a 64 y.o. female who presents to the hospital with presenting with L 5th Met fx and 3rd MCPJ RCL tear for 3 weeks now s/p a mechanical fall. Pain to the 5th Met midshaft region with radial laxity to the 3rd MCPJ. Pt was seen in the office and XR performed to prove the fx and the laxity, pt was immediately splinted and surgical management was advised.     Physical Exam:     Filed Vitals:    03/02/15 0649   BP: 107/68   Pulse: 77   Temp: 98.1 F (36.7 C)   Resp: 16   SpO2: 98%       Intake and Output Summary (Last 24 hours) at Date Time  No intake or output data in the 24 hours ending 03/02/15 0707    General appearance - alert, well appearing, and in no distress, oriented to person, place, and time and normal appearing weight  Mental status - alert, oriented to person, place, and time, normal mood, behavior, speech, dress, motor activity, and thought processes  Chest - clear to auscultation, no wheezes, rales or rhonchi, symmetric air entry  Heart - normal rate, regular rhythm, normal S1, S2, no murmurs, rubs, clicks or gallops  Abdomen - soft, nontender, nondistended, no masses or organomegaly  Extremities - no pedal edema noted, intact peripheral pulses, Volar splint in place, CDI. minimal swelling distal to the splint, intact neurovascular status to the digits.     Past Medical History:     Past Medical History   Diagnosis Date   . Polio    . Vertigo    . Hyperlipidemia    . Headache    . Meningitis spinal 1956     spinal and "regular   . Pneumonia 2015   . Low back pain    . Polio 1956       Past  Surgical History:     Past Surgical History   Procedure Laterality Date   . Hysterectomy     . Appendectomy     . Carpal tunnel release     . Back surgery     . Fracture surgery     . Repair, hand, nerve  12/10/2012     Procedure: REPAIR, HAND, NERVE;  Surgeon: Kellie Simmering, MD;  Location: [facility];  Service: Plastics;  Laterality: Left;  LEFT SMALL FINGER EXPLORATION; REPAIR DIGITAL NERVE       Family History:     Family History   Problem Relation Age of Onset   . Breast cancer Neg Hx    . Myocardial Infarction Mother    . Diverticulitis Sister    . Diabetes Maternal Grandmother    . Brain cancer Maternal Grandfather        Social History:     Social History     Social History   . Marital Status: Single     Spouse Name: N/A   . Number of  Children: N/A   . Years of Education: N/A     Social History Main Topics   . Smoking status: Current Every Day Smoker -- 0.50 packs/day for 30 years     Types: Cigarettes   . Smokeless tobacco: Not on file   . Alcohol Use: Yes      Comment: social  wine   . Drug Use: 2.00 per week     Special: Marijuana      Comment: uses fro pain rellief  will hold use 02/28/15   . Sexual Activity: Not on file     Other Topics Concern   . Not on file     Social History Narrative       Allergies:     Allergies   Allergen Reactions   . Gadolinium    . Iodine    . Penicillins        Medications:     Prescriptions prior to admission   Medication Sig   . aspirin 81 MG tablet Take 81 mg by mouth daily.     . cyclobenzaprine (FLEXERIL) 5 MG tablet Take 1-2 tablets (5-10 mg total) by mouth 3 (three) times daily as needed for Muscle spasms.   . fish oil-omega-3 fatty acids 1000 MG capsule Take 1 g by mouth daily.     . pregabalin (LYRICA) 150 MG capsule Take 1 capsule (150 mg total) by mouth 3 (three) times daily.   . raloxifene (EVISTA) 60 MG tablet Take 1 tablet (60 mg total) by mouth daily.   . clobetasol (TEMOVATE) 0.05 % ointment        Review of Systems:   A comprehensive review of systems  was: Negative except as noted in the HPI      Labs:     Results     ** No results found for the last 24 hours. **            Rads:   Radiological Procedure reviewed.    Signed by: Mingo Amber

## 2015-03-02 NOTE — Transfer of Care (Signed)
Anesthesia Transfer of Care Note    Patient: Ann Patterson    Procedures performed: Procedure(s) with comments:  REPAIR, LIGAMENT - LEFT MIDDLE FINGER RADIAL COLLATERAL LIGAMENT REPAIR  ORIF, FINGER - LEFT SMALL FINGER METACARPAL ORIF    Anesthesia type: General LMA    Patient location:Phase I PACU    Last vitals:   Filed Vitals:    03/02/15 0917   BP: 122/73   Pulse: 83   Temp: 36.1 C (97 F)   Resp: 12   SpO2: 100%       Post pain: Patient not complaining of pain, continue current therapy      Mental Status:awake    Respiratory Function: tolerating face mask    Cardiovascular: stable    Nausea/Vomiting: patient not complaining of nausea or vomiting    Hydration Status: adequate    Post assessment: no apparent anesthetic complications, no reportable events and no evidence of recall

## 2015-03-02 NOTE — Anesthesia Preprocedure Evaluation (Addendum)
Anesthesia Evaluation    AIRWAY    Mallampati: II    TM distance: >3 FB  Neck ROM: full  Mouth Opening:full   CARDIOVASCULAR    cardiovascular exam normal       DENTAL        (+) lower dentures and upper dentures   PULMONARY    pulmonary exam normal     OTHER FINDINGS    Scab on right side of mouth, patient notes that she scratched herself, ? Herpes lesion                Anesthesia Plan    ASA 2     general                     intravenous induction   Detailed anesthesia plan: general LMA        Post op pain management: per surgeon    informed consent obtained      pertinent labs reviewed

## 2015-03-02 NOTE — Discharge Instructions (Signed)
PLASTIC SURGERY INSTRUCTIONS:         1. Leave splint and dressings ON, CLEAN, DRY.  2. Bag over dressings to bathe. Do not get dressings wet.   3. Start moving your left small finger.  4. Elevate left hand to minimize swelling.  5. It is important that you quit smoking.  6. Call Dr. Garry Heater office to schedule an appointment for next week.     Discharge Instructions: After Your Surgery  You've just had surgery. During surgery you were given medicine called anesthesia to keep you relaxed and free of pain. After surgery you may have some pain or nausea. This is common. Here are some tips for feeling better and getting well after surgery.    Going home  Your doctor or nurse will show you how to take care of yourself when you go home. He or she will also answer your questions. Have an adult family member or friend drive you home. For the first 24 hours after your surgery:   Do not drive or use heavy equipment.   Do not make important decisions or sign legal papers.   Do not drink alcohol.   Have someone stay with you, if needed. He or she can watch for problems and help keep you safe.  Be sure to go to all follow-up visits with your doctor. And rest after your surgery for as long as your doctor tells you to.  Coping with pain  If you have pain after surgery, pain medicine will help you feel better. Take it as told, before pain becomes severe. Also, ask your doctor or pharmacist about other ways to control pain. This might be with heat, ice, or relaxation. And follow any other instructions your surgeon or nurse gives you.  Tips for taking pain medicine  To get the best relief possible, remember these points:   Pain medicines can upset your stomach. Taking them with a little food may help.   Most pain relievers taken by mouth need at least 20 to 30 minutes to start to work.   Taking medicine on a schedule can help you remember to take it. Try to time your medicine so that you can take it before starting an  activity. This might be before you get dressed, go for a walk, or sit down for dinner.   Constipation is a common side effect of pain medicines. Call your doctor before taking any medicines such as laxatives or stool softeners to help ease constipation. Also ask if you should skip any foods. Drinkinglots of fluids andeating foodssuch as fruits and vegetables that are high in fiber can also help. Remember, do not take laxatives unless your surgeon has prescribed them.   Drinking alcohol and taking pain medicine can cause dizziness and slow your breathing. It can even be deadly. Do not drink alcohol while taking pain medicine.   Pain medicine can make you react more slowly to things. Do not drive or run machinery while taking pain medicine.  Your health care providermay tell you to take acetaminophen to help ease your pain. Ask him or her how much you are supposed to take each day. Acetaminophen or other pain relievers may interact with your prescription medicines or other over-the-counter (OTC) drugs. Some prescription medicines have acetaminophen and other ingredients.Using both prescription and OTC acetaminophenfor paincan cause you to overdose. Readthe labels on your OTC medicineswith care. This will help youto clearly know the list of ingredients, how much to take, and anywarnings. It  may also help you not take too muchacetaminophen.If you have questions or do not understand the information, ask your pharmacist or health care provider to explain it to you before you take the OTC medicine.  Managing nausea  Some people have an upset stomach after surgery. This is often because of anesthesia, pain, or pain medicine, or the stress of surgery. These tips will help you handle nausea and eat healthy foods as you get better. If you were on a special food plan before surgery, ask your doctor if you should follow it while you get better. These tips may help:   Do not push yourself to eat. Your body will  tell you when to eat and how much.   Start off with clear liquids and soup. They are easier to digest.   Next try semi-solid foods, such as mashed potatoes, applesauce, and gelatin, as you feel ready.   Slowly move to solid foods. Don't eat fatty, rich, or spicy foods at first.   Do not force yourself to have 3 large meals a day. Instead eat smaller amounts more often.   Take pain medicines with a small amount of solid food, such as crackers or toast, to avoid nausea.         If you have obstructive sleep apnea  You were given anesthesia medicine during surgery to keep you comfortable and free of pain. After surgery, you may have more apnea spells because of this medicine and other medicines you were given. The spells may last longer than usual.  At home:   Keep using the continuous positive airway pressure (CPAP) device when you sleep. Unless your health care provider tells you not to, use it when you sleep, day or night. CPAP is a common device used to treat obstructive sleep apnea.   Talk with your provider before taking any pain medicine, muscle relaxants, or sedatives. Your provider will tell you about the possible dangers of taking these medicines.   2000-2015 [employer] [address], [place] [address]. All rights reserved. This information is not intended as a substitute for professional medical care. Always follow your healthcare professional's instructions.

## 2015-03-02 NOTE — Brief Op Note (Signed)
BRIEF OP NOTE    Date Time: 03/02/2015 9:11 AM        Patient Name:   Ann Patterson    Date of Operation:   03/02/2015    Providers Performing:   Surgeon(s):  Kellie Simmering, MD  Mingo Amber, DPM  Elyse Hsu, [place]    Assistant (s):   Circulator: Kinnie Scales, RN  Scrub Person: Fritz Pickerel, RN  Preceptor: Major, Karie Chimera, RN    Operative Procedure:   Procedure(s):    1. LEFT SMALL FINGER ORIF SMALL FINGER METACARPAL BONE  2. NEUROLYSIS ULNAR  NERVE  3. LEFT MIDDLE FINGER MP COLLATERAL LIGAMENT REPAIR  4. ZPLASTY AS ADJACENT TISSUE TRANSFER 1X2 CM     Preoperative Diagnosis:   Pre-Op Diagnosis Codes:     * Digital nerve laceration, finger, initial encounter [S64.40XA]     * Laceration of finger with foreign body, initial encounter [S61.229A]     * Avulsion of hand (no fingers), left, initial encounter [S61.402A]     * Extensor hood laceration of finger with open wound, initial encounter [Y78.295A, S61.209A]    Postoperative Diagnosis:   SAME    Anesthesia:   TYPE OF ANESTHESIA: GEN LMA    DVT PROPHYLAXIS:   SEQUENTIAL DEVICE: YES   CHEMOPROPHYLAXIS:  NO    Estimated Blood Loss:    2 mL    Implants:     Implant Name Type Inv. Item Serial No. Manufacturer Lot No. LRB No. Used Action   PLATE ES MIDFACE . THK - [id number] Plate PLATE ES MIDFACE . THK  SYNTHES CRANIAL MAX FACIAL  Left 1 Implanted   6 mm self drilling screw      Left 6 Implanted   emergency 6 mm screw      Left 1 Implanted   ANCHOR MICROFIX PLUS 4/0 SUT - [id number] Anchor ANCHOR MICROFIX PLUS 4/0 SUT   DEPUY MITEK [id number] Left 1 Implanted       Drains:   Drains: no    Specimens:       Findings:   N/A    Complications:   NONE      Signed by: Kellie Simmering, MD

## 2015-03-02 NOTE — Anesthesia Postprocedure Evaluation (Signed)
Anesthesia Post Evaluation    Patient: Ann Patterson    Procedure(s) with comments:  REPAIR, LIGAMENT - LEFT MIDDLE FINGER RADIAL COLLATERAL LIGAMENT REPAIR  ORIF, FINGER - LEFT SMALL FINGER METACARPAL ORIF    Anesthesia type: general    Last Vitals:   Filed Vitals:    03/02/15 1049   BP: 166/85   Pulse:    Temp: 36.6 C (97.8 F)   Resp: 16   SpO2: 95%       Patient Location: Phase I PACU      Post Pain: Patient not complaining of pain, continue current therapy    Mental Status: awake and sedated    Respiratory Function: tolerating nasal cannula    Cardiovascular: stable    Nausea/Vomiting: patient not complaining of nausea or vomiting    Hydration Status: adequate    Post Assessment: no apparent anesthetic complications, no reportable events and no evidence of recall          Anesthesia Qualified Clinical Data Registry    Central Line      CVC insertion : NO                                               Perioperative temperature management      General/neuraxial anesthesia > or = 60 minutes (excluding CABG) : YES              > Use of intraoperative active warming : YES              > Temperature > or = 36 degrees Centigrade (96.8 degrees Farenheit) during time span from 30 minutes before up to 15 minutes after anesthesia end time : YES      Administration of antibiotic prophylaxis      Age > or = 18, with IV access, with surgical procedure for which antibiotic prophylaxis indicated, and not on chronic antibiotics : YES              > Prophylactic antibiotics within 1 hour of incision (or fluroroquinolone/vancomycin within 2 hours of incision) : YES    Medication Administration      Ordering or administration of drug inconsistent with intended drug, dose, delivery or timing : NO      Dental loss/damage      Dental injury with administration of anesthesia : NO      Difficult intubation due to unrecognized difficult airway        Elective airway procedure including but not limited to: tracheostomy, fiberoptic  bronchoscopy, rigid bronchoscopy; jet ventilation; or elective use of a device to facilitate airway management such as a Glidescope : NO                > Unanticipated difficult intubation post pre-evaluation : NO      Aspiration of gastric contents        Aspiration of gastric contents : NO                    Surgical fire        Procedure requiring electrocautery/laser : YES                > Ignition/burning in invasive procedure location : NO      Immediate perioperative cardiac arrest        Cardiac arrest in OR or  PACU : NO                    Unplanned hospital admission        Unplanned hospital admission for initially intended outpatient anesthesia service : NO      Unplanned ICU admission        Unplanned ICU admission related to anesthesia occurring within 24 hours of induction or start of MAC : NO      Surgical case cancellation        Cancellation of procedure after care already started by anesthesia care team : NO      Post-anesthesia transfer of care checklist/protocol to PACU        Transfer from OR to PACU upon case conclusion : YES              > Use of PACU transfer checklist/protocol : YES     (Includes the key elements of: patient identification, responsible practitioner identification (PACU nurse or advanced practitioner), discussion of pertinent history and procedure course, intraoperative anesthetic management, post-procedure plans, acknowledgement/questions)    Post-anesthesia transfer of care checklist/protocol to ICU        Transfer from OR to ICU upon case conclusion : NO                    Post-operative nausea/vomiting risk protocol        Post-operative nausea/vomiting risk protocol : YES  Patient > or = 18 with care initiated by anesthesia team that has a risk factor screen for post-op nausea/vomiting (Includes female, hx PONV, or motion sickness, non-smoker, intended opioid administration for post-op analgesia.)    Anaphylaxis        Anaphylaxis during anesthesia services : NO     (Inclusive of any suspected transfusion reaction in association with blood-bank confirmed blood product incompatibility)              Jovita Gamma, 03/02/2015 11:27 AM

## 2015-03-05 ENCOUNTER — Encounter: Payer: Self-pay | Admitting: Plastic Surgery

## 2015-03-06 NOTE — Op Note (Signed)
Procedure Date: 03/02/2015     Patient Type: A     SURGEON: Kellie Simmering MD  ASSISTANT:  Stefanie Libel PA     PREOPERATIVE DIAGNOSES:  1.  Left small finger metacarpal bone fracture, displaced, midshaft.  2.  Pain left upper extremity.    3.  Left middle finger radial collateral ligament rupture on the MP joint.     POSTOPERATIVE DIAGNOSES:  1.  Left small finger metacarpal bone fracture, displaced, midshaft.  2.  Pain left upper extremity.    3.  Left middle finger radial collateral ligament rupture on the MP joint.     TITLE OF PROCEDURE:  1.  Left small finger open treatment of small finger metacarpal bone.  2.  Neurolysis of dorsal sensory ulnar nerve.    3.  Left middle finger MP collateral ligament repair direct with a micro  Mitek bone anchor.    4.  Z-plasty as an adjacent tissue transfer on the hand 1 x 2 cm.       ANESTHESIA:  General LMA.     ESTIMATED BLOOD LOSS:  Minimal 2 mL.     PACKS  None.     DRAINS:  No drains.     SPECIMENS:  None.     COMPLICATIONS:  No complications.     CONDITION:  The patient stable to PACU.     PREOPERATIVE INDICATIONS:  The patient is a 64 year old female who presents with a hand injury  resulting in 2 separate and distinct diagnoses.  It appears that she has a  collateral ligament rupture on the middle finger, radial collateral  ligament at the MP joint.  She also has a fracture of the small finger in  the shaft of the metacarpal bone.  I have recommended surgical approach on  both of these injuries.  I have discussed the planned procedure as well as  the risks, benefits, and alternatives.  Given the patient's history for  previous hand injury, I feel that she is at best a candidate for early  motion to allow for best functional outcome.  I have discussed the planned  procedure as well as the risks, benefits, and alternatives, risks discussed  including, but not limited to bleeding, infection, possible need for  further surgery, possible damage to surrounding structures,  possible  decreased function, pain and scarring, possible flap or graft failure,  possible rupture of repairs, possible nonunion, malunion, malreduction  and/or rotational deformity.  Risks, benefits and alternatives were  discussed.  All questions were answered and informed consent was obtained  by me preoperatively.     DESCRIPTION OF PROCEDURE:  The patient was preoped and brought to the operating room, identified as  the patient by the operating surgeon, placed in the supine position on the  operating room table, and placed under general anesthesia via the laryngeal  mask airway by the department of anesthesia.  Perioperative antibiotics  were on board.  Pressure points were cushioned.  SCDs were on and  functional prior to induction of anesthesia.  Left upper extremity was out  at 90 degrees.  Upper arm tourniquet was placed.  It was prepped and draped  in usual sterile fashion.  Complete timeout procedure was done.  We began  by Esmarch exsanguination and tourniquet inflation, the correct  identification of the surgical sites.  A longitudinal incision was made  over the small finger metacarpal bone.  It was incised with 15 blade and  deepened with electrocautery down to  the extensor tendon, which was  retracted sharply.  It was dissected off the periosteum on the deep side.   Periosteum was incised and then elevator was used to elevate the periosteum  off the bone. A fracture line was identified, curetted, and reduced.  We  then dissected proximally and distally to ensure that there was adequate  room for plating.  A Synthes Matrix strut plate was selected.  It was  curved into position, placed on top of the fracture line and monocortical  plate fixation with 6-mm screws was done without difficulty with good  fixation and stability of the fracture line.  We flexed the fracture  throughout the full range of motion with no motion on the fracture line and  a good reduction without any evidence of significant  scissoring.  The  periosteum was closed with 5-0 PDS.  Extensor tendon was allowed to retract  back into position.  Skin was closed with 5-0 Monocryl in a running  horizontal mattress fashion, which was reinforced with additional  interrupted sutures.  Prior to complete closure, we did do a neurolysis as  there was an ulnar nerve immediately in the area and it required a  neurolysis as there were some inflammatory changes in the area and we were  able to ensure that it was completely lysed and free of the surrounding  inflammatory changes.  We then did the skin repair as described above.   Attention was then turned towards the left middle finger.  Incision was  made over the radial collateral ligament at the MP joint with longitudinal  incision.  Dissection was taken down to the extensor retinaculum and the  hood.  The hood was incised laterally to leave a cuff to allow it to be  repaired on the closure.  The collateral ligament was then approached and  found to be completely disrupted off the metacarpal bone.  We identified  the collateral ligament and a portion where it had been ruptured off the  metacarpal bone.  A micro Mitek bone anchor was placed at this site.  It  was sutured to the collateral ligament, restoring the functional stability  to the joint and allowing for adequate ulnar stressing of the middle finger  without rupture of the repair.  The extensor hood was repaired with 4-0 and  5-0 PDS sutures in horizontal mattress fashion.  We felt that the skin was  to the radial side of the MP joint, but could cause a longitudinal  contracture and therefore, a Z-plasty was done at 45 degrees and inset with  5-0 Monocryl sutures.  Remaining incision was closed with 5-0 Monocryl  suture in a horizontal mattress interrupted and running fashion.  The size  of the Z-plasty was 1 x 2 cm.  The patient then had dressings applied, was  brought out of anesthesia and transferred to PACU in stable condition,  having  tolerated this procedure well.           D:  03/06/2015 15:38 PM by Dr. Kellie Simmering, MD ([id number])  T:  03/06/2015 15:52 PM by [id number]      Everlean Cherry: [id number]) (Doc ID: [id number])

## 2015-04-03 ENCOUNTER — Ambulatory Visit (INDEPENDENT_AMBULATORY_CARE_PROVIDER_SITE_OTHER): Payer: Medicare Other | Admitting: Vascular Neurology

## 2015-04-03 ENCOUNTER — Encounter (INDEPENDENT_AMBULATORY_CARE_PROVIDER_SITE_OTHER): Payer: Self-pay | Admitting: Vascular Neurology

## 2015-04-03 VITALS — BP 92/63 | HR 84 | Resp 20 | Ht 61.0 in | Wt 89.6 lb

## 2015-04-03 DIAGNOSIS — M5412 Radiculopathy, cervical region: Secondary | ICD-10-CM

## 2015-04-03 DIAGNOSIS — G5603 Carpal tunnel syndrome, bilateral upper limbs: Secondary | ICD-10-CM

## 2015-04-03 DIAGNOSIS — M542 Cervicalgia: Secondary | ICD-10-CM

## 2015-04-03 MED ORDER — PREGABALIN 150 MG PO CAPS
150.0000 mg | ORAL_CAPSULE | Freq: Three times a day (TID) | ORAL | Status: DC
Start: 2015-04-03 — End: 2015-06-25

## 2015-04-03 NOTE — Progress Notes (Signed)
Subjective:      Patient ID: Ann Patterson is a 64 y.o. female.    HPI  Since I last saw the patient, she had an accident with a dog and broke her left hand.  This required surgery.  Otherwise, she says her neck has been getting better.  She is able to turn into the right although still has numbness and tingling into the right arm at times.  Overall she is improved .  The Lyrica has been helping and retrospectively, she knowledge is a physical therapy did help as well.  She only uses Flexeril as needed.      Review of Systems  Constitutional: Negative for fever.   Respiratory: Negative for shortness of breath.    Cardiovascular: Negative for chest pain.   Gastrointestinal: Negative for abdominal pain.   Neurological: Negative for dizziness, weakness, numbness and headaches.   Psychiatric/Behavioral: Negative for sleep disturbance.       Objective:   Neurologic Exam  Mental Status: The patient was awake, alert, appeared oriented and was appropriate. Speech was fluent and the patient followed commands well. Attention, concentration, and memory appeared intact. Fund of knowledge appeared appropriate for education level.   Cranial Nerves: Pupils were equally round and reactive to light bilaterally. Extraocular movements were intact without nystagmus. Hearing was intact to conversational speech. Face was symmetric. Tongue appeared midline.   Motor: Good/full strength throughout without clear focality or weakness. Bulk appeared normal for body habitus. No obvious tremor noted. L hand braced/splinted - limited exam.  Sensation: light touch was grossly intact.   Coordination: Finger to nose testing was intact without dysmetria.   Gait: Normal and steady.      Assessment:     1. Neck pain    2. Cervical radiculopathy    3. Carpal tunnel syndrome on both sides          Plan:     1.  Continue Lyrica 150 mg 3 times a day.  2.  Physical therapy exercises as able to.  3.  Follow-up with me in 6 months or contact the office  sooner if needed.    The patient will return for follow-up as outlined above. If there are any problems with medications (if prescribed) or questions about any available test results, or if there are any new symptoms or changes in current symptoms, the patient is instructed to call the office.     Brenae Lasecki B. Stacy Gardner, MD  Board Certified, Neurology  Board Certified, Clinical Neurophysiology  Board Certified, Vascular Neurology  Board Certified, Sleep Medicine

## 2015-06-25 ENCOUNTER — Ambulatory Visit (INDEPENDENT_AMBULATORY_CARE_PROVIDER_SITE_OTHER): Payer: Medicare Other | Admitting: Vascular Neurology

## 2015-06-25 ENCOUNTER — Encounter (INDEPENDENT_AMBULATORY_CARE_PROVIDER_SITE_OTHER): Payer: Self-pay | Admitting: Vascular Neurology

## 2015-06-25 ENCOUNTER — Other Ambulatory Visit (INDEPENDENT_AMBULATORY_CARE_PROVIDER_SITE_OTHER): Payer: Self-pay

## 2015-06-25 VITALS — BP 110/73 | HR 85 | Ht 61.0 in | Wt 86.0 lb

## 2015-06-25 DIAGNOSIS — M542 Cervicalgia: Secondary | ICD-10-CM

## 2015-06-25 DIAGNOSIS — M5412 Radiculopathy, cervical region: Secondary | ICD-10-CM

## 2015-06-25 DIAGNOSIS — G5603 Carpal tunnel syndrome, bilateral upper limbs: Secondary | ICD-10-CM

## 2015-06-25 DIAGNOSIS — G243 Spasmodic torticollis: Secondary | ICD-10-CM

## 2015-06-25 MED ORDER — PREGABALIN 150 MG PO CAPS
150.0000 mg | ORAL_CAPSULE | Freq: Three times a day (TID) | ORAL | Status: DC
Start: 2015-06-25 — End: 2015-11-17

## 2015-06-25 MED ORDER — METHOCARBAMOL 500 MG PO TABS
500.0000 mg | ORAL_TABLET | Freq: Three times a day (TID) | ORAL | Status: DC | PRN
Start: 2015-06-25 — End: 2015-08-18

## 2015-06-25 NOTE — Progress Notes (Signed)
Subjective:      Patient ID: Ann Patterson is a 65 y.o. female.    HPI  At the last visit, the patient had been doing well, but tells me she had a setback.  She complains of pain in the right side of her neck which extends down the right arm, and reports muscle spasms in the right neck.  She feels like her neck pulls at times.  She says her right arm feels heavy at times.  She had stopped physical therapy, and cannot afford to pursue this at this point.  She notes her arms uncomfortable when moving at times.    Review of her MRI of the cervical spine from February 2015 shows that it was stable with postoperative and degenerative changes without mass effect upon the spinal cord or significant canal encroachment.  There were multiple levels of mild to moderate foraminal narrowing noted, but overall there is no significant change from the prior study.      Review of Systems  Constitutional: Negative for fever.   Respiratory: Negative for shortness of breath.    Cardiovascular: Negative for chest pain.   Gastrointestinal: Negative for abdominal pain.   Neurological: Negative for dizziness, weakness and headaches.   Psychiatric/Behavioral: Negative for sleep disturbance.       Objective:   Neurologic Exam  Mental Status: The patient was awake, alert, appeared oriented and was appropriate. Speech was fluent and the patient followed commands well. Attention, concentration, and memory appeared intact. Fund of knowledge appeared appropriate for education level.   Cranial Nerves: Pupils were equally round and reactive to light bilaterally. Extraocular movements were intact without nystagmus. Hearing was intact to conversational speech. Face was symmetric. Tongue appeared midline. Head slightly pulled to right.  Motor: Good/full strength throughout without clear focality or weakness. Bulk appeared normal for body habitus. No obvious tremor noted.   Sensation: light touch was grossly intact.   Coordination: Finger to nose  testing was intact without dysmetria.   Gait: Normal and steady.      Assessment:     1. Neck pain    2. Cervical radiculopathy    3. Cervical dystonia    4. Carpal tunnel syndrome on both sides          Plan:     1.  Continue Lyrica at current dose.  2.  Trial of Robaxin 500 mg 3 times a day.  3.  We will seek approval for Botox for cervical dystonia, she was noted to have some rightward head pulling on exam.  4.  Physical therapy when able to.  5.  Follow-up with me in about 6-8 weeks or contact the office sooner if needed.    The patient will return for follow-up as outlined above. If there are any problems with medications (if prescribed) or questions about any available test results, or if there are any new symptoms or changes in current symptoms, the patient is instructed to call the office.     Leonidus Rowand B. Stacy Gardner, MD  Board Certified, Neurology  Board Certified, Clinical Neurophysiology  Board Certified, Vascular Neurology  Board Certified, Sleep Medicine

## 2015-06-29 ENCOUNTER — Other Ambulatory Visit (INDEPENDENT_AMBULATORY_CARE_PROVIDER_SITE_OTHER): Payer: Self-pay

## 2015-06-29 DIAGNOSIS — M5412 Radiculopathy, cervical region: Secondary | ICD-10-CM

## 2015-06-29 MED ORDER — TIZANIDINE HCL 2 MG PO CAPS
2.0000 mg | ORAL_CAPSULE | Freq: Three times a day (TID) | ORAL | Status: DC
Start: 2015-06-29 — End: 2015-08-18

## 2015-07-06 ENCOUNTER — Encounter (INDEPENDENT_AMBULATORY_CARE_PROVIDER_SITE_OTHER): Payer: Self-pay

## 2015-08-11 ENCOUNTER — Encounter (INDEPENDENT_AMBULATORY_CARE_PROVIDER_SITE_OTHER): Payer: Self-pay

## 2015-08-11 ENCOUNTER — Ambulatory Visit (INDEPENDENT_AMBULATORY_CARE_PROVIDER_SITE_OTHER): Payer: Medicare Other | Admitting: Vascular Neurology

## 2015-08-11 NOTE — Progress Notes (Signed)
Patient arrived for her appointment 3 hours early expecting to be seen ASAP. Advised patient that Dr. Stacy Gardner was seeing patients at the moment and could not take time away from the ones who were here on time. Pt became loud, yelling and left office. Returned to office 1 hour later stating "I need my refill for a muscle relaxer and I need it now." Again, advised she would not get a prescription now because Dr. Stacy Gardner was seeing patients and will not be interrupted. Patient became loud, yelling "I hate to find a new neurologist after 5 years of this ****. He never gives me what I want and looks at me like he doesn't like me. He works for me. We pay his bills." Pt asked to leave office as to not disrupt our patients who were in the reception area.

## 2015-08-18 ENCOUNTER — Ambulatory Visit (INDEPENDENT_AMBULATORY_CARE_PROVIDER_SITE_OTHER): Payer: Medicare Other | Admitting: Vascular Neurology

## 2015-08-18 ENCOUNTER — Encounter (INDEPENDENT_AMBULATORY_CARE_PROVIDER_SITE_OTHER): Payer: Self-pay | Admitting: Vascular Neurology

## 2015-08-18 VITALS — BP 105/66 | HR 92 | Ht 61.0 in | Wt 85.6 lb

## 2015-08-18 DIAGNOSIS — M5412 Radiculopathy, cervical region: Secondary | ICD-10-CM

## 2015-08-18 DIAGNOSIS — G243 Spasmodic torticollis: Secondary | ICD-10-CM

## 2015-08-18 DIAGNOSIS — M542 Cervicalgia: Secondary | ICD-10-CM

## 2015-08-18 DIAGNOSIS — G5603 Carpal tunnel syndrome, bilateral upper limbs: Secondary | ICD-10-CM

## 2015-08-18 MED ORDER — CYCLOBENZAPRINE HCL 10 MG PO TABS
10.0000 mg | ORAL_TABLET | Freq: Three times a day (TID) | ORAL | Status: DC | PRN
Start: 2015-08-18 — End: 2015-11-17

## 2015-08-18 NOTE — Progress Notes (Signed)
Subjective:      Patient ID: Ann Patterson is a 65 y.o. female.    HPI     The patient tells me she is doing a lot better now.  She reports using Flexeril up to 20 mg twice daily, and says this helps with her pain.  She also continues on Lyrica 150 mg 3 times a day which works well for her, she says it "helps tremendously".  She does feel pulling of her head to the right side and says her neck wants to move to the right.  If she lets ago, she gets pain shooting down the right arm.  She has not had physical therapy yet secondary to financial reasons.    Review of Systems  Constitutional: Negative for fever.   Respiratory: Negative for shortness of breath.    Cardiovascular: Negative for chest pain.   Gastrointestinal: Negative for abdominal pain.   Neurological: Negative for dizziness, weakness, numbness and headaches.   Psychiatric/Behavioral: Negative for sleep disturbance.       Objective:   Neurologic Exam  Mental Status: The patient was awake, alert, appeared oriented and was appropriate. Speech was fluent and the patient followed commands well. Attention, concentration, and memory appeared intact. Fund of knowledge appeared appropriate for education level.   Cranial Nerves: Pupils were equally round and reactive to light bilaterally. Extraocular movements were intact without nystagmus. Hearing was intact to conversational speech. Face was symmetric. Tongue appeared midline.   Motor: Good/full strength throughout without clear focality or weakness. Bulk appeared normal for body habitus. No obvious tremor noted.   Sensation: light touch was grossly intact.   Coordination: Finger to nose testing was intact without dysmetria.   Gait: Normal and steady.      Assessment:     1. Neck pain    2. Neck pain on right side    3. Cervical radiculopathy    4. Cervical dystonia    5. Carpal tunnel syndrome on both sides          Plan:     1.  I explained to her that the maximum Flexeril.  I would like her to take his 10 mg  3 times a day as needed.  2.  Continue Lyrica 150 mg 3 times a day.  This could be increased a bit if needed.  3.  She did not want to do Botox at this time, but instead wanted to see how she did with the higher Flexeril dose.  4.  Physical therapy as able to.  5.  Follow-up with me in 2 or 3 months for reassessment or contact the office sooner if needed.    The patient will return for follow-up as outlined above. If there are any problems with medications (if prescribed) or questions about any available test results, or if there are any new symptoms or changes in current symptoms, the patient is instructed to call the office.     Marciana Uplinger B. Stacy Gardner, MD  Board Certified, Neurology  Board Certified, Clinical Neurophysiology  Board Certified, Vascular Neurology  Board Certified, Sleep Medicine

## 2015-09-01 ENCOUNTER — Emergency Department
Admission: EM | Admit: 2015-09-01 | Discharge: 2015-09-01 | Disposition: A | Discharge status: Home / Self Care | Payer: Medicare Other | Attending: Emergency Medicine | Admitting: Emergency Medicine

## 2015-09-01 ENCOUNTER — Emergency Department: Payer: Medicare Other

## 2015-09-01 DIAGNOSIS — Z8661 Personal history of infections of the central nervous system: Secondary | ICD-10-CM | POA: Insufficient documentation

## 2015-09-01 DIAGNOSIS — M62838 Other muscle spasm: Secondary | ICD-10-CM | POA: Insufficient documentation

## 2015-09-01 DIAGNOSIS — E785 Hyperlipidemia, unspecified: Secondary | ICD-10-CM | POA: Insufficient documentation

## 2015-09-01 DIAGNOSIS — Z7982 Long term (current) use of aspirin: Secondary | ICD-10-CM | POA: Insufficient documentation

## 2015-09-01 MED ORDER — TRAMADOL HCL 50 MG PO TABS
100.0000 mg | ORAL_TABLET | Freq: Four times a day (QID) | ORAL | Status: DC | PRN
Start: 2015-09-01 — End: 2015-09-01
  Administered 2015-09-01: 100 mg via ORAL
  Filled 2015-09-01: qty 2

## 2015-09-01 MED ORDER — TRAMADOL HCL 50 MG PO TABS
50.0000 mg | ORAL_TABLET | Freq: Four times a day (QID) | ORAL | Status: DC | PRN
Start: 2015-09-01 — End: 2016-10-28

## 2015-09-01 NOTE — Discharge Instructions (Signed)
Dear Ann Patterson:    Thank you for choosing the [facility] Emergency Department, the premier emergency department in the [place] area.  I hope your visit today was EXCELLENT.    Specific instructions for your visit today:    Increase your Lyrica dose to 200 mg twice a day.      Muscle Spasms    You have been diagnosed with muscle spasms.    A muscle spasm means that your muscles feel tight, crampy or painful. Many people have trouble relaxing their muscles when this happens. Most people will get a muscle spasm at some point.     There are many things that can cause muscle spasms. Some of them are:   Too much exercise.   Dehydration (often caused by heat exposure).   Electrolyte imbalance (low potassium, magnesium or phosphorus).   Changes in body fluids that can happen with liver or kidney failure.   Drug addiction and withdrawal.   Deficiency (not enough) of certain vitamins.   Peripheral Vascular Disease (narrow blood vessels in the legs).   Certain medications like furosemide (Lasix), albuterol (Proventil), cholesterol medications, and others.    You might need another exam or more tests to find out why you have these symptoms. At this time, the cause of your symptoms doesn't seem dangerous and you don t need to stay in the hospital.    Though we don't believe your condition is dangerous right now, it is important to be careful. Sometimes a problem that seems mild can become serious later. This is why it is very important that you return here or go to the nearest Emergency Department if you are not improving or your symptoms are getting worse.    Some things you may try at home are:   Stretching.   Over-the-counter pain medications that have ibuprofen (Advil/Motrin) or acetaminophen (Tylenol) in them. Follow the directions on the package.   Massage.   Warm baths.   Eating a healthy and balanced diet.   Get plenty of rest.   Drink lots of liquids.   Exercise or activity if you  are careful.    Follow the instructions for any medication you are prescribed.    Follow up with your doctor if you are not getting better as expected.    YOU SHOULD SEEK MEDICAL ATTENTION IMMEDIATELY, EITHER HERE OR AT THE NEAREST EMERGENCY DEPARTMENT, IF ANY OF THE FOLLOWING HAPPENS:     You have a fever (temperature higher than 100.26F or 38C).   Your pain does not go away or gets worse.   Your urine (pee) turns red. This is a sign of muscle breakdown.   You do not feel better after treatment.   You have other symptoms, concerns, or don t get better as expected.    If you can t follow up with your doctor, or if at any time you feel you need to be rechecked or seen again, come back here or go to the nearest emergency department.                If you do not continue to improve or your condition worsens, please contact your doctor or return immediately to the Emergency Department.    Sincerely,  Kopack, Mont Dutton, MD  Attending Emergency Physician  [facility] Emergency Department    ONSITE PHARMACY  Our full service onsite pharmacy is located in the ER waiting room.  Open 7 days a week from 9 am to  11 pm.  We accept all major insurances and prices are competitive with major retailers.  Ask your provider to print your prescriptions down to the pharmacy to speed you on your way home.    OBTAINING A PRIMARY CARE APPOINTMENT    Primary care physicians (PCPs, also known as primary care doctors) are either internists or family medicine doctors. Both types of PCPs focus on health promotion, disease prevention, patient education and counseling, and treatment of acute and chronic medical conditions.    Call for an appointment with a primary care doctor.  Ask to see who is taking new patients.     [facility]  telephone:  [phone]  [email or web address]    DOCTOR REFERRALS  Call [phone] (available 24 hours a day, 7 days a week) if you need any further referrals and we can help you  find a primary care doctor or specialist.  Also, available online at:  [email or web address]    YOUR CONTACT INFORMATION  Before leaving please check with registration to make sure we have an up-to-date contact number.  You can call registration at [phone] to update your information.  For questions about your hospital bill, please call [phone].  For questions about your Emergency Dept Physician bill please call [phone].      FREE HEALTH SERVICES  If you need help with health or social services, please call 2-1-1 for a free referral to resources in your area.  2-1-1 is a free service connecting people with information on health insurance, free clinics, pregnancy, mental health, dental care, food assistance, housing, and substance abuse counseling.  Also, available online at:  http://www.[facility].org    MEDICAL RECORDS AND TESTS  Certain laboratory test results do not come back the same day, for example urine cultures.   We will contact you if other important findings are noted.  Radiology films are often reviewed again to ensure accuracy.  If there is any discrepancy, we will notify you.      Please call [phone] to pick up a complimentary CD of any radiology studies performed.  If you or your doctor would like to request a copy of your medical records, please call [phone].      ORTHOPEDIC INJURY   Please know that significant injuries can exist even when an initial x-ray is read as normal or negative.  This can occur because some fractures (broken bones) are not initially visible on x-rays.  For this reason, close outpatient follow-up with your primary care doctor or bone specialist (orthopedist) is required.    MEDICATIONS AND FOLLOWUP  Please be aware that some prescription medications can cause drowsiness.  Use caution when driving or operating machinery.    The examination and treatment you have received in our Emergency Department is provided on an emergency basis,  and is not intended to be a substitute for your primary care physician.  It is important that your doctor checks you again and that you report any new or remaining problems at that time.      24 HOUR PHARMACIES  The nearest 24 hour pharmacy is:    [facility]  [address]  [place], [place] [address]  [phone]      ASSISTANCE WITH INSURANCE    Affordable Care Act  [facility])  Call to start or finish an application, compare plans, enroll or ask a question.  [phone]  TTY: [phone]  Web:  Healthcare.gov    Help Enrolling in [facility]  [employer] [place]  402 010 2350)  803-066-1646 (TOLL-FREE)  6187722951 (TTY)  Web:  Http://www.coverva.org    Local Help Enrolling in the [facility]  [place] [facility]  825-609-4354 (MAIN)  Email:  health-help@nvfs .org  Web:  BlackjackMyths.is  Address:  [address], [address] [place], [place] [address]    SEDATING MEDICATIONS  Sedating medications include strong pain medications (e.g. narcotics), muscle relaxers, benzodiazepines (used for anxiety and as muscle relaxers), Benadryl/diphenhydramine and other antihistamines for allergic reactions/itching, and other medications.  If you are unsure if you have received a sedating medication, please ask your physician or nurse.  If you received a sedating medication: DO NOT drive a car. DO NOT operate machinery. DO NOT perform jobs where you need to be alert.  DO NOT drink alcoholic beverages while taking this medicine.     If you get dizzy, sit or lie down at the first signs. Be careful going up and down stairs.  Be extra careful to prevent falls.     Never give this medicine to others.     Keep this medicine out of reach of children.     Do not take or save old medicines. Throw them away when outdated.     Keep all medicines in a cool, dry place. DO NOT keep them in your bathroom medicine cabinet or in a cabinet above the stove.    MEDICATION REFILLS  Please be aware that we cannot refill any prescriptions  through the ER. If you need further treatment from what is provided at your ER visit, please follow up with your primary care doctor or your pain management specialist.    FREESTANDING EMERGENCY DEPARTMENTS OF [facility]  Did you know [facility] has two freestanding ERs located just a few miles away?  [facility] [place] and [facility] [place] have short wait times, easy free parking directly in front of the building and top patient satisfaction scores - and the same Board Certified Emergency Medicine doctors as [facility].              ANIKA SHORE  [id number]  [phone]  [phone]  09/01/2015    Discharge Instructions    As always, you are the most important factor in your recovery.  Please follow these instructions carefully.  If you have problems that we have not discussed, CALL OR VISIT YOUR DOCTOR RIGHT AWAY.     If you can't reach your doctor, return to the emergency department.    I [name] understand the written and discussed instructions.  My questions have been answered.  I acknowledge receipt of these instructions.     Patient or responsible person:         Patient's Signature               Physician or Nurse

## 2015-09-01 NOTE — ED Provider Notes (Signed)
[facility] EMERGENCY DEPARTMENT H&P         CLINICAL SUMMARY          Diagnosis:    .     Final diagnoses:   Muscle spasms of neck                 Disposition:      ED Disposition     Discharge Ann Patterson discharge to home/self care.    Condition at disposition: Stable                       CLINICAL INFORMATION        HPI:      Chief Complaint: Spasms  .    Ann Patterson is a 65 y.o. female with pmhx including HLD, spinal meningitis (1956), polio (1956), neuropathy who presents with acute significantly worsened b/l neck pain that radiates to base of head and mildly radiating to b/l upper extremities a/w photophobia.    Pt has h/o pinched cervical nerve c/w chronic b/l neck pain x2 years. Neurologist Dr. Stacy Gardner Rx's Lyrica 150 mg 3x/day and recently increased dose of flexeril from 5 mg to 10 mg 3x/day. She presents to the ED for acutely and significantly worsened b/l and posterior neck pain, not improved with Rx's. Her pain radiates to base of skull and to b/l upper extremities. Pt has limited ROM of neck d/t pain, also c/o photophobia when not wearing her glasses.    Denies fever, N/V, direct trauma, recent fall, weakness, paresthesias.    History obtained from: Patient      ROS:      Positive and negative ROS elements as per HPI.   All Other Systems Reviewed and Negative: Yes      Physical Exam:      Pulse 91  BP 161/74 mmHg  Resp 18  SpO2 98 %  Temp 97.6 F (36.4 C)    Physical Exam   Nursing note and vitals reviewed.   Constitutional: Pt is oriented to person, place, and time. Pt appears well-developed and well-nourished.  Eyes: Conjunctivae normal . No icterus  ENT: no nasal [place] mmm. B/l paraspinal tenderness of neck. Decreased ROM of neck. NV intact.  Cardiovascular: Normal rate, regular rhythm radial pulse  Pulmonary/Chest: Effort normal al. No respiratory distress.     Musculoskeletal: Normal range of motion. no deformity.   Neurological: Pt is alert and oriented to person, place, and time. GCS  eye subscore is 4. GCS verbal subscore is 5. GCS motor subscore is 6. MAE   Skin: Skin is warm and dry.   Psychiatric: Pt has a normal  affect. Pt behavior is normal.               PAST HISTORY        Primary Care Provider: Mart Piggs, MD        PMH/PSH:    .     Past Medical History   Diagnosis Date   . Polio    . Vertigo    . Hyperlipidemia    . Headache    . Meningitis spinal 1956     spinal and "regular   . Pneumonia 2015   . Low back pain    . 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 Neuropathy      upper c spine issues, + carpal tunnel symptoms bilaterally       She has past surgical history that includes Hysterectomy; Appendectomy; Carpal  tunnel release; Back surgery; Fracture surgery; REPAIR, HAND, NERVE (12/10/2012); REPAIR, LIGAMENT (Left, 03/02/2015); and ORIF, FINGER (Left, 03/02/2015).      Social/Family History:      She reports that she has been smoking Cigarettes.  She has a 15 pack-year smoking history. She does not have any smokeless tobacco history on file. She reports that she drinks alcohol. She reports that she uses illicit drugs (Marijuana) about twice per week.    Family History   Problem Relation Age of Onset   . Breast cancer Neg Hx    . Myocardial Infarction Mother    . Diverticulitis Sister    . Diabetes Maternal Grandmother    . Brain cancer Maternal Grandfather          Listed Medications on Arrival:    .     Previous Medications    ASPIRIN 81 MG TABLET    Take 81 mg by mouth daily.      CLOBETASOL (TEMOVATE) 0.05 % OINTMENT        CYCLOBENZAPRINE (FLEXERIL) 10 MG TABLET    Take 1 tablet (10 mg total) by mouth 3 (three) times daily as needed for Muscle spasms (neck pain).    FISH OIL-OMEGA-3 FATTY ACIDS 1000 MG CAPSULE    Take 1 g by mouth daily.      PREGABALIN (LYRICA) 150 MG CAPSULE    Take 1 capsule (150 mg total) by mouth 3 (three) times daily.    RALOXIFENE (EVISTA) 60 MG TABLET    Take 1 tablet (60 mg total) by mouth daily.      Allergies: She is allergic to gadolinium; iodine; and penicillins.             VISIT INFORMATION                    Medications Given in the ED:    .     ED Medication Orders     Start Ordered     Status Ordering Provider    09/01/15 2047 09/01/15 2047  traMADol (ULTRAM) tablet 100 mg   Every 6 hours PRN     Route: Oral  Ordered Dose: 100 mg     Last MAR action:  Given Phenix Vandermeulen E            Procedures:            Interpretations:      MDM:     Pulse Ox Analysis interpreted by me is 99% on RA nl without need for supplementation     Cardiac Monitor Analysis interpreted by me - N/A    Labs: N/A    Radiologic Interpretation by me: N/A        DDX: neuromuscular spasm, herniated disc, inflammation, neuropathy, pinched nerve    Clinical Course in the ED:       Long term sysmptoms, dw coverage, will not increase sode of lyrica, to fu with Dr Stacy Gardner,           Discharge Prescriptions     Medication Sig Dispense Auth. Provider    traMADol (ULTRAM) 50 MG tablet Take 1-2 tablets (50-100 mg total) by mouth every 6 (six) hours as needed. 20 tablet Carmon Sails, MD                  RESULTS        Lab Results:      Results     ** No results found for the last  24 hours. **              Radiology Results:      No orders to display               Scribe Attestation:      I was acting as a scribe for Carmon Sails, MD on Ann Patterson  Treatment Team: Scribe: Particia Lather   I am the first provider for this patient and I personally performed the services documented. Treatment Team: Scribe: Particia Lather is scribing for me on [facility] V. This note accurately reflects work and decisions made by me.  Carmon Sails, MD           Carmon Sails, MD  09/02/15 [phone]

## 2015-09-04 ENCOUNTER — Telehealth (INDEPENDENT_AMBULATORY_CARE_PROVIDER_SITE_OTHER): Payer: Self-pay

## 2015-09-04 ENCOUNTER — Other Ambulatory Visit (INDEPENDENT_AMBULATORY_CARE_PROVIDER_SITE_OTHER): Payer: Self-pay

## 2015-10-22 ENCOUNTER — Ambulatory Visit (INDEPENDENT_AMBULATORY_CARE_PROVIDER_SITE_OTHER): Payer: Medicare Other | Admitting: Vascular Neurology

## 2015-11-17 ENCOUNTER — Ambulatory Visit (INDEPENDENT_AMBULATORY_CARE_PROVIDER_SITE_OTHER): Payer: Medicare Other | Admitting: Vascular Neurology

## 2015-11-17 ENCOUNTER — Encounter (INDEPENDENT_AMBULATORY_CARE_PROVIDER_SITE_OTHER): Payer: Self-pay | Admitting: Vascular Neurology

## 2015-11-17 VITALS — BP 119/75 | HR 83 | Ht 61.0 in | Wt 92.6 lb

## 2015-11-17 DIAGNOSIS — M542 Cervicalgia: Secondary | ICD-10-CM

## 2015-11-17 DIAGNOSIS — M5412 Radiculopathy, cervical region: Secondary | ICD-10-CM

## 2015-11-17 DIAGNOSIS — G5603 Carpal tunnel syndrome, bilateral upper limbs: Secondary | ICD-10-CM

## 2015-11-17 MED ORDER — PREGABALIN 150 MG PO CAPS
150.0000 mg | ORAL_CAPSULE | Freq: Three times a day (TID) | ORAL | Status: DC
Start: 2015-11-17 — End: 2016-05-03

## 2015-11-17 MED ORDER — CYCLOBENZAPRINE HCL 10 MG PO TABS
10.0000 mg | ORAL_TABLET | Freq: Three times a day (TID) | ORAL | Status: DC | PRN
Start: 2015-11-17 — End: 2016-05-03

## 2015-11-17 NOTE — Progress Notes (Signed)
Subjective:      Patient ID: Ann Patterson is a 65 y.o. female.    HPI    The patient says that she has been doing fairly well.  She has been able to gain about 10 pounds and her weight has stabilized.    She uses Flexeril 10 mg 3 times a day and Lyrica 150 mg 3 times a day and this regimen is working well for her.  She has better range of motion in her neck and her neck is not as tight.  She stopped doing physical therapy because she could not afford the co-pay.  Hearing    Her carpal tunnel symptoms are doing a lot better, with improved tingling in the right hand and arm.    Review of Systems  Constitutional: Negative for fever.   Respiratory: Negative for shortness of breath.    Cardiovascular: Negative for chest pain.   Gastrointestinal: Negative for abdominal pain.   Neurological: Negative for dizziness, weakness and headaches.   Psychiatric/Behavioral: Negative for sleep disturbance.       Objective:   Neurologic Exam  Mental Status: The patient was awake, alert, appeared oriented and was appropriate. Speech was fluent and the patient followed commands well. Attention, concentration, and memory appeared intact. Fund of knowledge appeared appropriate for education level.   Cranial Nerves: Pupils were equally round and reactive to light bilaterally. Extraocular movements were intact without nystagmus. Hearing was intact to conversational speech. Face was symmetric. Tongue appeared midline.   Motor: Good/full strength throughout without clear focality or weakness. Bulk appeared normal for body habitus. No obvious tremor noted.   Sensation: light touch was grossly intact.   Coordination: No obvious dysmetria.   Gait: Normal and steady.      Assessment:     1. Neck pain    2. Cervical radiculopathy    3. Carpal tunnel syndrome on both sides          Plan:     1.  Continue Lyrica 150 mg 3 times a day.  2.  Continue Flexeril 10 mg 3 times a day.  3.  Physical therapy.  If able to.  4.  Monitor carpal tunnel  symptoms.  5.  Follow-up with me in 6 months or sooner if needed.    The patient will return for follow-up as outlined above. If there are any problems with medications (if prescribed) or questions about any available test results, or if there are any new symptoms or changes in current symptoms, the patient is instructed to call the office.     Genavieve Mangiapane B. Stacy Gardner, MD  Board Certified, Neurology  Board Certified, Clinical Neurophysiology  Board Certified, Vascular Neurology  Board Certified, Sleep Medicine

## 2016-05-03 ENCOUNTER — Ambulatory Visit (INDEPENDENT_AMBULATORY_CARE_PROVIDER_SITE_OTHER): Payer: Medicare Other | Admitting: Vascular Neurology

## 2016-05-03 ENCOUNTER — Encounter (INDEPENDENT_AMBULATORY_CARE_PROVIDER_SITE_OTHER): Payer: Self-pay | Admitting: Vascular Neurology

## 2016-05-03 VITALS — BP 96/63 | HR 101 | Ht 61.0 in | Wt 103.0 lb

## 2016-05-03 DIAGNOSIS — M542 Cervicalgia: Secondary | ICD-10-CM

## 2016-05-03 DIAGNOSIS — M5412 Radiculopathy, cervical region: Secondary | ICD-10-CM

## 2016-05-03 DIAGNOSIS — G5603 Carpal tunnel syndrome, bilateral upper limbs: Secondary | ICD-10-CM

## 2016-05-03 MED ORDER — CYCLOBENZAPRINE HCL 10 MG PO TABS
10.0000 mg | ORAL_TABLET | Freq: Three times a day (TID) | ORAL | 5 refills | Status: DC | PRN
Start: 2016-05-03 — End: 2016-12-28

## 2016-05-03 MED ORDER — PREGABALIN 150 MG PO CAPS
150.0000 mg | ORAL_CAPSULE | Freq: Three times a day (TID) | ORAL | 5 refills | Status: DC
Start: 2016-05-03 — End: 2016-10-17

## 2016-05-03 NOTE — Progress Notes (Signed)
Subjective:      Patient ID: Ann Patterson is a 66 y.o. female.    HPI    The patient has been doing well since I last saw her, also gaining some weight.  She is now 103 pounds.  She says her neck is doing okay.  Occasionally she gets tense and feels some swelling.  She continues on Lyrica 150 mg 3 times a day and Flexeril 3 times a day.  She says the latter causes her to have dry mouth.    Her hands are doing well.  She is able to open and close them without difficulty.  She has now completed therapy.    Review of Systems    Constitutional: Negative for fever.   Respiratory: Negative for shortness of breath.    Cardiovascular: Negative for chest pain.   Gastrointestinal: Negative for abdominal pain.   Neurological: Negative for dizziness, weakness, numbness and headaches.   Psychiatric/Behavioral: Negative for sleep disturbance.   All other systems reviewed and are negative except pertinent positives as noted above in HPI.     Objective:   Neurologic Exam  Vitals:    05/03/16 1409   BP: 96/63   Pulse: (!) 101     Mental Status: The patient was awake, alert, appeared oriented and was appropriate. Speech was fluent and the patient followed commands well. Attention, concentration, and memory appeared intact. Fund of knowledge appeared appropriate for education level.   Cranial Nerves: Pupils were equally round and reactive to light bilaterally. Extraocular movements were intact without nystagmus. Hearing was intact to conversational speech. Face was symmetric. Tongue appeared midline.   Motor: Good/full strength throughout without clear focality or weakness. Bulk appeared normal for body habitus. No obvious tremor noted.   Sensation: light touch was grossly intact.   Coordination: Finger to nose testing was intact without dysmetria.   Gait: Normal and steady.      Assessment:     1. Neck pain    2. Cervicalgia    3. Cervical radiculopathy    4. Carpal tunnel syndrome on both sides          Plan:     1.  Continue  Lyrica 150 mg 3 times a day.  2.  Flexeril 3 times a day.  3.  We discussed changing Flexeril to a different muscle relaxant, but agreed to continue with Flexeril for now.  She can suck on hard candies to increase saliva production.  4.  Consider additional therapy is needed.  5.  Follow-up with me in a year or sooner if needed.    The patient will return for follow-up as outlined above. If there are any problems with medications (if prescribed) or questions about any available test results, or if there are any new symptoms or changes in current symptoms, the patient is instructed to call the office.     Ann Skilton B. Stacy Gardner, MD  Board Certified, Neurology  Board Certified, Clinical Neurophysiology  Board Certified, Vascular Neurology  Board Certified, Sleep Medicine

## 2016-10-17 ENCOUNTER — Other Ambulatory Visit (INDEPENDENT_AMBULATORY_CARE_PROVIDER_SITE_OTHER): Payer: Self-pay

## 2016-10-17 DIAGNOSIS — M542 Cervicalgia: Secondary | ICD-10-CM

## 2016-10-17 DIAGNOSIS — M5412 Radiculopathy, cervical region: Secondary | ICD-10-CM

## 2016-10-18 ENCOUNTER — Encounter (INDEPENDENT_AMBULATORY_CARE_PROVIDER_SITE_OTHER): Payer: Self-pay

## 2016-10-18 NOTE — Progress Notes (Signed)
[facility] called in the refill order of Lyrica  150 mg 111 pills 1x dispense d/t to pt throwing her pills in the trash, notified pt

## 2016-10-19 NOTE — Telephone Encounter (Signed)
Completed script called into [facility]

## 2016-10-21 MED ORDER — PREGABALIN 150 MG PO CAPS
150.0000 mg | ORAL_CAPSULE | Freq: Three times a day (TID) | ORAL | 0 refills | Status: DC
Start: 2016-10-21 — End: 2016-11-21

## 2016-10-28 ENCOUNTER — Emergency Department: Payer: Medicare Other

## 2016-10-28 ENCOUNTER — Emergency Department
Admission: EM | Admit: 2016-10-28 | Discharge: 2016-10-28 | Disposition: A | Discharge status: Home / Self Care | Payer: Medicare Other | Attending: Emergency Medicine | Admitting: Emergency Medicine

## 2016-10-28 DIAGNOSIS — S8002XA Contusion of left knee, initial encounter: Secondary | ICD-10-CM | POA: Insufficient documentation

## 2016-10-28 DIAGNOSIS — S80211A Abrasion, right knee, initial encounter: Secondary | ICD-10-CM | POA: Insufficient documentation

## 2016-10-28 DIAGNOSIS — S0990XA Unspecified injury of head, initial encounter: Secondary | ICD-10-CM

## 2016-10-28 DIAGNOSIS — S60512A Abrasion of left hand, initial encounter: Secondary | ICD-10-CM | POA: Insufficient documentation

## 2016-10-28 DIAGNOSIS — Y9301 Activity, walking, marching and hiking: Secondary | ICD-10-CM | POA: Insufficient documentation

## 2016-10-28 DIAGNOSIS — F17210 Nicotine dependence, cigarettes, uncomplicated: Secondary | ICD-10-CM | POA: Insufficient documentation

## 2016-10-28 DIAGNOSIS — S80212A Abrasion, left knee, initial encounter: Secondary | ICD-10-CM | POA: Insufficient documentation

## 2016-10-28 DIAGNOSIS — W010XXA Fall on same level from slipping, tripping and stumbling without subsequent striking against object, initial encounter: Secondary | ICD-10-CM | POA: Insufficient documentation

## 2016-10-28 DIAGNOSIS — T07XXXA Unspecified multiple injuries, initial encounter: Secondary | ICD-10-CM

## 2016-10-28 DIAGNOSIS — Y92008 Other place in unspecified non-institutional (private) residence as the place of occurrence of the external cause: Secondary | ICD-10-CM | POA: Insufficient documentation

## 2016-10-28 DIAGNOSIS — S0083XA Contusion of other part of head, initial encounter: Secondary | ICD-10-CM | POA: Insufficient documentation

## 2016-10-28 DIAGNOSIS — S63502A Unspecified sprain of left wrist, initial encounter: Secondary | ICD-10-CM | POA: Insufficient documentation

## 2016-10-28 MED ORDER — HYDROCODONE-ACETAMINOPHEN 5-325 MG PO TABS
1.0000 | ORAL_TABLET | Freq: Once | ORAL | Status: AC
Start: 2016-10-28 — End: 2016-10-28
  Administered 2016-10-28: 1 via ORAL
  Filled 2016-10-28: qty 1

## 2016-10-28 MED ORDER — TRAMADOL HCL 50 MG PO TABS
50.0000 mg | ORAL_TABLET | Freq: Three times a day (TID) | ORAL | 0 refills | Status: DC | PRN
Start: 2016-10-28 — End: 2017-08-31

## 2016-10-28 NOTE — ED Provider Notes (Signed)
Attending Note:     The patient was seen and examined by the mid-level provider. I agree with the plan as it was presented to me.   - Hayzlee Mcsorley H. Joseph Art, MD    Diagnosis:   1. Wrist sprain, left, initial encounter    2. Multiple abrasions    3. Contusion of left knee, initial encounter    4. Traumatic injury of head, initial encounter      Disposition:   ED Disposition     ED Disposition Condition Date/Time Comment    Discharge  Fri Oct 28, 2016  4:22 PM Ann Patterson discharge to home/self care.    Condition at disposition: Stable        I was acting as a Neurosurgeon for Lenord Fellers, MD on Ann Patterson  Treatment Team: Scribe: Delice Lesch    I am the first provider for this patient and I personally performed the services documented. Treatment Team: Scribe: Delice Lesch is scribing for me on [name]. This note accurately reflects work and decisions made by me.  Lenord Fellers, MD       Lenord Fellers, MD  10/28/16 [phone]

## 2016-10-28 NOTE — ED Triage Notes (Signed)
Refer to quick triage. Pt c/o pain 10/10 with right cheek, R&L hands and knees. Noted with bandage on both knees.

## 2016-10-28 NOTE — Discharge Instructions (Signed)
Dear Ann Patterson:    Thank you for choosing the [facility] Emergency Department, the premier emergency department in the [place] area.  I hope your visit today was EXCELLENT.    Specific instructions for your visit today:    Continue with ibuprofen as needed for pain.  Follow up with Dr. Toney Rakes for re-evaluation of wrist/hand pain.     Wrist Sprain    You have been diagnosed with a sprain of the wrist.    A sprain is an injury to a ligament (a type of connective tissue). It is usually a tear or partial tear. Ligaments are the tough bands that hold the bones in your wrist together. Sprains are often as painful as fractures (broken bones). Sprains are often divided into 3 types. The type depends on how bad the injury is: a first-degree sprain is considered a minor tear. With a third degree sprain, there is often a chip fracture of the bone that the ligament is attached to.    Generally, sprain treatment includes the use of pain medicine and a splint to reduce movement. Treatment also involves Resting, Icing, Compressing and Elevating the injured area. Remember this as "RICE."     Rest: Limit the use of the injured body part.   ICE: By applying ice to the affected area, swelling and pain can be reduced. Place some ice cubes in a re-sealable (Ziploc) bag and add some water. Put a thin washcloth between the bag and the skin. Apply the ice bag to the area for at least 20 minutes. Do this at least 4 times per day. Using the ice for longer times and more frequently is OK. NEVER APPLY ICE DIRECTLY TO THE SKIN.   COMPRESS: Compression means to apply pressure around the injured area such as with a splint, cast or an ACE bandage. Compression decreases swelling and improves comfort. Compression should be tight enough to relieve swelling but not so tight as to decrease circulation. Increasing pain, numbness, tingling, or change in skin color, are all signs of decreased circulation.   Elevate: Elevate the injured  part. For example, you can elevate a leg by putting it on a chair while sitting. You can also prop it up on pillows while lying down.    You have been given a SPLINT for your sprain. This is to lower pain and help keep the injured area from moving.    Use the SPLINT CARE INSTRUCTIONS below. Do the following many times throughout the day:   Check capillary refill (circulation) in the nail beds. Press on the nail bed and then release. It should turn white when you press on it. It should then get pink again in less than 2 seconds after you let go.   Watch to see if the area beyond the splint gets swollen.   The splint may be too tight if the skin of the hand/foot or fingers/toes is very cold, pale or numb to the touch. The wrap holding the splint in place can be loosened. You can come back here or go to the nearest Emergency Department to have it adjusted.    You should use the splint:   Until follow-up with the doctor or referral orthopedic (bone) doctor.    YOU SHOULD SEEK MEDICAL ATTENTION IMMEDIATELY, EITHER HERE OR AT THE NEAREST EMERGENCY DEPARTMENT, IF ANY OF THE FOLLOWING OCCURS:   There is a severe increase in pain in the injured area.   You have new numbness or tingling in  or below the injured area.   You develop a cold, pale hand or foot that seems to have blood supply problems.      Head Injury, NOS    You have been seen for a head injury.    A head injury can happen after something strikes the head or as a result of a fall or other injury. Head injuries can range from mild injuries to more severe injuries. The more severe injuries can result in broken bones or injury to the brain itself. Mild head injuries will show no abnormalities if a CT (CAT) scan of the brain is done.     Although you had an injury to your head, you do not seem to have a serious brain injury.     Head injury symptoms can last from hours to months. The time depends on how bad the injury was. It also depends on whether  you've had a concussion in the past. Some problems with a concussion can include: Sleep, memory and concentration problems. They also include chronic (ongoing) headaches and sensitivity to light. These symptoms can happen soon after the concussion. They can also develop slowly over time. They can last up to a year. When this happens, it is called "post concussion syndrome."    If you develop "post-concussive syndrome," you should follow up with your doctor. Your doctor can care for you or provide a referral to a head-injury specialist.    Treatment includes observation at home and pain medicine like acetaminophen (Tylenol) or ibuprofen (Advil or Motrin). Prescription pain medicine is probably not needed.    You might have a mild headache for a few days.    Over the next 24 hours:   Stay with family or friends who can watch your behavior.   Avoid alcohol or drugs.    YOU SHOULD SEEK MEDICAL ATTENTION IMMEDIATELY, EITHER HERE OR AT THE NEAREST EMERGENCY DEPARTMENT, IF ANY OF THE FOLLOWING OCCURS:   Your headache gets worse.   Your headache pain changes.   You have fever (temperature higher than 100.45F / 38C), neck pain, vision changes, difficulty walking or change of behavior.   You feel numbness, tingling, weakness in your arms or legs.   You faint.   Your vision changes.   You vomit often or cannot keep medicine down.   You are confused or have difficulty waking from sleep.                     Abrasion    You have been diagnosed with an abrasion. This is a scrape of the outer skin layers.    Take off old dressings every day. Then put on a clean, dry dressing. If the dressing sticks to the wound, moisten it with water. This way, it can come off more easily.    Keep the wound clean and dry for the next 24 hours. You can wash the wound gently with soap and water. Then put on a dry bandage if needed, to protect it.    Put a thin layer of antibiotic ointment on the wound 2-3 times a day. This can  be Polysporin / triple antibiotic. This can help prevent infection. It may help keep scarring to a minimum.    YOU SHOULD SEEK MEDICAL ATTENTION IMMEDIATELY, EITHER HERE OR AT THE NEAREST EMERGENCY DEPARTMENT, IF ANY OF THE FOLLOWING OCCURS:   Unusual redness or swelling.   There are red streaks going up the arm or leg.  The wound smells bad or has a lot of drainage.   Fever (temperature higher than 100.42F / 38C), chills, more pain and / or swelling.        Contusion    You have been diagnosed with a contusion.    A contusion is a bruise. A contusion occurs when something strikes or hits the body. This breaks small blood vessels called capillaries. When the capillaries break, blood leaks out. This makes the skin look red, purple, blue, or black. The injured area may hurt for a few days. If you take a blood thinner like warfarin (Coumadin) the bruising may be worse.    Apply ice to the bruise. Avoid using the injured body part.    Apply ice to help with pain and swelling. Put some ice cubes in a re-sealable plastic bag (like Ziploc). Add some water. Seal the bag. Put a thin washcloth between the bag and the skin. Apply the ice bag for at least 20 minutes. Do this at least 4 times per day. It's okay to apply ice longer or more often. NEVER APPLY ICE DIRECTLY TO THE SKIN. Always keep a washcloth between the ice pack and your body.    YOU SHOULD SEEK MEDICAL ATTENTION IMMEDIATELY, EITHER HERE OR AT THE NEAREST EMERGENCY DEPARTMENT, IF ANY OF THE FOLLOWING OCCURS:   Your pain or swelling gets much worse.   You develop new numbness or tingling in or below the affected area.   Your foot or hand looks cold or pale. This could mean there is a problem with circulation (blood supply).                             If you do not continue to improve or your condition worsens, please contact your doctor or return immediately to the Emergency Department.    Sincerely,    Lenord Fellers, MD - Attending Emergency  Physician  Doristine Mango, PA-C - Physician Assistant  [facility] Emergency Department    ONSITE PHARMACY  Our full service onsite pharmacy is located in the ER waiting room.  Open 7 days a week from 9 am to 11 pm.  We accept all major insurances and prices are competitive with major retailers.  Ask your provider to print your prescriptions down to the pharmacy to speed you on your way home.    OBTAINING A PRIMARY CARE APPOINTMENT    Primary care physicians (PCPs, also known as primary care doctors) are either internists or family medicine doctors. Both types of PCPs focus on health promotion, disease prevention, patient education and counseling, and treatment of acute and chronic medical conditions.    Call for an appointment with a primary care doctor.  Ask to see who is taking new patients.     [facility]  telephone:  [phone]  [email or web address]    DOCTOR REFERRALS  Call [phone] (available 24 hours a day, 7 days a week) if you need any further referrals and we can help you find a primary care doctor or specialist.  Also, available online at:  [email or web address]    YOUR CONTACT INFORMATION  Before leaving please check with registration to make sure we have an up-to-date contact number.  You can call registration at [phone] to update your information.  For questions about your hospital bill, please call [phone].  For questions about your Emergency Dept Physician bill please call [phone].  FREE HEALTH SERVICES  If you need help with health or social services, please call 2-1-1 for a free referral to resources in your area.  2-1-1 is a free service connecting people with information on health insurance, free clinics, pregnancy, mental health, dental care, food assistance, housing, and substance abuse counseling.  Also, available online at:  http://www.[facility].org    MEDICAL RECORDS AND TESTS  Certain laboratory test results do not come  back the same day, for example urine cultures.   We will contact you if other important findings are noted.  Radiology films are often reviewed again to ensure accuracy.  If there is any discrepancy, we will notify you.      Please call [phone] to pick up a complimentary CD of any radiology studies performed.  If you or your doctor would like to request a copy of your medical records, please call [phone].      ORTHOPEDIC INJURY   Please know that significant injuries can exist even when an initial x-ray is read as normal or negative.  This can occur because some fractures (broken bones) are not initially visible on x-rays.  For this reason, close outpatient follow-up with your primary care doctor or bone specialist (orthopedist) is required.    MEDICATIONS AND FOLLOWUP  Please be aware that some prescription medications can cause drowsiness.  Use caution when driving or operating machinery.    The examination and treatment you have received in our Emergency Department is provided on an emergency basis, and is not intended to be a substitute for your primary care physician.  It is important that your doctor checks you again and that you report any new or remaining problems at that time.      24 HOUR PHARMACIES  The nearest 24 hour pharmacy is:    [facility]  [address]  [place], [place] [address]  [phone]      ASSISTANCE WITH INSURANCE    Affordable Care Act  [facility])  Call to start or finish an application, compare plans, enroll or ask a question.  [phone]  TTY: [phone]  Web:  Healthcare.gov    Help Enrolling in [facility]  [employer] [place]  517-178-9323 (TOLL-FREE)  [phone] (TTY)  Web:  Http://www.coverva.org    Local Help Enrolling in the [facility]  [place] [facility]  [phone] (MAIN)  Email:  health-help@nvfs .org  Web:  BlackjackMyths.is  Address:  [address], [address] [place], [place] [address]    SEDATING MEDICATIONS  Sedating  medications include strong pain medications (e.g. narcotics), muscle relaxers, benzodiazepines (used for anxiety and as muscle relaxers), Benadryl/diphenhydramine and other antihistamines for allergic reactions/itching, and other medications.  If you are unsure if you have received a sedating medication, please ask your physician or nurse.  If you received a sedating medication: DO NOT drive a car. DO NOT operate machinery. DO NOT perform jobs where you need to be alert.  DO NOT drink alcoholic beverages while taking this medicine.     If you get dizzy, sit or lie down at the first signs. Be careful going up and down stairs.  Be extra careful to prevent falls.     Never give this medicine to others.     Keep this medicine out of reach of children.     Do not take or save old medicines. Throw them away when outdated.     Keep all medicines in a cool, dry place. DO NOT keep them in  your bathroom medicine cabinet or in a cabinet above the stove.    MEDICATION REFILLS  Please be aware that we cannot refill any prescriptions through the ER. If you need further treatment from what is provided at your ER visit, please follow up with your primary care doctor or your pain management specialist.    [place]  Did you know [facility] has two freestanding ERs located just a few miles away?  [facility] [place] have short wait times, easy free parking directly in front of the building and top patient satisfaction scores - and the same Board Certified Emergency Medicine doctors as [facility].

## 2016-10-28 NOTE — ED Provider Notes (Signed)
[facility] EMERGENCY DEPARTMENT APP H&P      Visit date: 10/28/2016      CLINICAL SUMMARY          Diagnosis:    .     Final diagnoses:   Wrist sprain, left, initial encounter   Multiple abrasions   Contusion of left knee, initial encounter   Traumatic injury of head, initial encounter         MDM Notes:      Pt presents with c/o L wrist, hand, and knee pain s/p fall.  L wrist/hand xrays negative for fx. +anatomic snuffbox tenderness.  Applied thumb spica and pt will f/u with Dr. Toney Rakes for further evaluation.  L knee xray neg for fracture. Applied ace bandage. Ambulatory with cane.  Will continue with ibuprofen as needed for pain.         Disposition:          ED Disposition     ED Disposition Condition Date/Time Comment    Discharge  Fri Oct 28, 2016  4:22 PM [name] discharge to home/self care.    Condition at disposition: Stable                     CLINICAL INFORMATION        HPI:      Chief Complaint: Fall  .        Context: s/p fall  Location: L wrist, knee, hand; R cheek  Duration: fall at 10 PM last night  Quality: soreness  Timing:constant  Maximum Severity: 10/10   Modifying Factors: worsens with movement; no alleviating factors.    Ann Patterson is a 66 y.o. female with PMH of hyperlipidemia, LBP, meningitis, and polio who presents with c/o L wrist/hand/knee and R cheek pain s/p fall. Pt reports she tripped while walking out of her garage last night.   She his her R cheek on the ground.  No LOC.  Reports difficulty walking d/t knee pain.    Denies extremity paresthesias/weakness, CP, SOB, abd pain.  No back/neck pain.  No HA, vision changes, n/v.     History obtained from: patient, review of prior chart      Nursing (triage) note reviewed for the following pertinent information:  Pt reports tripping on driveway and fell onto hands last night, reports pain in left hand, pt reports hitting cheek, pt did not pass out, pt in wheelchair at this time           ROS:      Positive and negative ROS  elements as per HPI.  All other systems reviewed and negative.      Physical Exam:      Pulse 94  BP 155/82  Resp 16  SpO2 97 %  Temp 98.1 F (36.7 C)    Physical Exam   Constitutional: She is oriented to person, place, and time. She appears well-developed and well-nourished.   HENT:   Head: Normocephalic.   Mouth/Throat: Oropharynx is clear and moist.   Eyes: Conjunctivae and EOM are normal. Pupils are equal, round, and reactive to light.   Neck: Normal range of motion. Neck supple.   Cardiovascular: Normal rate, regular rhythm, normal heart sounds and intact distal pulses.    Pulmonary/Chest: Effort normal and breath sounds normal.   Abdominal: Soft. Bowel sounds are normal. There is no tenderness.   Musculoskeletal:        Right knee: She exhibits swelling. She exhibits normal range  of motion, no deformity and no bony tenderness.        Left knee: She exhibits decreased range of motion, swelling and bony tenderness. She exhibits no effusion and no deformity. Tenderness found.        Thoracic back: She exhibits no tenderness and no bony tenderness.        Lumbar back: She exhibits no tenderness and no bony tenderness.   L hand/wrist - +swelling noted over dorsal hand; +abrasion over palmar surface; TTP over 2nd-5th metacarpal, anatomic snuffbox, distal radius/ulna; decreased ROM wrist; full flexion DIPJ/PIPJ/MCPJ against resistance digits 2-5; full opposition of thumb with full flexion IPJ/MCPJ against resistance; cap refill less than 2sec; radial pulse 2+   Neurological: She is alert and oriented to person, place, and time. No cranial nerve deficit. GCS eye subscore is 4. GCS verbal subscore is 5. GCS motor subscore is 6.   Skin: Skin is warm and dry.   L maxilla - +ecchymosis noted; no swelling; skin intact.  Bilateral knees - +abrasion noted over patella  L hand - +abrasion noted over thenar/hypothenar spaces.   Psychiatric: She has a normal mood and affect. Her behavior is normal.   Vitals reviewed.               PAST HISTORY        Primary Care Provider: Mart Piggs, MD        PMH/PSH:    .     Past Medical History:   Diagnosis Date   . Headache    . Hyperlipidemia    . Low back pain    . Meningitis spinal 1956    spinal and "regular   . Neuropathy     upper c spine issues, + carpal tunnel symptoms bilaterally   . Pneumonia 2015   . Polio    . 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 Vertigo        She has a past surgical history that includes Hysterectomy; Appendectomy; Carpal tunnel release; Back surgery; Fracture surgery; REPAIR, HAND, NERVE (12/10/2012); REPAIR, LIGAMENT (Left, 03/02/2015); and ORIF, FINGER (Left, 03/02/2015).      Social/Family History:      She reports that she has been smoking Cigarettes.  She has a 15.00 pack-year smoking history. She has never used smokeless tobacco. She reports that she drinks alcohol. She reports that she uses drugs, including Marijuana, about 2 times per week.    Family History   Problem Relation Age of Onset   . Myocardial Infarction Mother    . Diverticulitis Sister    . Diabetes Maternal Grandmother    . Brain cancer Maternal Grandfather    . Breast cancer Neg Hx          Listed Medications on Arrival:    .     Home Medications             aspirin 81 MG tablet     Take 81 mg by mouth daily.       clobetasol (TEMOVATE) 0.05 % ointment          cyclobenzaprine (FLEXERIL) 10 MG tablet     Take 1 tablet (10 mg total) by mouth 3 (three) times daily as needed for Muscle spasms (neck pain).     fish oil-omega-3 fatty acids 1000 MG capsule     Take 1 g by mouth daily.       pregabalin (LYRICA) 150 MG capsule     Take 1 capsule (150 mg total)  by mouth 3 (three) times daily.     raloxifene (EVISTA) 60 MG tablet     Take 1 tablet (60 mg total) by mouth daily.                    Allergies: She is allergic to gadolinium; iodine; and penicillins.            VISIT INFORMATION        Clinical Course in the ED:           Discussed patient's care and plan with attending physician.    Counseling: Spoke with  the patient/family and discussed today's findings, in addition to providing specific details for the plan of care. Questions are answered and the patient agrees with the plan. Specific return precautions discussed.         Medications Given in the ED:    .     ED Medication Orders     Start Ordered     Status Ordering Provider    10/28/16 1347 10/28/16 1346  HYDROcodone-acetaminophen (NORCO) 5-325 MG per tablet 1 tablet  Once     Route: Oral  Ordered Dose: 1 tablet     Last MAR action:  Given Ryun Velez D            Procedures:      Procedures      Interpretations:      O2 sat-           saturation: 97 %; Oxygen use: room air; Interpretation: Normal    Radiology -     Reviewed CT Head, L hand/wrist/knee xrays.  Splint -           Thumb spica, placed by the nurse.  After placement the extremity was neurovascularly intact.  DDx-              Initial differential diagnosis included: fracture, sprain, contusion, dislocation and NV injury; cerebral hemorrhage, skull fracture, concussion     Head CT/trauma - GCS: 15  Dangerous mechanism               RESULTS        Lab Results:      Lab Results    None           Radiology Results:      CT Head without Contrast   Final Result       1.  Negative head CT for acute traumatic injury.                Einar Pheasant, MD    10/28/2016 3:09 PM      Hand Left PA Lateral and Oblique   Final Result    No acute fracture malalignment of the left hand.      Elizebeth Koller, MD    10/28/2016 2:56 PM      Knee 4+ View Left   Final Result    No acute fracture or malalignment of the left knee.      Elizebeth Koller, MD    10/28/2016 2:55 PM      Wrist Left PA Lateral and Oblique   Final Result    No acute fracture or malalignment of the left wrist.      Elizebeth Koller, MD    10/28/2016 2:54 PM                  Scribe Attestation:      No scribe  involved in the care of this patient                                                                                                                                                                                                          Anice Paganini, [place]  10/29/16 1610

## 2016-11-21 ENCOUNTER — Other Ambulatory Visit (INDEPENDENT_AMBULATORY_CARE_PROVIDER_SITE_OTHER): Payer: Self-pay

## 2016-11-21 DIAGNOSIS — M542 Cervicalgia: Secondary | ICD-10-CM

## 2016-11-21 DIAGNOSIS — M5412 Radiculopathy, cervical region: Secondary | ICD-10-CM

## 2016-11-22 MED ORDER — PREGABALIN 150 MG PO CAPS
150.0000 mg | ORAL_CAPSULE | Freq: Three times a day (TID) | ORAL | 0 refills | Status: DC
Start: 2016-11-22 — End: 2016-12-28

## 2016-12-28 ENCOUNTER — Encounter (INDEPENDENT_AMBULATORY_CARE_PROVIDER_SITE_OTHER): Payer: Self-pay | Admitting: Family

## 2016-12-28 ENCOUNTER — Ambulatory Visit (INDEPENDENT_AMBULATORY_CARE_PROVIDER_SITE_OTHER): Payer: Medicare Other | Admitting: Family

## 2016-12-28 DIAGNOSIS — M542 Cervicalgia: Secondary | ICD-10-CM

## 2016-12-28 DIAGNOSIS — M5412 Radiculopathy, cervical region: Secondary | ICD-10-CM

## 2016-12-28 MED ORDER — PREGABALIN 150 MG PO CAPS
150.0000 mg | ORAL_CAPSULE | Freq: Three times a day (TID) | ORAL | 0 refills | Status: DC
Start: 2016-12-28 — End: 2017-01-26

## 2016-12-28 MED ORDER — CYCLOBENZAPRINE HCL 10 MG PO TABS
10.0000 mg | ORAL_TABLET | Freq: Three times a day (TID) | ORAL | 5 refills | Status: DC | PRN
Start: 2016-12-28 — End: 2017-01-23

## 2016-12-28 NOTE — Progress Notes (Signed)
History of Present Illness   Patient presents to clinic for ongoing management of neck pain. The patient is a 66 year old presenting by herself. Past medical history cervical radiculopathy, carpal tunnel syndrome, headache. Overall patient reports difficulty turning neck. Denies any pain. Tolerating Lyrica and Flexeril.       NO Procedures    I personally reviewed the Imaging/studies on this patient.   ROS Negative except as noted in HPI or BOLD:     Weight loss gain  Heartburn Ulcer Pain joint arm leg hand foot    Fever Tic bite Swallowing problems Left Right Weak arm leg hand foot all over   Night sweats Hearing problems tinnitus Left Right numbness and tingling arm leg hand foot   Rash Arm leg face Weakness Back Pain Neck Pain   Chest pain SOB Asthma   Stroke like spell Fatigue Daytime sleepiness Headache Facial Pain LOC Altered consciousness   Seizure like spell   Abdominal pain Diplopia Dizziness Sleep problems OSA EDS   Change in Bowels Eye pain Vision Change Anxiety Stress Depression   Hematuria Incontinence  Hoarseness Speech change Word finding difficulty   Nausea Vomit Diarrhea  Poor balance unsteady Falls close call Memory loss Cognitive issues   The following portions of the patient's history were reviewed and updated as appropriate:   Allergies Current meds Family history Social history PMH surgery history Problem List       EXAM:     Visit Vitals  BP 94/64 (BP Site: Left arm, Patient Position: Sitting, Cuff Size: Medium)   Pulse 98   Ht 1.562 m (5' 1.5")   Wt 45.8 kg (101 lb)   BMI 18.77 kg/m     General appearance: Thin, elderly right handed female in NAD  HEENT: NC/AT. Facial symmetry. Anicteric sclera. Cornea is clear. Positive corneal reflex. PERRLA, pupils equally round, reactive to light, and accommodate. Ear lobes are bean shaped, parallel, and symmetrical. Skin is same in color as in the complexion. No lesions noted on inspection. There is no pain or tenderness on the palpation of the auricles  and mastoid process. No discharges or lesions noted at the ear canal. Nose in the midline. No Discharges. Tongue midline and able to move the tongue freely and with strength. Uvula positioned in the mid line.   Neck: supple  Cardiovascular: S1 and S2 heard. No murmurs or abnormal heart sounds heard. Heart rate regular. Heart rhythm regular. No edema.   Pulmonary: Unlabored breathing. Breath sounds CTA.  Abdomen: Nondistended, nontender, BS x 4.  Mental Status: The patient was awake, alert and oriented X4.  Normal affect.  Recent and remote memory appeared to be normal.   Attention span and concentration appear normal.  Fluent without aphasia.   Cranial nerves: Pupils are equal, round and reactive to light.  Visual fields full.    EOM intact.  No ptosis.  No diplopia.  No nystagmus.  Facial sensation intact.   Face symmetric.  No dysarthria.  Hearing grossly intact.  Shoulder shrug symmetric.  Tongue protrudes midline.  Uvula is midline.  Motor: Muscle tone normal. No atrophy.  No fasiculations. No pronator drift.  Strength  R / L    R / L  Deltoid  5 / 5  Hip Flexion 5 / 5  Triceps  5 / 5   Hip extension 5 / 5  Biceps  5 / 5   Knee flexion 5 / 5  Wrist ext 5 / 5  Knee ext  5 / 5  Wrist flexion 5 / 5  Dorsiflexion 5 / 5  FF   5 / 5  Plantar flexion 5 / 5  Sensory:   Light touch intact.  Temperature intact.  Proprioception intact.  Reflexes:  R / L     R / L  Biceps  2 / 2  Knees  2 / 2  Triceps 2 / 2  Ankles  2 / 2  Brachioradialis2 / 2    Coordination: FTN and HKS intact, no truncal ataxia. RAMs intact. No tremors  Gait: Stable. Good stride length, good initiation.  Intact to tandem, heel, toe walk. Negative Romberg.        Assessment:  1. Neck pain       Plan:  1. Prescribed Lyrica 150mg  TID  2. Prescribed Flexeril 10mg  TID  3. Return to clinic in 3 months  Medication Management  Expressive supportive care given        The patient is to contact me via MyChart if there is any question or new development or medication  side effects and to check on testing results as they become available.       Dr. Berna Bue was available in a supervisory capacity.    Christie Beckers, FNP

## 2017-01-23 ENCOUNTER — Encounter (INDEPENDENT_AMBULATORY_CARE_PROVIDER_SITE_OTHER): Payer: Self-pay

## 2017-01-23 ENCOUNTER — Other Ambulatory Visit (INDEPENDENT_AMBULATORY_CARE_PROVIDER_SITE_OTHER): Payer: Self-pay

## 2017-01-23 DIAGNOSIS — M5412 Radiculopathy, cervical region: Secondary | ICD-10-CM

## 2017-01-23 DIAGNOSIS — M542 Cervicalgia: Secondary | ICD-10-CM

## 2017-01-24 MED ORDER — CYCLOBENZAPRINE HCL 10 MG PO TABS
10.0000 mg | ORAL_TABLET | Freq: Three times a day (TID) | ORAL | 5 refills | Status: DC | PRN
Start: 2017-01-24 — End: 2017-08-31

## 2017-01-26 ENCOUNTER — Encounter (INDEPENDENT_AMBULATORY_CARE_PROVIDER_SITE_OTHER): Payer: Self-pay

## 2017-01-26 ENCOUNTER — Other Ambulatory Visit (INDEPENDENT_AMBULATORY_CARE_PROVIDER_SITE_OTHER): Payer: Self-pay

## 2017-01-26 DIAGNOSIS — M542 Cervicalgia: Secondary | ICD-10-CM

## 2017-01-26 DIAGNOSIS — M5412 Radiculopathy, cervical region: Secondary | ICD-10-CM

## 2017-01-26 NOTE — Progress Notes (Signed)
Received a call from [facility] a pharmacist stated the pt is calling the store yelling and demanding the medication Lyrica informed the pharmacy of the office policy regarding

## 2017-01-27 MED ORDER — PREGABALIN 150 MG PO CAPS
150.0000 mg | ORAL_CAPSULE | Freq: Three times a day (TID) | ORAL | 0 refills | Status: DC
Start: 2017-01-27 — End: 2017-02-24

## 2017-02-07 ENCOUNTER — Encounter (INDEPENDENT_AMBULATORY_CARE_PROVIDER_SITE_OTHER): Payer: Self-pay

## 2017-02-07 NOTE — Progress Notes (Signed)
[facility] to [facility] 02/07/17

## 2017-02-24 ENCOUNTER — Other Ambulatory Visit (INDEPENDENT_AMBULATORY_CARE_PROVIDER_SITE_OTHER): Payer: Self-pay

## 2017-02-24 DIAGNOSIS — M5412 Radiculopathy, cervical region: Secondary | ICD-10-CM

## 2017-02-24 DIAGNOSIS — M542 Cervicalgia: Secondary | ICD-10-CM

## 2017-02-24 MED ORDER — PREGABALIN 150 MG PO CAPS
150.0000 mg | ORAL_CAPSULE | Freq: Three times a day (TID) | ORAL | 0 refills | Status: DC
Start: 2017-02-24 — End: 2017-03-29

## 2017-02-27 ENCOUNTER — Other Ambulatory Visit (INDEPENDENT_AMBULATORY_CARE_PROVIDER_SITE_OTHER): Payer: Self-pay

## 2017-03-29 ENCOUNTER — Encounter (INDEPENDENT_AMBULATORY_CARE_PROVIDER_SITE_OTHER): Payer: Self-pay | Admitting: Vascular Neurology

## 2017-03-29 ENCOUNTER — Ambulatory Visit (INDEPENDENT_AMBULATORY_CARE_PROVIDER_SITE_OTHER): Payer: Medicare Other | Admitting: Vascular Neurology

## 2017-03-29 VITALS — BP 110/72 | HR 85 | Ht 61.5 in | Wt 101.8 lb

## 2017-03-29 DIAGNOSIS — M542 Cervicalgia: Secondary | ICD-10-CM

## 2017-03-29 DIAGNOSIS — M5412 Radiculopathy, cervical region: Secondary | ICD-10-CM

## 2017-03-29 DIAGNOSIS — G5603 Carpal tunnel syndrome, bilateral upper limbs: Secondary | ICD-10-CM

## 2017-03-29 DIAGNOSIS — Z79899 Other long term (current) drug therapy: Secondary | ICD-10-CM

## 2017-03-29 MED ORDER — PREGABALIN 150 MG PO CAPS
150.0000 mg | ORAL_CAPSULE | Freq: Three times a day (TID) | ORAL | 5 refills | Status: DC
Start: 2017-03-29 — End: 2017-08-31

## 2017-03-29 NOTE — Progress Notes (Addendum)
Subjective:      Patient ID: Ann Patterson is a 66 y.o. female.    HPI  The patient was last seen in January.  She has been doing well and says she feels "excellent".  She has new primary doctor, and was having some bruising therefore discontinued aspirin and has cut back on Flexeril.  She will uses this once a day now.  She has not been feeling any tingling in her arms.  She has a little bit of tightness in her neck on the right side, but feels like her range of motion is better in the right arm, and the left arm remains okay.  She says her carpal tunnel syndrome symptoms are better as well.    She continues on Lyrica 150 mg 3 times a day.  She has not been able to afford any physical therapy.      Review of Systems    Constitutional: Negative for fever.   Respiratory: Negative for shortness of breath.    Cardiovascular: Negative for chest pain.   Gastrointestinal: Negative for abdominal pain.   Neurological: Negative for dizziness, weakness, numbness and headaches.   Psychiatric/Behavioral: Negative for sleep disturbance.   All other systems reviewed and are negative except pertinent positives as noted above in HPI.     Objective:   Neurologic Exam  Vitals:    03/29/17 1524   BP: 110/72   Pulse: 85     Mental Status: The patient was awake, alert, appeared oriented and was appropriate. Speech was fluent and the patient followed commands well. Attention, concentration, and memory appeared intact. Fund of knowledge appeared appropriate for education level.   Cranial Nerves: Pupils were equally round and reactive to light bilaterally. Extraocular movements were intact without nystagmus. Hearing was intact to conversational speech. Face was symmetric. Tongue appeared midline.   Motor: Good/full strength throughout without clear focality or weakness. Bulk appeared normal for body habitus. No obvious tremor noted.   Sensation: light touch was grossly intact.   Coordination: No dysmetria.   Gait: Normal and steady.     Assessment:     1. Neck pain    2. Cervical radiculopathy    3. Carpal tunnel syndrome on both sides    4. Medication management          Plan:     1.  Continue Lyrica.  2.  Physical therapy if able to.  3.  Flexeril when necessary.    4.  Follow-up in 6 months.    The patient will return for follow-up as outlined above. If there are any problems with medications (if prescribed) or questions about any available test results, or if there are any new symptoms or changes in current symptoms, the patient is instructed to call the office.       Natassia Guthridge B. Stacy Gardner, MD  Board Certified, Neurology  Board Certified, Clinical Neurophysiology  Board Certified, Vascular Neurology  Board Certified, Sleep Medicine

## 2017-08-31 ENCOUNTER — Encounter (INDEPENDENT_AMBULATORY_CARE_PROVIDER_SITE_OTHER): Payer: Self-pay | Admitting: Vascular Neurology

## 2017-08-31 ENCOUNTER — Ambulatory Visit (INDEPENDENT_AMBULATORY_CARE_PROVIDER_SITE_OTHER): Payer: Medicare Other | Admitting: Vascular Neurology

## 2017-08-31 VITALS — BP 127/77 | HR 72 | Ht 61.5 in | Wt 91.0 lb

## 2017-08-31 DIAGNOSIS — M5412 Radiculopathy, cervical region: Secondary | ICD-10-CM

## 2017-08-31 DIAGNOSIS — G5603 Carpal tunnel syndrome, bilateral upper limbs: Secondary | ICD-10-CM

## 2017-08-31 DIAGNOSIS — Z79899 Other long term (current) drug therapy: Secondary | ICD-10-CM

## 2017-08-31 DIAGNOSIS — M542 Cervicalgia: Secondary | ICD-10-CM

## 2017-08-31 MED ORDER — PREGABALIN 25 MG PO CAPS
25.0000 mg | ORAL_CAPSULE | Freq: Three times a day (TID) | ORAL | 5 refills | Status: DC
Start: 2017-08-31 — End: 2018-01-29

## 2017-08-31 MED ORDER — PREGABALIN 150 MG PO CAPS
150.0000 mg | ORAL_CAPSULE | Freq: Three times a day (TID) | ORAL | 5 refills | Status: DC
Start: 2017-08-31 — End: 2017-12-27

## 2017-08-31 NOTE — Progress Notes (Signed)
Subjective:      Patient ID: Ann Patterson    HPI    The patient says that she has been dealing with some stressful issues at her home, including pipe issues and has lost 10 pounds.  Otherwise she is still taking Lyrica which helps with her neck although she does occasionally have some throbbing pain.  Her primary care doctor took her off of aspirin and Flexeril because of bruising in her arms and bleeding.  The neck pain may be slightly worse since she went off the Flexeril.    She says her carpal tunnel syndrome is fine and she is able to carry gallons of milk if needed.  She denies any numbness and tingling in her hands although may have this rarely.  She says it usually goes away with stretching.  She denies any other new complaints.    Review of Systems    Constitutional: Negative for fever.   Respiratory: Negative for shortness of breath.    Cardiovascular: Negative for chest pain.   Gastrointestinal: Negative for abdominal pain.   Neurological: Negative for dizziness, weakness, numbness and headaches.   Psychiatric/Behavioral: Negative for sleep disturbance.   All other systems reviewed and are negative except pertinent positives as noted above in HPI.         Objective:      Vitals:    08/31/17 1331   BP: 127/77   Pulse: 72       NEUROLOGICAL EXAM    Mental Status: The patient was awake, alert, appeared oriented and was appropriate. Speech was fluent and the patient followed commands well. Attention, concentration, and memory appeared intact. Fund of knowledge appeared appropriate for education level.   Cranial Nerves: Pupils were equally round and reactive to light bilaterally. Extraocular movements were intact without nystagmus. Hearing was intact to conversational speech. Face was symmetric. Tongue appeared midline.   Motor: Good/full strength throughout without clear focality or weakness. Bulk appeared normal for body habitus. No obvious tremor noted.   Sensation: light touch was grossly intact.    Coordination: Finger to nose testing was intact without dysmetria.   Gait: Normal and steady.        Assessment:       1. Neck pain    2. Cervical radiculopathy    3. Carpal tunnel syndrome on both sides    4. Medication management          Plan:      1.  May increase Lyrica to 175 mg 3 times a day.  2.  Off of aspirin and Flexeril now.  3.  Physical therapy is not financially feasible for her at this time.  4.  Follow-up in 6 months.      The patient will return for follow-up as outlined above. If there are any problems with medications (if prescribed) or questions about any available test results, or if there are any new symptoms or changes in current symptoms, the patient is instructed to call the office.       Dama Hedgepeth B. Stacy Gardner, MD  Board Certified, Neurology  Board Certified, Clinical Neurophysiology  Board Certified, Vascular Neurology  Board Certified, Sleep Medicine

## 2017-12-25 ENCOUNTER — Emergency Department
Admission: EM | Admit: 2017-12-25 | Discharge: 2017-12-26 | Disposition: A | Discharge status: Home / Self Care | Payer: Medicare Other | Attending: Emergency Medicine | Admitting: Emergency Medicine

## 2017-12-25 DIAGNOSIS — F17210 Nicotine dependence, cigarettes, uncomplicated: Secondary | ICD-10-CM | POA: Insufficient documentation

## 2017-12-25 DIAGNOSIS — L239 Allergic contact dermatitis, unspecified cause: Secondary | ICD-10-CM | POA: Insufficient documentation

## 2017-12-25 DIAGNOSIS — E785 Hyperlipidemia, unspecified: Secondary | ICD-10-CM | POA: Insufficient documentation

## 2017-12-26 MED ORDER — BETAMETHASONE DIPROPIONATE 0.05 % EX CREA
TOPICAL_CREAM | Freq: Two times a day (BID) | CUTANEOUS | 0 refills | Status: DC
Start: 2017-12-26 — End: 2021-08-26

## 2017-12-26 MED ORDER — HYDROXYZINE PAMOATE 25 MG PO CAPS
25.00 mg | ORAL_CAPSULE | Freq: Three times a day (TID) | ORAL | 0 refills | Status: DC | PRN
Start: 2017-12-26 — End: 2018-04-05

## 2017-12-26 MED ORDER — PREDNISONE 20 MG PO TABS
40.00 mg | ORAL_TABLET | Freq: Every day | ORAL | 0 refills | Status: AC
Start: 2017-12-26 — End: 2017-12-31

## 2017-12-26 MED ORDER — PREDNISONE 20 MG PO TABS
40.0000 mg | ORAL_TABLET | Freq: Once | ORAL | Status: AC
Start: 2017-12-26 — End: 2017-12-26
  Administered 2017-12-26: 40 mg via ORAL
  Filled 2017-12-26: qty 2

## 2017-12-26 MED ORDER — BETAMETHASONE DIPROPIONATE 0.05 % EX CREA
TOPICAL_CREAM | Freq: Two times a day (BID) | CUTANEOUS | Status: DC
Start: 2017-12-26 — End: 2017-12-26
  Filled 2017-12-26: qty 15

## 2017-12-26 MED ORDER — HYDROXYZINE PAMOATE 25 MG PO CAPS
25.0000 mg | ORAL_CAPSULE | Freq: Four times a day (QID) | ORAL | Status: DC | PRN
Start: 2017-12-26 — End: 2017-12-26
  Administered 2017-12-26: 25 mg via ORAL
  Filled 2017-12-26: qty 1

## 2017-12-26 NOTE — Discharge Instructions (Signed)
Dear {MR/MS/MRS:25266} Ann Patterson:    Thank you for choosing the [facility] Emergency Department, the premier emergency department in the [place] area.  I hope your visit today was EXCELLENT.    Specific instructions for your visit today:    ***    If you do not continue to improve or your condition worsens, please contact your doctor or return immediately to the Emergency Department.    Sincerely,  Shara Blazing, MD  Attending Emergency Physician  [facility] Emergency Department    ONSITE PHARMACY  Our full service onsite pharmacy is located in the ER waiting room.  Open 7 days a week from 9 am to 9 pm.  We accept all major insurances and prices are competitive with major retailers.  Ask your provider to print your prescriptions down to the pharmacy to speed you on your way home.    OBTAINING A PRIMARY CARE APPOINTMENT    Primary care physicians (PCPs, also known as primary care doctors) are either internists or family medicine doctors. Both types of PCPs focus on health promotion, disease prevention, patient education and counseling, and treatment of acute and chronic medical conditions.    Call for an appointment with a primary care doctor.  Ask to see who is taking new patients.     [facility]  telephone:  [phone]  [email or web address]    DOCTOR REFERRALS  Call [phone] (available 24 hours a day, 7 days a week) if you need any further referrals and we can help you find a primary care doctor or specialist.  Also, available online at:  [email or web address]    YOUR CONTACT INFORMATION  Before leaving please check with registration to make sure we have an up-to-date contact number.  You can call registration at [phone] to update your information.  For questions about your hospital bill, please call [phone].  For questions about your Emergency Dept Physician bill please call [phone].      FREE HEALTH SERVICES  If you need help with health or  social services, please call 2-1-1 for a free referral to resources in your area.  2-1-1 is a free service connecting people with information on health insurance, free clinics, pregnancy, mental health, dental care, food assistance, housing, and substance abuse counseling.  Also, available online at:  http://www.[place].org    MEDICAL RECORDS AND TESTS  Certain laboratory test results do not come back the same day, for example urine cultures.   We will contact you if other important findings are noted.  Radiology films are often reviewed again to ensure accuracy.  If there is any discrepancy, we will notify you.      Please call [phone] to pick up a complimentary CD of any radiology studies performed.  If you or your doctor would like to request a copy of your medical records, please call [phone].      ORTHOPEDIC INJURY   Please know that significant injuries can exist even when an initial x-ray is read as normal or negative.  This can occur because some fractures (broken bones) are not initially visible on x-rays.  For this reason, close outpatient follow-up with your primary care doctor or bone specialist (orthopedist) is required.    MEDICATIONS AND FOLLOWUP  Please be aware that some prescription medications can cause drowsiness.  Use caution when driving or operating machinery.    The examination and treatment you have received in our Emergency Department is provided on an emergency basis, and is not intended  to be a substitute for your primary care physician.  It is important that your doctor checks you again and that you report any new or remaining problems at that time.      24 HOUR PHARMACIES  The nearest 24 hour pharmacy is:    [facility]  [address]  [place], [place] [address]  [phone]      ASSISTANCE WITH INSURANCE    Affordable Care Act  [place])  Call to start or finish an application, compare plans, enroll or ask a question.  [phone]  TTY:  [phone]  Web:  Healthcare.gov    Help Enrolling in [facility]  [employer] [place]  661-459-7317 (TOLL-FREE)  [phone] (TTY)  Web:  Http://www.coverva.org    Local Help Enrolling in the [facility]  [place] [facility]  [phone] (MAIN)  Email:  health-help@nvfs .org  Web:  BlackjackMyths.is  Address:  [address] [place], [place] [address]    SEDATING MEDICATIONS  Sedating medications include strong pain medications (e.g. narcotics), muscle relaxers, benzodiazepines (used for anxiety and as muscle relaxers), Benadryl/diphenhydramine and other antihistamines for allergic reactions/itching, and other medications.  If you are unsure if you have received a sedating medication, please ask your physician or nurse.  If you received a sedating medication: DO NOT drive a car. DO NOT operate machinery. DO NOT perform jobs where you need to be alert.  DO NOT drink alcoholic beverages while taking this medicine.     If you get dizzy, sit or lie down at the first signs. Be careful going up and down stairs.  Be extra careful to prevent falls.     Never give this medicine to others.     Keep this medicine out of reach of children.     Do not take or save old medicines. Throw them away when outdated.     Keep all medicines in a cool, dry place. DO NOT keep them in your bathroom medicine cabinet or in a cabinet above the stove.    MEDICATION REFILLS  Please be aware that we cannot refill any prescriptions through the ER. If you need further treatment from what is provided at your ER visit, please follow up with your primary care doctor or your pain management specialist.    FREESTANDING EMERGENCY DEPARTMENTS OF [facility]  Did you know [facility] has two freestanding ERs located just a few miles away?  [facility] [place] and [address] ER of [place] have short wait times, easy free parking directly in front of the building and top patient satisfaction scores - and the same Board  Certified Emergency Medicine doctors as [facility].        Allergic Reaction    You have been seen for an allergic reaction.    An allergic reaction is when your body reacts to something it comes in contact with. This can be something you ate. It can also be something that got on your skin or that you breathed in. Insect bites sometimes cause this reaction. Wasp, hornet and bee stings often cause this reaction.    Allergic reactions can cause a few things to happen. Some people get hives (large raised welts). Others get blistered skin. More serious reactions include swelling of the lips and/or tongue. They also include difficulty breathing. This can be with or without wheezing.    Often, the exact cause of the allergic reaction is never found.    If you find  you are allergic to something, avoid it. Future allergic reactions could get much worse.    General treatment for an allergic reaction includes antihistamines like diphenhydramine (Benadryl) or prescription strength hydroxyzine (Atarax) and steroids. Some antacids also act like antihistamines. These include famotidine (Pepcid), ranitidine (Zantac) or cimetidine (Tagamet). These are often used to help with the allergic reaction.    If you get a steroid prescription, it is important to finish the entire prescription. The allergic reaction can come back suddenly (rebound) if you suddenly stop the steroid too early.    YOU SHOULD SEEK MEDICAL ATTENTION IMMEDIATELY, EITHER HERE OR AT THE NEAREST EMERGENCY DEPARTMENT, IF ANY OF THE FOLLOWING OCCURS:   Your lips or tongue get swollen.   You have trouble breathing or start wheezing.   Your rash seems to get infected. Signs of infection are skin redness, pain, pus, swelling or fever (temperature higher than 100.57F / 38C).      Contact Dermatitis    You have been diagnosed with contact dermatitis.    Contact dermatitis is a skin irritation. It is caused by contact with something. Sometimes  the rash is from an allergic reaction. Sometimes it is from a simple chemical irritation. Some symptoms are itching and redness of the skin. In bad cases, there may be blistering. This condition is not often dangerous. However, it can be frustrating.    Contact dermatitis can be caused, for example, by: detergents, soaps and shampoos. It can be caused by nickel (found in jewelry) or poison ivy/oak. Other possible causes are wool, clothing starch, cleaning fluids, etc.    Often, the item that caused the rash cannot be identified. If you have contact dermatitis often, see a dermatologist (skin doctor) or allergist. There, you will have tests to find out the cause. If you are lucky enough to find out the cause, you can avoid it in the future.    General treatment for this condition includes:   Antihistamines (anti-itch medicines). Some antihistamines can limit itching. This includes diphenhydramine (Benadryl): 1-2 (25 mg) tablets every 6 hours). You can get this medicine without a prescription. It also includes prescription strength hydroxyzine (Atarax). Antihistamines can make you drowsy. Therefore, use them with caution. Avoid them if you must be alert (on the job or when driving, for example).   Steroid (cortisone) cream. Apply steroid creams sparingly (in small amounts) 2 times each day to the affected areas. These creams can be used until the skin irritation is gone and the skin feels normal to the touch. Put steroid creams on clean, damp skin. Then cover with a thick layer of moisturizer. As a general rule, do not use steroid cream on the face for more than 7 days. Do not use on other areas of the body for more than 14 days. Follow your doctor's instructions if he or she says that longer use is okay.   Moisturizers. Put on skin moisturizers many times a day. 5 times or more is a good amount. Good moisturizers "seal in" water and moisture to the skin. Use them directly after (on top of) steroid creams.  Also use them (alone) many more times to keep the skin from looking and feeling dry. Use greasy, stiff moisturizers that are too thick to pour. Eucerin cream (not lotion) is an example of a good brand. It can be bought over the counter (without a prescription). Vaseline is a less expensive, good skin moisturizer. Food shortening (Crisco) is another example of such a moisturizer.  YOU SHOULD SEEK MEDICAL ATTENTION IMMEDIATELY, EITHER HERE OR AT THE NEAREST EMERGENCY DEPARTMENT, IF ANY OF THE FOLLOWING OCCURS:   Symptoms get worse even with treatment.   You get signs of infection at the affected areas. Signs include redness, pus, pain, swelling or fever (temperature higher than 100.1F / 38C).

## 2017-12-26 NOTE — ED Provider Notes (Signed)
ATTENDING NOTE    I saw and evaluated the patient. Discussed with resident/ midlevel and agree with the resident's/ midlevel's findings and plan as documented in the resident's note.    CC: Ann Patterson is a 67 y.o. female with a history of polio, HLD, spinal meningitis, and neuropathy c/o a rash to the left arm and right thigh. She notes the rash is highly itchy and describes the skin lesions as "little pimples".  Exam: erythematous vesicular rash to her left antecubital area  Lab: N/A  Studies: N/A  Plan: steroids, benadryl, discharge home, follow up with Dermatology    Georgina Peer    I was acting as a scribe for Shara Blazing, MD on [name]    Treatment Team: Scribe: Farrell Ours is scribing for me on [name]. This note and the patient instructions accurately reflect work and decisions made by me.  Shara Blazing, MD       Shara Blazing, MD  12/26/17 [phone]

## 2017-12-27 ENCOUNTER — Other Ambulatory Visit (INDEPENDENT_AMBULATORY_CARE_PROVIDER_SITE_OTHER): Payer: Self-pay

## 2017-12-27 DIAGNOSIS — M542 Cervicalgia: Secondary | ICD-10-CM

## 2017-12-27 DIAGNOSIS — M5412 Radiculopathy, cervical region: Secondary | ICD-10-CM

## 2017-12-27 MED ORDER — PREGABALIN 150 MG PO CAPS
150.00 mg | ORAL_CAPSULE | Freq: Three times a day (TID) | ORAL | 5 refills | Status: DC
Start: 2017-12-27 — End: 2018-02-23

## 2017-12-27 NOTE — ED Provider Notes (Signed)
[facility] EMERGENCY DEPARTMENT APP H&P      Visit date: 12/25/2017      CLINICAL SUMMARY          Diagnosis:    .     Final diagnoses:   Allergic dermatitis         MDM Notes:              Disposition:          ED Disposition     ED Disposition Condition Date/Time Comment    Discharge  Tue Dec 26, 2017 12:48 AM [name] discharge to home/self care.    Condition at disposition: Stable                     CLINICAL INFORMATION        HPI:      Chief Complaint: Rash  .    Ann Patterson is a 67 y.o. female that  has a past medical history of Headache; Hyperlipidemia; Low back pain; Meningitis spinal (1956); Neuropathy; Pneumonia (2015); Polio; Polio (1956); and Vertigo.  Presents with moderate to severe itching rash to her left antecubital region, left and and right medial proximal thigh.   Onset 3 days ago after starting z pack and scopolamine.  No trouble breathing or sob. No swelling to mouth or throat.  Pt admits to working in the yard 1 week ago but no other new exposures. She believes it is the medication, Azithromycin.      History obtained from: Patient          ROS:      Positive and negative ROS elements as per HPI.      Physical Exam:      Pulse 95  BP 141/72  Resp 18  SpO2 95 %  Temp 98 F (36.7 C)            Constitutional: Vital signs reviewed. No Acute Distress.      Head: Normocephalic, atraumatic  Eyes: PERRLA, EOMI bilateral, Conjunctiva Normal,  No discharge. Lids without swelling or lesions  Ears: Normal pinna, EACs patent, TMs normal and intact  Nose: Symmetrical, Mucous membranes normal, No septal changes  Throat: Tonsils without swelling, exudate or erythema. Uvula non swollen, no erythema or edema.   Mouth:  MM moist.  Lips: no swelling or lesions. Tongue: No swelling or lesions  Neck: Normal range of motion. No lymphadenopathy.  Non-tender. No thyroid enlargement or mass  Respiratory/Chest: Clear to auscultation bilateral. No respiratory distress.    Cardiovascular: Heart: Regular rate and rhythm. No murmur, No gallops, No rubs. Pulses: + 2 bilateral radial and Dorsalis pedis. Cap refill less than 3 sec.    Abdomen: Soft.  No tenderness. No masses or hepatosplenomegaly. Normo-active bowel sounds.   Neurological: Alert and oriented x 3. CN II-XII grossly intact.  No focal motor deficits. Speech normal. Sensory Intact to light touch to all distal extremeties  Skin: Warm and dry. Vesicular rash coalesced and erythematous to left antecubital region. Some in linear pattern consistent with poison ivy.    One vesicular lesion to right 2nd web space.  Smaller vesicular rash to proximal inner right thigh.  Good turgor.   Psychiatric: Normal affect. Normal concentration. Good eye contact              PAST HISTORY        Primary Care Provider: Shon Baton, MD        PMH/PSH:    .  Past Medical History:   Diagnosis Date   . Headache    . Hyperlipidemia    . Low back pain    . Meningitis spinal 1956    spinal and "regular   . Neuropathy     upper c spine issues, + carpal tunnel symptoms bilaterally   . Pneumonia 2015   . Polio    . 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 Vertigo        She has a past surgical history that includes Hysterectomy; Appendectomy; Carpal tunnel release; Back surgery; Fracture surgery; REPAIR, HAND, NERVE (12/10/2012); REPAIR, LIGAMENT (Left, 03/02/2015); and ORIF, FINGER (Left, 03/02/2015).      Social/Family History:      She reports that she has been smoking Cigarettes.  She has a 15.00 pack-year smoking history. She has never used smokeless tobacco. She reports that she drinks alcohol. She reports that she uses drugs, including Marijuana, about 2 times per week.    Family History   Problem Relation Age of Onset   . Myocardial Infarction Mother    . Diverticulitis Sister    . Diabetes Maternal Grandmother    . Brain cancer Maternal Grandfather    . Breast cancer Neg Hx          Listed Medications on Arrival:    .     Home Medications     Med List Status:  In Progress  Set By: Mamie Laurel, RN at 12/26/2017 12:16 AM                fish oil-omega-3 fatty acids 1000 MG capsule     Take 1 g by mouth daily.       pregabalin (LYRICA) 150 MG capsule     Take 1 capsule (150 mg total) by mouth 3 (three) times daily     pregabalin (LYRICA) 25 MG capsule     Take 1 capsule (25 mg total) by mouth 3 (three) times daily     raloxifene (EVISTA) 60 MG tablet     Take 1 tablet (60 mg total) by mouth daily.          Allergies: She is allergic to gadolinium; iodine; and penicillins.            VISIT INFORMATION        Clinical Course in the ED:             Medications Given in the ED:    .     ED Medication Orders     Start Ordered     Status Ordering Provider    12/26/17 0130 12/26/17 0015    2 times daily     Route: Topical     Discontinued Elzie Rings ANDRE    12/26/17 0015 12/26/17 0014  predniSONE (DELTASONE) tablet 40 mg  Once     Route: Oral  Ordered Dose: 40 mg     Last MAR action:  Given Almira Coaster    12/26/17 0014 12/26/17 0014    Every 6 hours PRN     Route: Oral  Ordered Dose: 25 mg     Discontinued Almira Coaster            Procedures:      Procedures      Interpretations:      O2 sat-           saturation: 95 %; Oxygen use: room air; Interpretation: Normal  RESULTS        Lab Results:      Results     ** No results found for the last 24 hours. **              Radiology Results:      No orders to display               Scribe Attestation:      No scribe involved in the care of this patient                            Almira Coaster, [place]  12/27/17 [phone]

## 2018-01-24 ENCOUNTER — Other Ambulatory Visit: Payer: Self-pay | Admitting: Obstetrics & Gynecology

## 2018-01-24 ENCOUNTER — Other Ambulatory Visit: Payer: Self-pay | Admitting: Cardiovascular Disease

## 2018-01-29 ENCOUNTER — Encounter (INDEPENDENT_AMBULATORY_CARE_PROVIDER_SITE_OTHER): Payer: Self-pay | Admitting: Vascular Neurology

## 2018-01-29 ENCOUNTER — Ambulatory Visit (INDEPENDENT_AMBULATORY_CARE_PROVIDER_SITE_OTHER): Payer: Medicare Other | Admitting: Vascular Neurology

## 2018-01-29 VITALS — BP 92/64 | HR 65 | Ht 61.5 in | Wt 91.2 lb

## 2018-01-29 DIAGNOSIS — Z79899 Other long term (current) drug therapy: Secondary | ICD-10-CM

## 2018-01-29 DIAGNOSIS — M5412 Radiculopathy, cervical region: Secondary | ICD-10-CM

## 2018-01-29 DIAGNOSIS — G5603 Carpal tunnel syndrome, bilateral upper limbs: Secondary | ICD-10-CM

## 2018-01-29 DIAGNOSIS — M542 Cervicalgia: Secondary | ICD-10-CM

## 2018-01-29 MED ORDER — PREGABALIN 25 MG PO CAPS
ORAL_CAPSULE | ORAL | 5 refills | Status: DC
Start: 2018-01-29 — End: 2018-02-23

## 2018-01-29 NOTE — Patient Instructions (Signed)
Our plan:     Continue Lyrica 175 mg in AM and midday, increase PM dose to 200mg .        Today's Visit:      In today's visit, I reviewed your medications and records relating your health - prior testing, blood work, reports of other health care providers present in your electronic medical record.     If you have pertinent records from any non-[facility] doctors that you would like to review, please have them sent to [place] or bring to them to your next office visit.     A copy of today's visit will be sent to your referring doctor and/or primary care doctor, if you have one listed in our system.    Let me know if there are things we could have done better for your office visit.    Patient satisfaction survey:      If you receive a patient satisfaction survey, I would greatly appreciate it if you would complete it. We value your feedback.     Contact me online:      Patient Portal online - Please sign up for MyChart -- this is the best way to communicate with your team here.  There is a messaging feature, where you can [place] messages anytime of day.  It is the best way to communicate with [place] and get test results, medication refills, or ask questions.     You can expect to get a response within 24-48 hours during weekdays.  If you do not receive a timely response, resend your request and inform us. My goal is for every question answered ever day.  Average response for a phone call maybe 3 days due to the volume we receive (which is why MyChart is preferred).    If you are having a medical emergency -- call 911, DO NOT SEND A MESSAGE THROUGH MYCHART.     Coupons for medication:      If you have any trouble affording your medications, check out www.goodrx.com for coupons and competitive prices in your area.  If you need further assistance, let us know so we can work with you and your insurance to make sure you get the best care.    Thank you for trusting me with your health.      Take care,     Artemio Aly, MD  [facility] Neurology

## 2018-01-29 NOTE — Progress Notes (Signed)
Subjective:      Patient ID: Ann Patterson    HPI    The patient says that she initially gained a little bit of weight but then lost it and is now back to around 90 pounds.  She has been under a lot of stress over the last several weeks.  She had problems with fruit flies in her house and then her sink are clogged up.  Throughout all, her neck has been doing relatively well.  Occasionally she will have tightness in the right trapezius, but usually will put some pressure on it and that helps.  She had increase the Lyrica to 175 mg 3 times a day and that seems to help a bit more with the pain.    Her hands are also doing much better.  Occasionally she will have some finger stiffness on the right side, but otherwise reports no new symptoms.    Review of Systems    Constitutional: Negative for fever.   Respiratory: Negative for shortness of breath.    Cardiovascular: Negative for chest pain.   Gastrointestinal: Negative for abdominal pain.   Neurological: Negative for dizziness, weakness, numbness and headaches.   Psychiatric/Behavioral: Negative for sleep disturbance.   All other systems reviewed and are negative except pertinent positives as noted above in HPI.         Objective:      Vitals:    01/29/18 1236   BP: 92/64   Pulse: 65       NEUROLOGICAL EXAM    Mental Status: The patient was awake, alert, appeared oriented and was appropriate. Speech was fluent and the patient followed commands well. Attention, concentration, and memory appeared intact. Fund of knowledge appeared appropriate for education level.   Cranial Nerves: Pupils were equally round and reactive to light bilaterally. Extraocular movements were intact without nystagmus. Hearing was intact to conversational speech. Face was symmetric. Tongue appeared midline.   Motor: Good/full strength throughout without clear focality or weakness. Bulk appeared normal for body habitus. No obvious tremor noted.   Sensation: light touch was grossly intact.    Coordination: Finger to nose testing was intact without dysmetria.   Gait: Normal and steady.        Assessment:       1. Neck pain    2. Cervical radiculopathy    3. Carpal tunnel syndrome on both sides    4. Medication management    5.      Stress.      Plan:      1.  Increase nighttime Lyrica to 200 mg and keep the morning and daytime dose at 175 mg.  2.  Continue other medications as prescribed.  3.  She is now to get physical therapy because of financial issues.  4.  Stress reduction techniques as needed.  5.  Follow-up in 6 months.      The patient will return for follow-up as outlined above. If there are any problems with medications (if prescribed) or questions about any available test results, or if there are any new symptoms or changes in current symptoms, the patient is instructed to call the office.       Jag Lenz B. Stacy Gardner, MD  Board Certified, Neurology  Board Certified, Clinical Neurophysiology  Board Certified, Vascular Neurology  Board Certified, Sleep Medicine

## 2018-01-31 ENCOUNTER — Other Ambulatory Visit: Payer: Self-pay | Admitting: Obstetrics & Gynecology

## 2018-02-23 ENCOUNTER — Ambulatory Visit (INDEPENDENT_AMBULATORY_CARE_PROVIDER_SITE_OTHER): Payer: Medicare Other | Admitting: Vascular Neurology

## 2018-02-23 ENCOUNTER — Encounter (INDEPENDENT_AMBULATORY_CARE_PROVIDER_SITE_OTHER): Payer: Self-pay | Admitting: Vascular Neurology

## 2018-02-23 VITALS — BP 130/78 | HR 81 | Resp 18 | Ht 61.0 in | Wt 91.0 lb

## 2018-02-23 DIAGNOSIS — M542 Cervicalgia: Secondary | ICD-10-CM

## 2018-02-23 DIAGNOSIS — Z79899 Other long term (current) drug therapy: Secondary | ICD-10-CM

## 2018-02-23 DIAGNOSIS — M5412 Radiculopathy, cervical region: Secondary | ICD-10-CM

## 2018-02-23 DIAGNOSIS — G5603 Carpal tunnel syndrome, bilateral upper limbs: Secondary | ICD-10-CM

## 2018-02-23 MED ORDER — PREGABALIN 200 MG PO CAPS
200.00 mg | ORAL_CAPSULE | Freq: Three times a day (TID) | ORAL | 5 refills | Status: DC
Start: 2018-02-23 — End: 2018-04-05

## 2018-02-23 NOTE — Progress Notes (Signed)
Subjective:      Patient ID: [name]    HPI    The patient continues to do relatively well clinically.  She has been taking Lyrica 200 mg in the morning, 175 mg midday and 200 in the evening.  She also does exercises and stays very active during the day and this regimen has been helping with her pain.  She had run into a pharmacy issue with the middle of the day dose.  Sometimes she has some carpal tunnel symptoms when general they are stable.    Review of Systems    Constitutional: Negative for fever.   Respiratory: Negative for shortness of breath.    Cardiovascular: Negative for chest pain.   Gastrointestinal: Negative for abdominal pain.   Neurological: Negative for dizziness, weakness, numbness and headaches.   Psychiatric/Behavioral: Negative for sleep disturbance.   All other systems reviewed and are negative except pertinent positives as noted above in HPI.         Objective:      Vitals:    02/23/18 1450   BP: 130/78   Pulse: 81   Resp: 18       NEUROLOGICAL EXAM    Mental Status: The patient was awake, alert, appeared oriented and was appropriate. Speech was fluent and the patient followed commands well. Attention, concentration, and memory appeared intact. Fund of knowledge appeared appropriate for education level.   Cranial Nerves: Pupils were equally round and reactive to light bilaterally. Extraocular movements were intact without nystagmus. Hearing was intact to conversational speech. Face was symmetric. Tongue appeared midline.   Motor: Good/full strength throughout without clear focality or weakness. Bulk appeared normal for body habitus. No obvious tremor noted.   Sensation: light touch was grossly intact.   Coordination: Finger to nose testing was intact without dysmetria.   Gait: Normal and steady.        Assessment:       1. Neck pain    2. Cervical radiculopathy    3. Carpal tunnel syndrome on both sides    4. Medication management          Plan:      1.  Continue Lyrica but we decided  to increase to 200 mg 3 times a day.  2.  Continue to stay active is able to  3.  Follow-up in 6 months.      The patient will return for follow-up as outlined above. If there are any problems with medications (if prescribed) or questions about any available test results, or if there are any new symptoms or changes in current symptoms, the patient is instructed to call the office.       Ishmail Mcmanamon B. Stacy Gardner, MD  Board Certified, Neurology  Board Certified, Clinical Neurophysiology  Board Certified, Vascular Neurology  Board Certified, Sleep Medicine

## 2018-02-23 NOTE — Patient Instructions (Signed)
Our plan:     Change Lyrica to 200 mg three times a day.        Today's Visit:      In today's visit, I reviewed your medications and records relating your health - prior testing, blood work, reports of other health care providers present in your electronic medical record.     If you have pertinent records from any non-[place] doctors that you would like to review, please have them sent to [place] or bring to them to your next office visit.     A copy of today's visit will be sent to your referring doctor and/or primary care doctor, if you have one listed in our system.    Let me know if there are things we could have done better for your office visit.    Patient satisfaction survey:      If you receive a patient satisfaction survey, I would greatly appreciate it if you would complete it. We value your feedback.     Contact me online:      Patient Portal online - Please sign up for MyChart -- this is the best way to communicate with your team here.  There is a messaging feature, where you can [place] messages anytime of day.  It is the best way to communicate with [place] and get test results, medication refills, or ask questions.     You can expect to get a response within 24-48 hours during weekdays.  If you do not receive a timely response, resend your request and inform us. My goal is for every question answered ever day.  Average response for a phone call maybe 3 days due to the volume we receive (which is why MyChart is preferred).    If you are having a medical emergency -- call 911, DO NOT SEND A MESSAGE THROUGH MYCHART.     Coupons for medication:      If you have any trouble affording your medications, check out www.goodrx.com for coupons and competitive prices in your area.  If you need further assistance, let us know so we can work with you and your insurance to make sure you get the best care.    Thank you for trusting me with your health.      Take care,     Artemio Aly, MD  [facility] Neurology

## 2018-03-02 ENCOUNTER — Ambulatory Visit
Admission: RE | Admit: 2018-03-02 | Discharge: 2018-03-02 | Disposition: A | Discharge status: Home / Self Care | Payer: Medicare Other | Source: Ambulatory Visit | Attending: Cardiovascular Disease | Admitting: Cardiovascular Disease

## 2018-03-02 DIAGNOSIS — G629 Polyneuropathy, unspecified: Secondary | ICD-10-CM | POA: Insufficient documentation

## 2018-03-02 DIAGNOSIS — J449 Chronic obstructive pulmonary disease, unspecified: Secondary | ICD-10-CM | POA: Insufficient documentation

## 2018-03-02 LAB — BASIC METABOLIC PANEL
BUN: 12 mg/dL (ref 7.0–19.0)
CO2: 29 mEq/L (ref 21–29)
Calcium: 9.4 mg/dL (ref 8.5–10.5)
Chloride: 104 mEq/L (ref 100–111)
Creatinine: 0.9 mg/dL (ref 0.4–1.5)
Glucose: 98 mg/dL (ref 70–100)
Potassium: 5.3 mEq/L — ABNORMAL HIGH (ref 3.5–5.1)
Sodium: 141 mEq/L (ref 136–145)

## 2018-03-02 LAB — CBC AND DIFFERENTIAL
Absolute NRBC: 0 10*3/uL (ref 0.00–0.00)
Basophils Absolute Automated: 0.07 10*3/uL (ref 0.00–0.08)
Basophils Automated: 0.8 %
Eosinophils Absolute Automated: 0.19 10*3/uL (ref 0.00–0.44)
Eosinophils Automated: 2.1 %
Hematocrit: 40.7 % (ref 34.7–43.7)
Hgb: 13.1 g/dL (ref 11.4–14.8)
Immature Granulocytes Absolute: 0.02 10*3/uL (ref 0.00–0.07)
Immature Granulocytes: 0.2 %
Lymphocytes Absolute Automated: 3.72 10*3/uL — ABNORMAL HIGH (ref 0.42–3.22)
Lymphocytes Automated: 40.9 %
MCH: 29.2 pg (ref 25.1–33.5)
MCHC: 32.2 g/dL (ref 31.5–35.8)
MCV: 90.6 fL (ref 78.0–96.0)
MPV: 11 fL (ref 8.9–12.5)
Monocytes Absolute Automated: 0.88 10*3/uL — ABNORMAL HIGH (ref 0.21–0.85)
Monocytes: 9.7 %
Neutrophils Absolute: 4.21 10*3/uL (ref 1.10–6.33)
Neutrophils: 46.3 %
Nucleated RBC: 0 /100 WBC (ref 0.0–0.0)
Platelets: 227 10*3/uL (ref 142–346)
RBC: 4.49 10*6/uL (ref 3.90–5.10)
RDW: 19 % — ABNORMAL HIGH (ref 11–15)
WBC: 9.09 10*3/uL (ref 3.10–9.50)

## 2018-03-02 LAB — GFR: EGFR: >60

## 2018-03-02 LAB — HEMOLYSIS INDEX: Hemolysis Index: 10 (ref 0–18)

## 2018-03-17 ENCOUNTER — Emergency Department: Payer: Medicare Other

## 2018-03-17 ENCOUNTER — Emergency Department
Admission: EM | Admit: 2018-03-17 | Discharge: 2018-03-18 | Disposition: A | Discharge status: Home / Self Care | Payer: Medicare Other | Attending: Emergency Medicine | Admitting: Emergency Medicine

## 2018-03-17 DIAGNOSIS — Y902 Blood alcohol level of 40-59 mg/100 ml: Secondary | ICD-10-CM | POA: Insufficient documentation

## 2018-03-17 DIAGNOSIS — Z79899 Other long term (current) drug therapy: Secondary | ICD-10-CM | POA: Insufficient documentation

## 2018-03-17 DIAGNOSIS — F17210 Nicotine dependence, cigarettes, uncomplicated: Secondary | ICD-10-CM | POA: Insufficient documentation

## 2018-03-17 DIAGNOSIS — E785 Hyperlipidemia, unspecified: Secondary | ICD-10-CM | POA: Insufficient documentation

## 2018-03-17 DIAGNOSIS — S60419A Abrasion of unspecified finger, initial encounter: Secondary | ICD-10-CM

## 2018-03-17 DIAGNOSIS — Z7289 Other problems related to lifestyle: Secondary | ICD-10-CM

## 2018-03-17 DIAGNOSIS — S0990XA Unspecified injury of head, initial encounter: Secondary | ICD-10-CM | POA: Insufficient documentation

## 2018-03-17 DIAGNOSIS — G629 Polyneuropathy, unspecified: Secondary | ICD-10-CM | POA: Insufficient documentation

## 2018-03-17 DIAGNOSIS — S60414A Abrasion of right ring finger, initial encounter: Secondary | ICD-10-CM | POA: Insufficient documentation

## 2018-03-17 DIAGNOSIS — F10129 Alcohol abuse with intoxication, unspecified: Secondary | ICD-10-CM | POA: Insufficient documentation

## 2018-03-17 DIAGNOSIS — W010XXA Fall on same level from slipping, tripping and stumbling without subsequent striking against object, initial encounter: Secondary | ICD-10-CM | POA: Insufficient documentation

## 2018-03-17 DIAGNOSIS — Z789 Other specified health status: Secondary | ICD-10-CM

## 2018-03-17 DIAGNOSIS — S0003XA Contusion of scalp, initial encounter: Secondary | ICD-10-CM | POA: Insufficient documentation

## 2018-03-17 DIAGNOSIS — S76011A Strain of muscle, fascia and tendon of right hip, initial encounter: Secondary | ICD-10-CM | POA: Insufficient documentation

## 2018-03-17 MED ORDER — SODIUM CHLORIDE 0.9 % IV BOLUS
1000.00 mL | Freq: Once | INTRAVENOUS | Status: DC
Start: 2018-03-17 — End: 2018-03-18

## 2018-03-17 NOTE — ED Provider Notes (Signed)
[facility] EMERGENCY DEPARTMENT H&P      Visit date: 03/17/2018      CLINICAL SUMMARY           Diagnosis:    .     Final diagnoses:   Hematoma of scalp, initial encounter   Strain of right hip, initial encounter   Abrasion of finger, initial encounter   Alcohol use   Minor head injury, initial encounter         MDM Notes:      67 year old female presents after mechanical fall in the setting of alcohol intoxication, complaining of right hip pain and posterior scalp hematoma. CT head/c-spine neg, XR imaging neg. Discussed home supportive care, recommend close PCP f/u.         Disposition:         Discharge          Discharge Prescriptions     None                         CLINICAL INFORMATION        HPI:      Chief Complaint: Fall  .    Henny Strauch is a 67 y.o. female who presents with moderate R hip pain after falling on the floor earlier this evening. +alcohol use.  Pt states she tripped and had a mechanical fall, hitting the back of her head.  C/o bump and soreness to posterior scalp, as well as right hip pain.  Unable to ambulate due to hip pain. Also c/o bleeding and abrasion to right ring finger.      Received TDAP in 2014    No LOC.  Denies neck/back pain, chest/abd pain, or other injuries.    History obtained from: patient, EMS          ROS:      Positive and negative ROS elements as per HPI.  All other systems reviewed and negative.      Physical Exam:      Pulse 82  BP 133/64  Resp 16  SpO2 99 %  Temp 98.2 F (36.8 C)    Constitutional: Vital signs reviewed. Well appearing. Inebriated.   Head: Normocephalic, atraumatic. Hematoma to posterior scalp  Eyes:  PERRL. EOMI. No conjunctival injection. No discharge.   ENT: Mucous membranes dry. No intraoral bleeding or injury. No epistaxis. No hemotympanum bilaterally.    Neck: No midline TTP or stepoffs.   Respiratory/Chest: Clear to auscultation, no w/r/r. No respiratory distress.  Chest wall nt.  Cardiovascular:  Regular rate and rhythm. No significant murmur appreciated.   Abdomen: Soft and non-tender. No guarding. No peritoneal signs  Back: No midline ttp or step-offs  Pelvis: stable  UpperExtremity:  Abrasion to dorsal aspect of R 5th digit, normal ROM, normal sensation.  LowerExtremity: R hip tender to palpation. Normal dorsi flexion and plantar flexion. Distal sensation intact.   Neurological:  GCS 15.  Speech normal. Moves extremities equally. No facial droop.   Skin: Warm and dry. No rash.  Psychiatric: Normal affect. Normal concentration.                 PAST HISTORY        Primary Care Provider: Pcp, None, MD        PMH/PSH:    .     Past Medical History:   Diagnosis Date   . Headache    . Hyperlipidemia    . Low back pain    .  Meningitis spinal 1956    spinal and "regular   . Neuropathy     upper c spine issues, + carpal tunnel symptoms bilaterally   . Pneumonia 2015   . Polio    . 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 Vertigo        She has a past surgical history that includes Hysterectomy; Appendectomy; Carpal tunnel release; Back surgery; Fracture surgery; REPAIR, HAND, NERVE (12/10/2012); REPAIR, LIGAMENT (Left, 03/02/2015); and ORIF, FINGER (Left, 03/02/2015).      Social/Family History:      She reports that she has been smoking cigarettes. She has a 15.00 pack-year smoking history. She has never used smokeless tobacco. She reports current alcohol use. She reports current drug use. Frequency: 2.00 times per week. Drug: Marijuana.    Family History   Problem Relation Age of Onset   . Myocardial Infarction Mother    . Diverticulitis Sister    . Diabetes Maternal Grandmother    . Brain cancer Maternal Grandfather    . Breast cancer Neg Hx          Listed Medications on Arrival:    .     Home Medications             betamethasone dipropionate (DIPROLENE) 0.05 % cream     Apply topically 2 (two) times daily     fish oil-omega-3 fatty acids 1000 MG capsule     Take 1 g by mouth daily.       hydrOXYzine (VISTARIL) 25 MG capsule     Take 1  capsule (25 mg total) by mouth 3 (three) times daily as needed for Itching     pregabalin (LYRICA) 200 MG capsule     Take 1 capsule (200 mg total) by mouth 3 (three) times daily     raloxifene (EVISTA) 60 MG tablet     Take 1 tablet (60 mg total) by mouth daily.         Allergies: She is allergic to gadolinium; iodine; and penicillins.            VISIT INFORMATION        Clinical Course in the ED:        Assessment & Plan:  67 y.o.  female presents with hip pain after fall    Differential: scalp hematoma, ICH, C-spine fracture, hip fracture, hip dislocation, hip strain, alcohol intoxication, finger abrasion/laceration, finger fracture    Plan:   Check CT head and C-spine.   Check XR chest, pelvis, hip, and finger.   Check labs, electrolytes   Give IVF bolus  Give analgesics PRN.   Abrasion/laceration wound care and repair  Close hemodynamic monitoring  Reassess and dispo after treatment/results    2:20 AM  CT/XR imaging negative  Suspect scalp hematoma, minor contusions  Reassessed pt: Feels improved. Understands results. Comfortable with d/c  Discussed home supportive care  Recommend pcp f/u two days  Pt well-appearing, smiling, going home with family         Medications Given in the ED:    .     ED Medication Orders (From admission, onward)    Start Ordered     Status Ordering Provider    03/18/18 0044 03/18/18 0043  ketorolac (TORADOL) injection 15 mg  Once     Route: Intravenous  Ordered Dose: 15 mg     Last MAR action:  Given Magaret Justo    03/17/18 2347 03/17/18 2346  sodium chloride 0.9 % bolus 1,000 mL  Once     Route: Intravenous  Ordered Dose: 1,000 mL     Ordered Jusitn Salsgiver            Procedures:      Lac Repair  Date/Time: 03/18/2018 1:42 AM  Performed by: Rennis Petty, MD  Authorized by: Rennis Petty, MD     Consent:     Consent obtained:  Verbal    Consent given by:  Patient    Risks discussed:  Infection, pain and poor cosmetic result  Laceration details:     Location:  Finger    Finger  location:  R small finger    Length (cm):  1  Repair type:     Repair type:  Simple  Pre-procedure details:     Preparation:  Patient was prepped and draped in usual sterile fashion  Exploration:     Hemostasis achieved with:  Direct pressure  Treatment:     Area cleansed with:  Saline    Amount of cleaning:  Standard    Irrigation solution:  Sterile saline    Irrigation volume:  50cc    Irrigation method:  Syringe  Skin repair:     Repair method:  Steri-Strips    Number of Steri-Strips:  1  Approximation:     Approximation:  Close  Post-procedure details:     Dressing:  Open (no dressing)    Patient tolerance of procedure:  Tolerated well, no immediate complications  Comments:      Performed by medical student, Marshall Cork under my supervision.          Interpretations:      O2 sat-           saturation: 99 %; Oxygen use: room air; Interpretation: Normal      Monitor -         interpreted by me: normal sinus at 85.    Head CT/trauma - GCS: 15  Age 61 or older and alcohol use     Radiology -     interpreted by me with the following observations: CXR - no acute disease              RESULTS        Lab Results:      Results     Procedure Component Value Units Date/Time    Basic Metabolic Panel [[id number]] Collected:  03/18/18 0111    Specimen:  Blood Updated:  03/18/18 0150     Glucose 93 mg/dL      BUN 08.6 mg/dL      Creatinine 0.8 mg/dL      Calcium 8.9 mg/dL      Sodium 578 mEq/L      Potassium 3.7 mEq/L      Chloride 106 mEq/L      CO2 22 mEq/L     Ethanol (Alcohol) Level [[id number]]  (Abnormal) Collected:  03/18/18 0111    Specimen:  Blood Updated:  03/18/18 0150     Alcohol 46 mg/dL     GFR [[id number]] Collected:  03/18/18 0111     Updated:  03/18/18 0150     EGFR >60.0    CBC with differential [[id number]]  (Abnormal) Collected:  03/18/18 0111    Specimen:  Blood Updated:  03/18/18 0141     WBC 9.24 x10 3/uL      Hgb 12.2 g/dL      Hematocrit 03.4 %      Platelets 206 x10 3/uL  RBC 4.09 x10 6/uL       MCV 88.3 fL      MCH 29.8 pg      MCHC 33.8 g/dL      RDW 19 %      MPV 10.2 fL      Neutrophils 45.9 %      Lymphocytes Automated 41.5 %      Monocytes 9.6 %      Eosinophils Automated 2.1 %      Basophils Automated 0.6 %      Immature Granulocyte 0.3 %      Nucleated RBC 0.0 /100 WBC      Neutrophils Absolute 4.24 x10 3/uL      Abs Lymph Automated 3.83 x10 3/uL      Abs Mono Automated 0.89 x10 3/uL      Abs Eos Automated 0.19 x10 3/uL      Absolute Baso Automated 0.06 x10 3/uL      Absolute Immature Granulocyte 0.03 x10 3/uL      Absolute NRBC 0.00 x10 3/uL     Type and Screen [[id number]] Collected:  03/18/18 0111    Specimen:  Blood Updated:  03/18/18 0137    Narrative:       Place BRID              Radiology Results:      XR Chest  AP Portable   Final Result          1. No visualized acute traumatic injury.      2. Hyperinflated lungs likely related to COPD.      Eloise Harman, MD    03/18/2018 1:57 AM      Hip Right AP and Lateral with Pelvis   Final Result          No acute abnormality.      Eloise Harman, MD    03/18/2018 1:53 AM      Finger Right Minimum 2 Views   Final Result          No fracture or dislocation of the fifth digit.      Eloise Harman, MD    03/18/2018 1:55 AM      CT Head WO Contrast   Final Result         No acute intracranial abnormality.       Eloise Harman, MD    03/18/2018 12:34 AM      CT Cervical Spine without Contrast   Final Result          Postoperative and degenerative changes of the cervical spine. No acute   fracture or traumatic malalignment.      Eloise Harman, MD    03/18/2018 12:38 AM                  Scribe Attestation:      I was acting as a Neurosurgeon for Rennis Petty, MD on [facility]  Treatment Team: Scribe: Alfredia Ferguson     I am the first provider for this patient and I personally performed the services documented. Treatment Team: Scribe: Alfredia Ferguson is scribing for me on [facility]. This note and the patient instructions  accurately reflect work and decisions made by me.  Rennis Petty, MD            Rennis Petty, MD  03/20/18 [phone]

## 2018-03-17 NOTE — ED Notes (Signed)
Bed: N 32  Expected date:   Expected time:   Means of arrival:   Comments:  M 426

## 2018-03-18 ENCOUNTER — Emergency Department: Payer: Medicare Other

## 2018-03-18 LAB — BASIC METABOLIC PANEL
BUN: 10 mg/dL (ref 7.0–19.0)
CO2: 22 mEq/L (ref 22–29)
Calcium: 8.9 mg/dL (ref 8.5–10.5)
Chloride: 106 mEq/L (ref 100–111)
Creatinine: 0.8 mg/dL (ref 0.6–1.0)
Glucose: 93 mg/dL (ref 70–100)
Potassium: 3.7 mEq/L (ref 3.5–5.1)
Sodium: 141 mEq/L (ref 136–145)

## 2018-03-18 LAB — CBC AND DIFFERENTIAL
Absolute NRBC: 0 10*3/uL (ref 0.00–0.00)
Basophils Absolute Automated: 0.06 10*3/uL (ref 0.00–0.08)
Basophils Automated: 0.6 %
Eosinophils Absolute Automated: 0.19 10*3/uL (ref 0.00–0.44)
Eosinophils Automated: 2.1 %
Hematocrit: 36.1 % (ref 34.7–43.7)
Hgb: 12.2 g/dL (ref 11.4–14.8)
Immature Granulocytes Absolute: 0.03 10*3/uL (ref 0.00–0.07)
Immature Granulocytes: 0.3 %
Lymphocytes Absolute Automated: 3.83 10*3/uL — ABNORMAL HIGH (ref 0.42–3.22)
Lymphocytes Automated: 41.5 %
MCH: 29.8 pg (ref 25.1–33.5)
MCHC: 33.8 g/dL (ref 31.5–35.8)
MCV: 88.3 fL (ref 78.0–96.0)
MPV: 10.2 fL (ref 8.9–12.5)
Monocytes Absolute Automated: 0.89 10*3/uL — ABNORMAL HIGH (ref 0.21–0.85)
Monocytes: 9.6 %
Neutrophils Absolute: 4.24 10*3/uL (ref 1.10–6.33)
Neutrophils: 45.9 %
Nucleated RBC: 0 /100 WBC (ref 0.0–0.0)
Platelets: 206 10*3/uL (ref 142–346)
RBC: 4.09 10*6/uL (ref 3.90–5.10)
RDW: 19 % — ABNORMAL HIGH (ref 11–15)
WBC: 9.24 10*3/uL (ref 3.10–9.50)

## 2018-03-18 LAB — ETHANOL: Alcohol: 46 mg/dL — ABNORMAL HIGH

## 2018-03-18 LAB — TYPE AND SCREEN
AB Screen Gel: NEGATIVE
ABO Rh: O POS

## 2018-03-18 LAB — GFR: EGFR: >60

## 2018-03-18 MED ORDER — KETOROLAC TROMETHAMINE 30 MG/ML IJ SOLN
15.00 mg | Freq: Once | INTRAMUSCULAR | Status: AC
Start: 2018-03-18 — End: 2018-03-18
  Administered 2018-03-18: 01:00:00 15 mg via INTRAVENOUS
  Filled 2018-03-18: qty 1

## 2018-03-18 NOTE — Discharge Instructions (Addendum)
You were seen in the ER for:  injury after fall    You had the following tests performed: CT head and c-spine, Xray hip, chest, and finger    You were found to have: no brain or neck injury, no hip fracture, no rib fractures, no finger fracture    You have a small hematoma to the scalp, a strained hip, and an abrasion on your finger.    For this we recommend:  1. Use ice over the area today and tomorrow to reduce swelling.  2. Take tylenol or ibuprofen as needed for hip/head pain.  3. Rest and take it easy for the next few days.  4. The abrasion on your finger was cleansed and covered with a steristrip.  This will fall off on it's own in a few days and the cut should be healed.    See your primary care doctor for follow up in 2 days.    Return to the ER for any worsening of symptoms or other concerns.    Thank you for choosing our hospital for your care.    If any you have any questions, please feel free to call our Emergency Department, and one of our doctors or nurses will be happy to speak with you. [phone].    Sincerely,   Dr. Kathi Ludwig

## 2018-04-05 ENCOUNTER — Encounter (INDEPENDENT_AMBULATORY_CARE_PROVIDER_SITE_OTHER): Payer: Self-pay | Admitting: Vascular Neurology

## 2018-04-05 ENCOUNTER — Ambulatory Visit (INDEPENDENT_AMBULATORY_CARE_PROVIDER_SITE_OTHER): Payer: Medicare Other | Admitting: Vascular Neurology

## 2018-04-05 VITALS — BP 101/59 | HR 71 | Ht 61.0 in | Wt 93.8 lb

## 2018-04-05 DIAGNOSIS — Z9181 History of falling: Secondary | ICD-10-CM

## 2018-04-05 DIAGNOSIS — Z79899 Other long term (current) drug therapy: Secondary | ICD-10-CM

## 2018-04-05 DIAGNOSIS — M5412 Radiculopathy, cervical region: Secondary | ICD-10-CM

## 2018-04-05 DIAGNOSIS — M542 Cervicalgia: Secondary | ICD-10-CM

## 2018-04-05 DIAGNOSIS — G5603 Carpal tunnel syndrome, bilateral upper limbs: Secondary | ICD-10-CM

## 2018-04-05 HISTORY — DX: History of falling: Z91.81

## 2018-04-05 MED ORDER — PREGABALIN 200 MG PO CAPS
200.00 mg | ORAL_CAPSULE | Freq: Three times a day (TID) | ORAL | 5 refills | Status: DC
Start: 2018-04-05 — End: 2018-08-15

## 2018-04-05 NOTE — Progress Notes (Signed)
Subjective:      Patient ID: Ann Patterson    HPI  The patient was recently at the hospital after suffering a mechanical fall.  She did have a little bit of alcohol that night, but says that she was talking her son and then turned around quickly and they bumped into each other and then she stepped back on her heel and then fell.  She hit the top of her head and her right hip.  She was seen in emergency room, and subsequently discharged.    Otherwise she has been taking Lyrica to 1 mg 3 times a day and says it is working excellent.  She has no real pain.  She did have a pharmacy issue but was able to get the medication.    Results for orders placed or performed during the hospital encounter of 03/17/18   CT Head WO Contrast    Narrative    TECHNIQUE: CT of the head without intravenous contrast.     The following dose reduction techniques were utilized: Automated  exposure control and/or adjustment of the mA and/or kV according to  patient size, and the use of iterative reconstruction technique.    INDICATION: drunk fall     COMPARISON: 10/28/2016.    FINDINGS:     Atherosclerotic calcifications. Mild supratentorial white matter changes  suggestive of chronic microvascular ischemia.    No acute intracranial hemorrhage, mass effect, loss of gray-white matter  differentiation, or evidence of acute ventricular outflow obstruction.     No depressed skull fracture or aggressive appearing osseous lesions.     The visualized paranasal sinuses and temporal bone structures are  well-aerated.       Impression    No acute intracranial abnormality.     Ann Harman, MD   03/18/2018 12:34 AM     MRI C-spine:  IMPRESSION:        Postoperative and degenerative changes of the cervical spine. No acute  fracture or traumatic malalignment.    Admission on 03/17/2018, Discharged on 03/18/2018   Component Date Value Ref Range Status   . WBC 03/18/2018 9.24  3.10 - 9.50 x10 3/uL Final   . Hgb 03/18/2018 12.2  11.4 - 14.8 g/dL  Final   . Hematocrit 09/32/3557 36.1  34.7 - 43.7 % Final   . Platelets 03/18/2018 206  142 - 346 x10 3/uL Final   . RBC 03/18/2018 4.09  3.90 - 5.10 x10 6/uL Final   . MCV 03/18/2018 88.3  78.0 - 96.0 fL Final   . MCH 03/18/2018 29.8  25.1 - 33.5 pg Final   . MCHC 03/18/2018 33.8  31.5 - 35.8 g/dL Final   . RDW 32/20/2542 19* 11 - 15 % Final   . MPV 03/18/2018 10.2  8.9 - 12.5 fL Final   . Neutrophils 03/18/2018 45.9  None % Final   . Lymphocytes Automated 03/18/2018 41.5  None % Final   . Monocytes 03/18/2018 9.6  None % Final   . Eosinophils Automated 03/18/2018 2.1  None % Final   . Basophils Automated 03/18/2018 0.6  None % Final   . Immature Granulocyte 03/18/2018 0.3  None % Final   . Nucleated RBC 03/18/2018 0.0  0.0 - 0.0 /100 WBC Final   . Neutrophils Absolute 03/18/2018 4.24  1.10 - 6.33 x10 3/uL Final   . Abs Lymph Automated 03/18/2018 3.83* 0.42 - 3.22 x10 3/uL Final   . Abs Mono Automated 03/18/2018 0.89* 0.21 - 0.85  x10 3/uL Final   . Abs Eos Automated 03/18/2018 0.19  0.00 - 0.44 x10 3/uL Final   . Absolute Baso Automated 03/18/2018 0.06  0.00 - 0.08 x10 3/uL Final   . Absolute Immature Granulocyte 03/18/2018 0.03  0.00 - 0.07 x10 3/uL Final   . Absolute NRBC 03/18/2018 0.00  0.00 - 0.00 x10 3/uL Final   . Glucose 03/18/2018 93  70 - 100 mg/dL Final    Comment: ADA guidelines for diabetes mellitus:  Fasting:  Equal to or greater than 126 mg/dL  Random:   Equal to or greater than 200 mg/dL     . BUN 03/18/2018 10.0  7.0 - 19.0 mg/dL Final   . Creatinine 16/01/9603 0.8  0.6 - 1.0 mg/dL Final   . Calcium 54/12/8117 8.9  8.5 - 10.5 mg/dL Final   . Sodium 14/78/2956 141  136 - 145 mEq/L Final   . Potassium 03/18/2018 3.7  3.5 - 5.1 mEq/L Final   . Chloride 03/18/2018 106  100 - 111 mEq/L Final   . CO2 03/18/2018 22  22 - 29 mEq/L Final   . Alcohol 03/18/2018 46* None Detected mg/dL Final    Comment: Flushing, slowing of reflexes, impaired visual activity:  50-100 mg/dL  Depression of CNS: Greater than 100  mg/dL  Fatalities reported: Greater than 400 mg/dL  Percent weight per volume equals mg/dL divided by 2130     . ABO Rh 03/18/2018 O POS   Final   . AB Screen Gel 03/18/2018 NEG   Final    Comment: @11 /24/19 02:19 by [id number]:  Specimen expires on 03/21/2018 at 23:59.  Note: Specimen Expiration date may no longer be valid if patient has  received a transfusion after the date of specimen collection.  Call Blood Bank for verification.     Marland Kitchen EGFR 03/18/2018 >60.0   Final    Comment: Disease State Reference Ranges:    Chronic Kidney Disease; < 60 ml/min/1.73 sq.m    Kidney Failure; < 15 ml/min/1.73 sq.m    [Calculated using IDMS-Traceable MDRD equation (based on    gender, age and black vs. non-black race) recommended by    [employer] Kidney Disease Education Program. No data    available for non-white, non-black race.]  GFR estimates are unreliable in patients with:    Rapidly changing kidney function or recent dialysis,    extreme age, body size or body composition(obesity,    severe malnutrition). Abnormal muscle mass (limb    amputation, muscle wasting). In these patients,    alternative determinations of GFR should be obtained.           Review of Systems    Constitutional: Negative for fever.   Respiratory: Negative for shortness of breath.    Cardiovascular: Negative for chest pain.   Gastrointestinal: Negative for abdominal pain.   Neurological: Negative for dizziness, weakness, numbness and headaches.   Psychiatric/Behavioral: Negative for sleep disturbance.   All other systems reviewed and are negative except pertinent positives as noted above in HPI.         Objective:      Vitals:    04/05/18 1601   BP: 101/59   Pulse: 71       NEUROLOGICAL EXAM    Mental Status: The patient was awake, alert, appeared oriented and was appropriate. Speech was fluent and the patient followed commands well. Attention, concentration, and memory appeared intact. Fund of knowledge appeared appropriate for education level.   Cranial  Nerves: Pupils were  equally round and reactive to light bilaterally. Extraocular movements were intact without nystagmus. Hearing was intact to conversational speech. Face was symmetric. Tongue appeared midline.   Motor: Good/full strength throughout without clear focality or weakness. Bulk appeared normal for body habitus. No obvious tremor noted.   Sensation: light touch was grossly intact.   Coordination: Finger to nose testing was intact without dysmetria.   Gait: Normal and steady.        Assessment:       1. Hx of fall    2. Neck pain    3. Cervical radiculopathy    4. Carpal tunnel syndrome on both sides    5. Medication management          Plan:      1.  Continue Lyrica 200 mg 3 times a day.  2.  Be mindful of surroundings to avoid future falls.  3.  Follow-up in 6 months.      The patient will return for follow-up as outlined above. If there are any problems with medications (if prescribed) or questions about any available test results, or if there are any new symptoms or changes in current symptoms, the patient is instructed to call the office.       Opaline Reyburn B. Stacy Gardner, MD  Board Certified, Neurology  Board Certified, Clinical Neurophysiology  Board Certified, Vascular Neurology  Board Certified, Sleep Medicine

## 2018-04-05 NOTE — Patient Instructions (Signed)
Our plan:     Lyrica 200mg  three times a day.        Today's Visit:      In today's visit, I reviewed your medications and records relating your health - prior testing, blood work, reports of other health care providers present in your electronic medical record.     If you have pertinent records from any non-[place] doctors that you would like to review, please have them sent to [place] or bring to them to your next office visit.     A copy of today's visit will be sent to your referring doctor and/or primary care doctor, if you have one listed in our system.    Let me know if there are things we could have done better for your office visit.    Patient satisfaction survey:      If you receive a patient satisfaction survey, I would greatly appreciate it if you would complete it. We value your feedback.     Contact me online:      Patient Portal online - Please sign up for MyChart -- this is the best way to communicate with your team here.  There is a messaging feature, where you can [place] messages anytime of day.  It is the best way to communicate with [place] and get test results, medication refills, or ask questions.     You can expect to get a response within 24-48 hours during weekdays.  If you do not receive a timely response, resend your request and inform us. My goal is for every question answered ever day.  Average response for a phone call maybe 3 days due to the volume we receive (which is why MyChart is preferred).    If you are having a medical emergency -- call 911, DO NOT SEND A MESSAGE THROUGH MYCHART.     Coupons for medication:      If you have any trouble affording your medications, check out www.goodrx.com for coupons and competitive prices in your area.  If you need further assistance, let us know so we can work with you and your insurance to make sure you get the best care.    Thank you for trusting me with your health.      Take care,     Artemio Aly, MD  [facility]

## 2018-05-12 NOTE — Progress Notes (Signed)
error 

## 2018-06-27 ENCOUNTER — Encounter (INDEPENDENT_AMBULATORY_CARE_PROVIDER_SITE_OTHER): Payer: Self-pay

## 2018-08-15 ENCOUNTER — Encounter (INDEPENDENT_AMBULATORY_CARE_PROVIDER_SITE_OTHER): Payer: Self-pay | Admitting: Vascular Neurology

## 2018-08-15 ENCOUNTER — Telehealth (INDEPENDENT_AMBULATORY_CARE_PROVIDER_SITE_OTHER): Payer: Medicare Other | Admitting: Vascular Neurology

## 2018-08-15 VITALS — Ht 61.0 in | Wt 93.0 lb

## 2018-08-15 DIAGNOSIS — F17210 Nicotine dependence, cigarettes, uncomplicated: Secondary | ICD-10-CM

## 2018-08-15 DIAGNOSIS — Z79899 Other long term (current) drug therapy: Secondary | ICD-10-CM

## 2018-08-15 DIAGNOSIS — M5412 Radiculopathy, cervical region: Secondary | ICD-10-CM

## 2018-08-15 DIAGNOSIS — F17200 Nicotine dependence, unspecified, uncomplicated: Secondary | ICD-10-CM | POA: Insufficient documentation

## 2018-08-15 DIAGNOSIS — G5603 Carpal tunnel syndrome, bilateral upper limbs: Secondary | ICD-10-CM

## 2018-08-15 DIAGNOSIS — M542 Cervicalgia: Secondary | ICD-10-CM

## 2018-08-15 DIAGNOSIS — Z760 Encounter for issue of repeat prescription: Secondary | ICD-10-CM | POA: Insufficient documentation

## 2018-08-15 MED ORDER — PREGABALIN 200 MG PO CAPS
200.00 mg | ORAL_CAPSULE | Freq: Three times a day (TID) | ORAL | 5 refills | Status: DC
Start: 2018-08-15 — End: 2019-02-17

## 2018-08-15 NOTE — Progress Notes (Signed)
TELEMEDICINE APPOINTMENT    Subjective:      Verbal consent was obtained from the patient for this telemedicine appointment, secondary to ongoing concerns regarding the COVID-19 pandemic.    Patient ID: Ann Patterson  CC:   Chief Complaint   Patient presents with   . Medication Refill       HPI  Since I last saw the patient she says she is doing great.  She continues on Lyrica 200 g 3 times a day and this works very well for any complaints of neck pain or paresthesias that she might have.  She denies any other new neurologic symptoms.    She does admit to smoking 10 cigarettes a day, and says she is trying to cut back.  Her goal would be to cut down to 5 cigarettes a day which she says she was allowed to smoke years ago when she was pregnant.  She indicated at this point she was not interested in a referral to the smoking cessation program.    The following portions of the patient's history were reviewed and updated as appropriate: allergies, current medications, past family history, past medical history, past social history, past surgical history and problem list.     Review of Systems    Constitutional: Negative for fever.   Respiratory: Negative for shortness of breath.    Cardiovascular: Negative for chest pain.   Gastrointestinal: Negative for abdominal pain.   Neurological: Negative for dizziness, weakness, numbness and headaches.   Psychiatric/Behavioral: Negative for sleep disturbance.   All other systems reviewed and are negative except pertinent positives as noted above in HPI.         Objective:     Vitals: Reviewed.   Visit Vitals  Ht 1.549 m (5\' 1" )   Wt (!) 42.2 kg (93 lb)   BMI 17.57 kg/m    Respiratory rate appears normal.    General: WDWN, NAD. Vital signs reviewed.  HEENT: No nasal discharges, no obvious oral masses. No icterus or conjunctival hemorrhage noted.  Neck: Full ROM. No neck masses noted.  Extremities: No edema noted in the hands.   Skin: No obvious rashes or discoloration.   Chest:  No audible wheezing or dyspnea at rest.  Abdomen: No distention, no tenderness or guarding to patient palpation.  Psychiatric: Cooperative with exam, normal affect. Thought process and speech pattern normal.    NEUROLOGICAL EXAM    Mental Status: Awake, alert, oriented. Speech fluent without dysarthria. Follows commands well. Attention, memory, and concentration intact. Fund of knowledge appropriate for education.     Cranial Nerves: Pupils equally round. Extraocular movements are full. No nystagmus seen. Facial sensation to light touch appears intact. Face is symmetric. Hearing is intact to conversational speech. Tongue protrudes midline. Cranial nerves II-XII otherwise appears intact.     Motor: Moves all extremities well, without UE drift. No obvious focal weakness noted. Bulk appears normal. No tremor noted.    Sensation: Light touch intact.     Coordination: Finger to nose intact without dysmetria.     Gait: Normal and steady. Romberg negative. Marches in place.      Assessment:       1. Neck pain    2. Cervical radiculopathy    3. Carpal tunnel syndrome on both sides    4. Medication management    5. Current every day smoker    6.      Prescription refill      Plan:  1.  Continue Lyrica 200 mg 3 times a day.  2.  I counseled the patient on smoking cessation, but she indicated she was not interested in a referral to the smoking cessation program at this time.  3 minutes spent counseling.  3.  Follow-up in 6 months.      The patient will return for follow-up as outlined above. If there are any problems with medications (if prescribed) or questions about any available test results, or if there are any new symptoms or changes in current symptoms, the patient is instructed to call the office.         Marnie Fazzino B. Stacy Gardner, MD  Director, [facility]  Co-Director, [facility], Neurology  Board Certified, Sleep Medicine  Board  Certified, Clinical Neurophysiology  Board Certified, Vascular Neurology        *This note was generated by the Epic EMR system/ Dragon speech recognition and may contain inherent errors or omissions not intended by the user. Grammatical errors, random word insertions, deletions, pronoun errors and incomplete sentences are occasional consequences of this technology due to software limitations. Not all errors are caught or corrected. If there are questions or concerns about the content of this note or information contained within the body of this dictation they should be addressed directly with the author for clarification.*

## 2018-12-09 ENCOUNTER — Encounter (INDEPENDENT_AMBULATORY_CARE_PROVIDER_SITE_OTHER): Payer: Self-pay

## 2018-12-09 ENCOUNTER — Ambulatory Visit (INDEPENDENT_AMBULATORY_CARE_PROVIDER_SITE_OTHER): Payer: Medicare Other | Admitting: Family Medicine

## 2018-12-09 VITALS — BP 110/65 | HR 84 | Temp 98.4°F | Resp 18 | Ht 61.25 in

## 2018-12-09 DIAGNOSIS — L259 Unspecified contact dermatitis, unspecified cause: Secondary | ICD-10-CM

## 2018-12-09 MED ORDER — PREDNISONE 20 MG PO TABS
ORAL_TABLET | ORAL | 0 refills | Status: AC
Start: 2018-12-09 — End: 2018-12-24

## 2018-12-09 NOTE — Patient Instructions (Signed)
Nonpoisonous Spider Bite    The venom from a spider bite can cause a local skin reaction. This often causes local redness, itching, and swelling. This reaction will fade over a few hours to a few days. A spider bite can become infected, so watch for the signs listed below. Sometimes it's hard to tell the difference between a local reaction to the insect bite or sting and an early infection. So your healthcare provider may start you on antibiotics.  Home care  The following guidelines will help you care for your wound at home:   Stay away fromanything that heats up your skin if itching is a problem. This includes hot showers or baths or direct sunlight. This will make the itching worse.   During the first 24 hours, you may put an ice pack on the injury. Use it for no more than 20 minutes at a time every 1 to 2 hours. This will reduce pain and swelling. It will also help with itching. You can make an ice pack by putting ice cubes in a plastic bag that seals at the top. Wrap the bag in a thin, clean towel. Don't put ice or an ice pack directly on the skin. You may also use an over-the-counter spray or cream containing benzocaine to help ease pain. Over-the-counter skin creams containing diphenhydramine or hydrocortisone may help with itching. Remember to review the medicine instructions for any allergies.   If the wound becomes red, wash the area with soap and water every day. Put an antibiotic cream or ointment on the injury 3 times a day as directed.   If your healthcare provider has prescribed oral antibiotics, take them as directed until they are all finished.  Follow-up care  Follow up with your healthcare provider, or as advised.  When to get medical advice  Call your healthcare provider right away if any of these occur:   Spreading areas of itching, redness, or swelling   Pain or swelling that gets worse   Fever of 100.31F (38C) or higher, or as directed by your healthcare provider   Drainage from  the wound   You get a skin sore (skin ulcer)   A red streak in the skin leading away from the wound   You still have symptoms after 3 days   Generalized rash, fever, or joint pain starting 1 to 2 weeks after treatment  Call 911  Call 911 if any of these occur:   New or worse swelling in the face, eyelids, lips, mouth, throat, or tongue   Hard time swallowing or breathing   Dizziness, weakness, or fainting  StayWell last reviewed this educational content on 02/23/2018   2000-2020 [employer], [place]. [address] [place], [place] [address]. All rights reserved. This information is not intended as a substitute for professional medical care. Always follow your healthcare professional's instructions.        Understanding Contact Dermatitis    Contact dermatitis is a common type of skin rash. It's caused by something that touches the skin and makes it irritated and inflamed. It can occur on skin on any part of the body, such as the face, neck, hands, arms, and legs. Contact dermatitis is not spread from person to person. Often, the reaction of contact dermatitis occurs 1 to 2 days after contact with the offending agent.   How to say it  COHN-tact der-muh-TY-tis  What causes contact dermatitis?  It's caused by something that irritates the skin, or that  creates an allergic reaction on the skin. People can get contact dermatitis from many kinds of things. These include:    Plant oils in poison ivy, oak, and sumac   Chemicals in household cleaners, solvents, and glue   Chemicals in makeup, soap, laundry detergent, perfume, acne cream, and hair products   Certain medicines, such as neomycin, bacitracin, benzocaine, and thimerosal   Metals such as nickel, found in some jewelry and watch bands   The sticky material on the back of bandages and tape (adhesive)   Things that can cause tiny breaks in the skin, such as wood, fiberglass, metal tools, and plant thorns   Rubber latex in surgical gloves and  other medical supplies  Dermatitis can also be caused by the skin being damp for long periods of time. This can happen from washing your hands too often, or working with wet materials.   Symptoms of contact dermatitis  Symptoms can include skin that is:   Blistered   Burning   Cracked   Dry   Itchy   Painful   Red   Rough, thickened, and leathery   Swollen   Warm  The blisters may ooze fluid and form crusts.  Treatment for contact dermatitis  Treatment is done to help relieve itching and reduce inflammation. The rash should go away in a few days to a few weeks. Treatments include:    Cool, moist compress. Use a clean damp cloth. Put it on the area for 20 to 30 minutes, 5 to 6 times a day for the first 3 days.   Steroid cream or ointment. You can apply this medicine several times a day on clean skin.   Oral corticosteroid. Your healthcare provider may prescribe this medicine if you have severe skin symptoms on a large part of your body. Your healthcare provider may give you a steroid injection instead of pills.   Oral antihistamine. This medicine can help reduce itching.   Colloidal oatmeal bath. Soaking in water with colloidal oatmeal can help soothe skin.   Plain cream, lotion, or ointment. Cream, lotion, or ointment without medicine can help to soothe and protect your skin.  Living with contact dermatitis  Talk with your healthcare provider about what may have caused your contact dermatitis. Patch testing may help you figure out what caused the rash so you can avoid further contact with it. Once you learn what caused your rash, make sure to avoid that substance. If your skin comes into contact with it again, make sure to wash your skin right away. If you can't avoid the substance, wear gloves or other protective clothing before you touch it. Wash the clothing you were wearing. This is because the substance you are allergic to may stay on them until it's washed off. Pets can also pass on allergens  such as poison ivy from the plant to your skin. Bathe pets if you suspect they have been in contact. Or use a cream, lotion, or ointment to protect your skin.   When to call your healthcare provider  Call your healthcare provider right away if you have any of these:   Fever of 100.8F (38C) or higher, or as directed by your healthcare provider   Symptoms that don't get better, or get worse   New symptoms  StayWell last reviewed this educational content on 09/23/2017   2000-2020 [employer], [place]. [address], [place], [place] [address]. All rights reserved. This information is not intended as a substitute for  professional medical care. Always follow your healthcare professional's instructions.

## 2018-12-09 NOTE — Progress Notes (Signed)
[facility]  PROGRESS NOTE     Patient: Ann Patterson   Date of Service: 12/09/2018   MRN: [id number]       Ann Patterson is a 68 y.o. female      HISTORY     Chief Complaint   Patient presents with   . Insect Bite     c/o bug bite in face x2 days.  her left eye is completly closed and right eye is almost completly closed.            Insect Bite   Allergic Reaction: Associated symptoms include a rash. Pertinent negatives include no eye redness or trouble swallowing.   Associated symptoms: rash    Associated symptoms: no fever      68yo female was removing vines and plants near a shed 2 days ago, knows for sure there wasn't any poison ivy or poison oak, and that what she removed she has removed before and never developed reactions, but there were spiders and states that she must have gotten bit by a spider near her left eye, because it started swelling and becoming irritated. Then developed right eye swelling, crusting/flaking/weeping redness over her cheeks and chin, and now her neck and upper chest/shoulders, then noticed a couple lesions on her hands today. Face not really itchy, neck was itchy. Taking claritin. Was told by her doctor previously to NOT take benadryl or aspirin. Denies SOB.    Review of Systems   Constitutional: Negative for fever.   HENT: Negative for trouble swallowing and voice change.    Eyes: Negative for photophobia, pain, discharge and redness.   Respiratory: Negative for shortness of breath.    Skin: Positive for rash.       History:  Past Medical History:   Diagnosis Date   . Headache    . Hyperlipidemia    . Low back pain    . Meningitis spinal 1956    spinal and "regular   . Neuropathy     upper c spine issues, + carpal tunnel symptoms bilaterally   . Pneumonia 2015   . Polio    . 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 Vertigo        Past Surgical History:   Procedure Laterality Date   . APPENDECTOMY     . BACK SURGERY     . CARPAL TUNNEL RELEASE     . FRACTURE SURGERY     . HYSTERECTOMY      . ORIF, FINGER Left 03/02/2015    Procedure: ORIF, FINGER;  Surgeon: Kellie Simmering, MD;  Location: [facility] OR;  Service: Plastics;  Laterality: Left;  LEFT SMALL FINGER METACARPAL ORIF   . REPAIR, HAND, NERVE  12/10/2012    Procedure: REPAIR, HAND, NERVE;  Surgeon: Kellie Simmering, MD;  Location: [facility];  Service: Plastics;  Laterality: Left;  LEFT SMALL FINGER EXPLORATION; REPAIR DIGITAL NERVE   . REPAIR, LIGAMENT Left 03/02/2015    Procedure: REPAIR, LIGAMENT;  Surgeon: Kellie Simmering, MD;  Location: [facility] OR;  Service: Plastics;  Laterality: Left;  LEFT MIDDLE FINGER RADIAL COLLATERAL LIGAMENT REPAIR       Family History   Problem Relation Age of Onset   . Myocardial Infarction Mother    . Diverticulitis Sister    . Diabetes Maternal Grandmother    . Brain cancer Maternal Grandfather    . Breast cancer Neg Hx        Social History  Tobacco Use   . Smoking status: Current Every Day Smoker     Packs/day: 0.50     Years: 30.00     Pack years: 15.00     Types: Cigarettes   . Smokeless tobacco: Never Used   Substance Use Topics   . Alcohol use: Yes     Comment: social  wine   . Drug use: Yes     Frequency: 2.0 times per week     Types: Marijuana     Comment: uses fro pain rellief  will hold use 02/28/15       History reviewed.        Current Outpatient Medications:   .  betamethasone dipropionate (DIPROLENE) 0.05 % cream, Apply topically 2 (two) times daily, Disp: 30 g, Rfl: 0  .  denosumab (PROLIA) 60 MG/ML Solution Prefilled Syringe subcutaneous injection, Inject 60 mg into the skin once, Disp: , Rfl:   .  fish oil-omega-3 fatty acids 1000 MG capsule, Take 1 g by mouth daily.  , Disp: , Rfl:   .  fluticasone (FLONASE) 50 MCG/ACT nasal spray, APPLY ONE SPRAY IN EACH NOSTRIL EVERY DAY, Disp: , Rfl:   .  pregabalin (LYRICA) 200 MG capsule, Take 1 capsule (200 mg total) by mouth 3 (three) times daily, Disp: 90 capsule, Rfl: 5  .  raloxifene (EVISTA) 60 MG tablet, Take 1 tablet (60 mg total) by mouth  daily., Disp: 30 tablet, Rfl: 3  .  predniSONE (DELTASONE) 20 MG tablet, Take 3 tabs/day x 1 days;2 tabs /day x 3 days,1.5 tab /day x 3 days,1 tab/day x 3 days, 0.5 tab/day x 3 days., Disp: 18 tablet, Rfl: 0    Allergies   Allergen Reactions   . Gadolinium    . Iodine    . Penicillins        Medications and Allergies reviewed.    PHYSICAL EXAM     Vitals:    12/09/18 1255   BP: 110/65   Pulse: 84   Resp: 18   Temp: 98.4 F (36.9 C)   TempSrc: Tympanic   SpO2: 94%   Height: 1.556 m (5' 1.25")       Physical Exam   Constitutional: She is oriented to person, place, and time. She appears well-developed and well-nourished. No distress.   HENT:   Head: Normocephalic and atraumatic. Head is with right periorbital erythema (nontender) and with left periorbital erythema (nontender).   Eyes: Conjunctivae and EOM are normal. Pupils are equal, round, and reactive to light. Right eye exhibits chemosis. Left eye exhibits chemosis.   Neck: Neck supple.   Pulmonary/Chest: Effort normal. No respiratory distress.   Lymphadenopathy:     She has no cervical adenopathy.   Neurological: She is alert and oriented to person, place, and time.   Skin: Skin is warm and dry. Rash noted. She is not diaphoretic.   Bilateral eye diffuse swelling and redness  Face with generalized erythema, flaking, fine erythematous papular rash with some weeping vesicles mostly cheeks/nasolabial folds, chin  Upper neck and shoulder with erythematous patchy papular/vesicular rash  Mildly erythematous vesicular lesions on a couple fingers (left and right hands)     Psychiatric: She has a normal mood and affect.         UCC COURSE       Results     ** No results found for the last 24 hours. **            No results found.  Orders Placed This Encounter   Medications   . predniSONE (DELTASONE) 20 MG tablet     Sig: Take 3 tabs/day x 1 days;2 tabs /day x 3 days,1.5 tab /day x 3 days,1 tab/day x 3 days, 0.5 tab/day x 3 days.     Dispense:  18 tablet     Refill:  0          PROCEDURES     Procedures       ASSESSMENT     Encounter Diagnosis   Name Primary?   . Contact dermatitis, unspecified contact dermatitis type, unspecified trigger Yes          SSESSMENT    PLAN      Appearance and history highly suggestive of plant contact dermatitis, as opposed to insect bite, given diffuse and spreading nature, vesicular appearance   Advised cool compress, cool water, single use of towels/washcloths/tissues   No s/sx secondary infection, but needs close monitoring, advised f/u with PCP in 1-2 days   Prednisone taper as prescribed, continue claritin    Discussed results and diagnosis with patient/family.  Reviewed warning signs for worsening condition, as well as, indications for follow-up with pmd and return to urgent care clinic.   Patient/family expressed understanding of instructions.    No orders of the defined types were placed in this encounter.        An After Visit Summary was printed and given to the patient.      Signed,  Salley Hews, MD

## 2019-01-03 ENCOUNTER — Telehealth (INDEPENDENT_AMBULATORY_CARE_PROVIDER_SITE_OTHER): Payer: Medicare Other | Admitting: Vascular Neurology

## 2019-01-03 ENCOUNTER — Encounter (INDEPENDENT_AMBULATORY_CARE_PROVIDER_SITE_OTHER): Payer: Self-pay | Admitting: Vascular Neurology

## 2019-01-22 ENCOUNTER — Telehealth (INDEPENDENT_AMBULATORY_CARE_PROVIDER_SITE_OTHER): Payer: Medicare Other | Admitting: Vascular Neurology

## 2019-02-17 ENCOUNTER — Other Ambulatory Visit (INDEPENDENT_AMBULATORY_CARE_PROVIDER_SITE_OTHER): Payer: Self-pay | Admitting: Vascular Neurology

## 2019-02-17 DIAGNOSIS — M542 Cervicalgia: Secondary | ICD-10-CM

## 2019-02-17 DIAGNOSIS — M5412 Radiculopathy, cervical region: Secondary | ICD-10-CM

## 2019-02-22 ENCOUNTER — Telehealth: Payer: Medicare Other

## 2019-02-22 ENCOUNTER — Other Ambulatory Visit: Payer: Self-pay | Admitting: Ophthalmology

## 2019-02-22 DIAGNOSIS — Z01818 Encounter for other preprocedural examination: Secondary | ICD-10-CM

## 2019-02-22 NOTE — Pre-Procedure Instructions (Signed)
.   Anesthesia Guidelines:  o COVID    . Surgeon Requirement:  o Med Eval, Labs, EKG and COVID    . Specialist Notes / Test Results / Records Requested:   Med Eval and EKG done on 10/22/2020Dennie Bible from PCP office would be faxing all the records.   Pt was given instructions for Nov 5 and Nov 19 Cataract procedure.       . Future Plan / Upcoming Appts:   o COVID test schedule for Feb 24, 2019- [facility]- Order in [place]  o Covid test schedule for Nov 15,2020- [facility]- order in [place]    . Recent Hospitalization / ED Visit:   o none    . Nav Team / Nav 1 Handoff:  o None    . Email Sent To:   o none    . Other / Misc:   Covid 19 screening questions were completed and negative.    . Labs / Testing at [facility]:  o none

## 2019-02-24 ENCOUNTER — Encounter (INDEPENDENT_AMBULATORY_CARE_PROVIDER_SITE_OTHER): Payer: Medicare Other

## 2019-02-24 ENCOUNTER — Ambulatory Visit (FREE_STANDING_LABORATORY_FACILITY): Payer: Medicare Other

## 2019-02-24 DIAGNOSIS — Z01818 Encounter for other preprocedural examination: Secondary | ICD-10-CM

## 2019-02-24 DIAGNOSIS — Z1159 Encounter for screening for other viral diseases: Secondary | ICD-10-CM

## 2019-02-25 LAB — COVID-19 (SARS-COV-2): SARS CoV 2 Overall Result: NOT DETECTED

## 2019-02-25 NOTE — Pre-Procedure Instructions (Signed)
   surg sched  Notified needs new surg pre-op eye gtt orders (dated 01/21/19)

## 2019-02-26 ENCOUNTER — Other Ambulatory Visit: Payer: Self-pay | Admitting: Ophthalmology

## 2019-02-26 ENCOUNTER — Encounter (INDEPENDENT_AMBULATORY_CARE_PROVIDER_SITE_OTHER): Payer: Medicare Other

## 2019-02-27 MED ORDER — LIDOCAINE HCL 3.5 % OP GEL
OPHTHALMIC | Status: AC
Start: 2019-02-27 — End: ?
  Filled 2019-02-27: qty 1

## 2019-02-27 MED ORDER — PHENYLEPHRINE HCL 2.5 % OP SOLN
OPHTHALMIC | Status: AC
Start: 2019-02-27 — End: ?
  Filled 2019-02-27: qty 15

## 2019-02-27 MED ORDER — TROPICAMIDE 1 % OP SOLN
OPHTHALMIC | Status: AC
Start: 2019-02-27 — End: ?
  Filled 2019-02-27: qty 3

## 2019-02-27 MED ORDER — TETRACAINE HCL 0.5 % OP SOLN
OPHTHALMIC | Status: AC
Start: 2019-02-27 — End: ?
  Filled 2019-02-27: qty 4

## 2019-02-28 ENCOUNTER — Encounter
Admission: RE | Disposition: A | Discharge status: Home / Self Care | Payer: Self-pay | Source: Ambulatory Visit | Attending: Ophthalmology

## 2019-02-28 ENCOUNTER — Encounter: Payer: Self-pay | Admitting: Ophthalmology

## 2019-02-28 ENCOUNTER — Ambulatory Visit: Payer: Medicare Other | Admitting: Anesthesiology

## 2019-02-28 ENCOUNTER — Ambulatory Visit
Admission: RE | Admit: 2019-02-28 | Discharge: 2019-02-28 | Disposition: A | Discharge status: Home / Self Care | Payer: Medicare Other | Source: Ambulatory Visit | Attending: Ophthalmology | Admitting: Ophthalmology

## 2019-02-28 DIAGNOSIS — F17210 Nicotine dependence, cigarettes, uncomplicated: Secondary | ICD-10-CM | POA: Insufficient documentation

## 2019-02-28 DIAGNOSIS — E785 Hyperlipidemia, unspecified: Secondary | ICD-10-CM | POA: Insufficient documentation

## 2019-02-28 DIAGNOSIS — H269 Unspecified cataract: Secondary | ICD-10-CM | POA: Insufficient documentation

## 2019-02-28 DIAGNOSIS — Z8612 Personal history of poliomyelitis: Secondary | ICD-10-CM | POA: Insufficient documentation

## 2019-02-28 DIAGNOSIS — G629 Polyneuropathy, unspecified: Secondary | ICD-10-CM | POA: Insufficient documentation

## 2019-02-28 HISTORY — PX: EXTRACTION, CATARACT, PHACO, IOL: SHX4086

## 2019-02-28 SURGERY — EXTRACTION, CATARACT, PHACOEMULSIFICATION, INSERTION INTRAOCULAR LENS (IOL)
Anesthesia: Anesthesia MAC / Sedation | Site: Eye | Laterality: Left | Wound class: Clean

## 2019-02-28 MED ORDER — PROMETHAZINE HCL 25 MG/ML IJ SOLN
6.2500 mg | Freq: Once | INTRAMUSCULAR | Status: DC | PRN
Start: 2019-02-28 — End: 2019-02-28

## 2019-02-28 MED ORDER — MIDAZOLAM HCL 1 MG/ML IJ SOLN (WRAP)
INTRAMUSCULAR | Status: DC | PRN
Start: 2019-02-28 — End: 2019-02-28
  Administered 2019-02-28: 0.5 mg via INTRAVENOUS

## 2019-02-28 MED ORDER — LIDOCAINE HCL (PF) 1 % IJ SOLN
INTRAMUSCULAR | Status: DC | PRN
Start: 2019-02-28 — End: 2019-02-28
  Administered 2019-02-28: 2 mL

## 2019-02-28 MED ORDER — ACETAMINOPHEN 325 MG PO TABS
650.0000 mg | ORAL_TABLET | Freq: Once | ORAL | Status: DC | PRN
Start: 2019-02-28 — End: 2019-02-28

## 2019-02-28 MED ORDER — ONDANSETRON HCL 4 MG/2ML IJ SOLN
INTRAMUSCULAR | Status: DC | PRN
Start: 2019-02-28 — End: 2019-02-28
  Administered 2019-02-28: 4 mg via INTRAVENOUS

## 2019-02-28 MED ORDER — AMMONIA AROMATIC IN INHA
1.0000 | Freq: Once | RESPIRATORY_TRACT | Status: DC | PRN
Start: 2019-02-28 — End: 2019-02-28

## 2019-02-28 MED ORDER — ONDANSETRON HCL 4 MG/2ML IJ SOLN
4.0000 mg | Freq: Once | INTRAMUSCULAR | Status: DC | PRN
Start: 2019-02-28 — End: 2019-02-28

## 2019-02-28 MED ORDER — LIDOCAINE HCL 3.5 % OP GEL
Freq: Once | OPHTHALMIC | Status: AC
Start: 2019-02-28 — End: 2019-02-28

## 2019-02-28 MED ORDER — LACTATED RINGERS IV SOLN
INTRAVENOUS | Status: DC
Start: 2019-02-28 — End: 2019-02-28
  Administered 2019-02-28: 12:00:00 500 mL via INTRAVENOUS

## 2019-02-28 MED ORDER — EPINEPHRINE HCL 1 MG/ML IJ SOLN (WRAP)
Status: DC | PRN
Start: 2019-02-28 — End: 2019-02-28
  Administered 2019-02-28: .3 mg

## 2019-02-28 MED ORDER — BSS IO SOLN
INTRAOCULAR | Status: DC | PRN
Start: 2019-02-28 — End: 2019-02-28
  Administered 2019-02-28: 15 mL
  Administered 2019-02-28: 500 mL

## 2019-02-28 MED ORDER — FENTANYL CITRATE (PF) 50 MCG/ML IJ SOLN (WRAP)
INTRAMUSCULAR | Status: DC | PRN
Start: 2019-02-28 — End: 2019-02-28
  Administered 2019-02-28: 25 ug via INTRAVENOUS

## 2019-02-28 MED ORDER — TETRACAINE HCL 0.5 % OP SOLN
1.0000 [drp] | Freq: Once | OPHTHALMIC | Status: AC
Start: 2019-02-28 — End: 2019-02-28
  Administered 2019-02-28: 11:00:00 1 [drp] via OPHTHALMIC

## 2019-02-28 MED ORDER — TROPICAMIDE 1 % OP SOLN
1.0000 [drp] | OPHTHALMIC | Status: AC
Start: 2019-02-28 — End: 2019-02-28
  Administered 2019-02-28 (×3): 1 [drp] via OPHTHALMIC

## 2019-02-28 MED ORDER — NEOMYCIN-POLYMYXIN-DEXAMETH 3.5-10000-0.1 OP OINT
TOPICAL_OINTMENT | OPHTHALMIC | Status: DC | PRN
Start: 2019-02-28 — End: 2019-02-28
  Administered 2019-02-28: 3.5 g via OPHTHALMIC

## 2019-02-28 MED ORDER — NA HYALUR & NA CHOND-NA HYALUR 0.4-0.35 ML IO KIT
PACK | INTRAOCULAR | Status: DC | PRN
Start: 2019-02-28 — End: 2019-02-28
  Administered 2019-02-28: 1.05 mL via INTRAOCULAR

## 2019-02-28 MED ORDER — LACTATED RINGERS IV SOLN
50.0000 mL/h | INTRAVENOUS | Status: DC
Start: 2019-02-28 — End: 2019-02-28

## 2019-02-28 MED ORDER — PHENYLEPHRINE HCL 2.5 % OP SOLN
1.0000 [drp] | OPHTHALMIC | Status: AC
Start: 2019-02-28 — End: 2019-02-28
  Administered 2019-02-28 (×3): 1 [drp] via OPHTHALMIC

## 2019-02-28 SURGICAL SUPPLY — 42 items
EYE PAD CURITY 1-5/8X2-5/8IN (Dressing) ×1
GLOVE BIOGEL ULTTOUCH M SZ 7.5 (Glove)
GLOVE SRG PLISPRN 7.5 BGL PI ULTRATOUCH (Glove)
GLOVE SURGICAL 7 1/2 BIOGEL PI (Glove) IMPLANT
GLOVE SURGICAL 7 1/2 BIOGEL PI ULTRATOUCH M POWDER FREE BEAD CUFF (Glove) IMPLANT
GOWN SRG POLY XL ASTND LF STRL LVL 4 (Gown) ×1
GOWN SURGICAL XL POLY BLUE LEVEL 4 (Gown) ×1 IMPLANT
GOWN SURGICAL XL POLY BLUE LEVEL 4 IMPERVIOUS REINFORCE SET IN SLEEVE (Gown) ×1 IMPLANT
GOWN X-LRG POLY REINFORCED (Gown) ×1
INTRAOCULAR TCNS TCNS ITEC PROTEC (Lens) ×1 IMPLANT
INTRAOCULAR TECNIS TECNIS ITEC PROTEC TRI-FIX +21.5 DIOPTER MODIFY C (Lens) IMPLANT
IOL TECNIS ASPHERIC 21.5D (Lens) ×1 IMPLANT
KNIFE CLRCUT S SLIT SB 2.4MM (Blade) ×1
KNIFE INTRPD I/A ANG 0.3MM (Opthamology Supply) ×1
KNIFE OPHTH DUAL BVL ANGLD 1MM (Disposable Instruments) ×1
KNIFE OPHTHALMIC W1 MM ANGLE FULL HANDLE (Disposable Instruments) ×1 IMPLANT
KNIFE OPHTHALMIC W1 MM ANGLE FULL HANDLE DUAL BEVEL SIDEPORT CLEARCUT (Disposable Instruments) ×1 IMPLANT
KNIFE OPHTHALMIC W2.4 MM ANGLE 1 BEVEL (Blade) ×1 IMPLANT
KNIFE OPHTHALMIC W2.4 MM ANGLE 1 BEVEL SLIT CLEARCUT INTREPID (Blade) ×1 IMPLANT
KNIFE OPTH ANG CLRCT INTREPID 2.4MM 1 (Blade) ×1
KNIFE OPTH PLMR CNCL ANG INTREPID .3MM (Opthamology Supply) ×1
KNIFE OPTH SS PLYCRB ANG FULL HNDL CLRCT (Disposable Instruments) ×1
LENS IOL HDRPHB ACRL +21.5 DIOP MOD C (Lens) ×1 IMPLANT
PACK CATARACT - 08 (Pack) ×2 IMPLANT
PACK CATARACT-08 AS13353-13 (Pack) ×1 IMPLANT
PACK CENT FMS W/ SLV 45D 0.9MM (Pack) ×2 IMPLANT
PACK PHACOEMULSIFICATION ABS TIP FLUID MANAGEMENT SYSTEM ULTRA SLEEVE (Pack) ×1 IMPLANT
PAD EYE 2 5/8INX2 1/8IN STRL (Dressing) ×1
PAD EYE L2 5/8 IN X W2 1/8 IN (Dressing) IMPLANT
PROTECTOR EYE EYEGARD LF TRNS WTPRF SL (Procedure Accessories) ×1
PROTECTOR EYE TRANSPARENT WATERPROOF (Procedure Accessories) ×1 IMPLANT
PROTECTOR EYE TRANSPARENT WATERPROOF SEAL CROSS CONTAMINATE TAPE (Procedure Accessories) IMPLANT
SHIELD EYEGUARD SURG ADULT (Procedure Accessories) ×1
SOLUTION BSS IRRIG 500ML (Irrigation Solutions) ×1
SOLUTION IRR 500ML BSS IO (Irrigation Solutions) ×1
SOLUTION IRRIGATION 500 ML BSS (Irrigation Solutions) ×1 IMPLANT
SOLUTION IRRIGATION 500 ML BSS INTRAOCULAR (Irrigation Solutions) ×1 IMPLANT
TIP OPHTHALMIC INTREPID IRRIGATION (Opthamology Supply) ×1 IMPLANT
TIP OPHTHALMIC INTREPID IRRIGATION ASPIRATION CONICAL ANGLE L.3 MM (Opthamology Supply) ×1 IMPLANT
WATER STERILE PLASTIC POUR BOTTLE 250 ML (Irrigation Solutions) ×1 IMPLANT
WATER STRL  250ML LF PLS PR BTL (Irrigation Solutions) ×1
WATER STRL IRRIG 250ML BTL (Irrigation Solutions) ×1

## 2019-02-28 NOTE — Discharge Instr - AVS First Page (Signed)
General Information for Discharge Instructions  Cataract with Topical Anesthesia     Do not drive a car or operate machinery for 24 hours   Do not consume alcohol, tranquilizers or sleeping medications for 24 hours unless approved by surgeon   Do not make important decisions or sign any important papers within 24 hours   Stay in the company of a responsible person tonight    Activity     Rest for 24 hours   Do not touch or rub eye   Do not bend down   Do not strain or lift heavy objects   May walk and go about normal activities   Avoid getting water in the eye   You may be up and about; TV, writing and reading are permitted Treatment     Leave /Replace Patch on operative eye remove only to place eye drops. After placing eye drops put the eye patch back on.    Sleep on 1 or 2 pillows   Sleep on non-operative eye side or on your back   Place shield over eye at bedtime   Use regular glasses or sunglasses when outside or in a crowded room   Take post-op kit to your post-op appointment tomorrow, including all eye drops.     Medications (Start Today)    No aspirin or aspirin containing products until advised by surgeon  Start Today    Pred forte, ocuflox and ketrolac one drop each 4 times a day. Please call if any questions. [phone]    Other     May take Tylenol as needed for pain   Resume normal diet       Follow-up appointment:     Tomorrow    Dr. Wesam Silk Office [phone]    Anesthesia: After Your Surgery  You've just had surgery. During surgery, you received medication called anesthesia to keep you comfortable and pain-free. After surgery, you may experience some pain or nausea. This is normal. Here are some tips for feeling better and recovering after surgery.     Stay on schedule with your medication.   Going Home  Your doctor or nurse will show you how to take care of yourself when you go home. He or she will also answer your questions. Have an adult family member or friend drive you  home. For the first 24 hours after your surgery:   Do not drive or use heavy equipment.   Do not make important decisions or sign documents.   Avoid alcohol.   Have someone stay with you, if possible. They can watch for problems and help keep you safe.  Be sure to keep all follow-up doctor's appointments. And rest after your procedure for as long as your doctor tells you to.    Coping with Pain  If you have pain after surgery, pain medication will help you feel better. Take it as directed, before pain becomes severe. Also, ask your doctor or pharmacist about other ways to control pain, such as with heat, ice, and relaxation. And follow any other instructions your surgeon or nurse gives you.    Tips for Taking Pain Medication  To get the best relief possible, remember these points:   Pain medications can upset your stomach. Taking them with a little food may help.   Most pain relievers taken by mouth need at least 20 to 30 minutes to take effect.   Taking medication on a schedule can help you remember to take it. Try to   time your medication so that you can take it before beginning an activity, such as dressing, walking, or sitting down for dinner.   Constipation is a common side effect of pain medications. Drink lots of fluids. Eating fruit and vegetables can also help. Don't take laxatives unless your surgeon has prescribed them.   Mixing alcohol and pain medication can cause dizziness and slow your breathing. It can even be fatal. Don't drink alcohol while taking pain medication.   Pain medication can slow your reflexes. Don't drive or operate machinery while taking pain medication,    Managing Nausea  Some people have an upset stomach after surgery. This is often due to anesthesia, pain, pain medications, or the stress of surgery. The following tips will help you manage nausea and get good nutrition as you recover. If you were on a special diet before surgery, ask your doctor if you should follow it  during recovery. These tips may help:   Don't push yourself to eat. Your body will tell you what to eat and when.   Start off with liquids and soup. They are easier to digest.   Progress to semisolids (mashed potatoes, applesauce, and gelatin) as you feel ready.   Slowly move to solid foods. Don't eat fatty, rich, or spicy foods at first.   Don't force yourself to have three large meals a day. Instead, eat smaller amounts more often.   Take pain medications with a small amount of solid food, such as crackers or toast.    Call Your Surgeon If.   You still have pain an hour after taking medication (it may not be strong enough).   You feel too sleepy, dizzy, or groggy (medication may be too strong).   You have side effects like nausea, vomiting, or skin changes (rash, itching, or hives).   Signs of infection - fever over 101, chills. Incision site redness, warmth, swelling, foul odor or purulent drainage.   Persistent bleeding at the operative site.    2000-2011 [employer], [address], [place] [address]. All rights reserved. This information is not intended as a substitute for professional medical care. Always follow your healthcare professional's instructions.

## 2019-02-28 NOTE — Anesthesia Preprocedure Evaluation (Signed)
Anesthesia Evaluation    AIRWAY    Mallampati: II    TM distance: >3 FB  Neck ROM: full  Mouth Opening:full   CARDIOVASCULAR    cardiovascular exam normal, regular, normal and murmur       DENTAL    no notable dental hx     PULMONARY    pulmonary exam normal and clear to auscultation     OTHER FINDINGS                  Relevant Problems   No relevant active problems               Anesthesia Plan    ASA 2     MAC                     intravenous induction   Detailed anesthesia plan: MAC  Monitors/Adjuncts: other    Post Op: other    Post op pain management: per surgeon    informed consent obtained    Plan discussed with CRNA.            ===============================================================  Inpatient Anesthesia Evaluation    Patient Name: Ann Patterson, Ann Patterson  Surgeon: Belinda Block, MD  Patient Age / Sex: 68 y.o. / female    Medical History:     Past Medical History:   Diagnosis Date   . Headache    . Hyperlipidemia    . Low back pain    . Meningitis spinal 1956    spinal and "regular   . Neuropathy     upper c spine issues, + carpal tunnel symptoms bilaterally   . Pain    . Pneumonia 2015   . Polio    . 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 Vertigo        Past Surgical History:   Procedure Laterality Date   . APPENDECTOMY      68 years of age   . BACK SURGERY  2005    plate in her neck-   . CARPAL TUNNEL RELEASE      bilateral hands 1994 and 1996   . FRACTURE SURGERY Left     at 68 years of age   . HYSTERECTOMY      1995   . ORIF, FINGER Left 03/02/2015    Procedure: ORIF, FINGER;  Surgeon: Kellie Simmering, MD;  Location: [facility] OR;  Service: Plastics;  Laterality: Left;  LEFT SMALL FINGER METACARPAL ORIF   . REPAIR, HAND, NERVE  12/10/2012    Procedure: REPAIR, HAND, NERVE;  Surgeon: Kellie Simmering, MD;  Location: [facility];  Service: Plastics;  Laterality: Left;  LEFT SMALL FINGER EXPLORATION; REPAIR DIGITAL NERVE   . REPAIR, LIGAMENT Left 03/02/2015    Procedure: REPAIR, LIGAMENT;  Surgeon: Kellie Simmering, MD;   Location: [facility] OR;  Service: Plastics;  Laterality: Left;  LEFT MIDDLE FINGER RADIAL COLLATERAL LIGAMENT REPAIR         Allergies:     Allergies   Allergen Reactions   . Gadolinium      Heart fluttering   . Iodine      Heart fluttering   . Penicillins Hives         Medications:     Current Facility-Administered Medications   Medication Dose Route Frequency Last Rate Last Admin   . lactated ringers infusion   Intravenous Continuous       . lidocaine (AKTEN) 3.5 % ophthalmic gel   Left Eye  Once       . phenylephrine (MYDFRIN) 2.5 % ophthalmic solution 1 drop  1 drop Left Eye Q5 Min       . tetracaine (PONTOCAINE) 0.5 % ophthalmic solution 1 drop  1 drop Both Eyes Once       . tropicamide (MYDRIACYL) 1 % ophthalmic solution 1 drop  1 drop Left Eye Q5 Min                  Prior to Admission medications    Medication Sig Start Date End Date Taking? Authorizing Provider   Ascorbic Acid (VITAMIN C PO) Take 1 tablet by mouth   Yes [provider]   betamethasone dipropionate (DIPROLENE) 0.05 % cream Apply topically 2 (two) times daily  Patient taking differently: Apply topically as needed    12/26/17  Yes Maple Mirza, PA   denosumab (PROLIA) 60 MG/ML Solution Prefilled Syringe subcutaneous injection Inject 60 mg into the skin once Twice a year     Yes [provider]   fluticasone (FLONASE) 50 MCG/ACT nasal spray APPLY ONE SPRAY IN EACH NOSTRIL EVERY DAY 11/09/18  Yes [provider]   Lyrica 200 MG capsule TAKE ONE CAPSULE BY MOUTH THREE TIMES A DAY 02/17/19  Yes Campbell Lerner, MD   raloxifene (EVISTA) 60 MG tablet Take 1 tablet (60 mg total) by mouth daily. 10/10/14  Yes Campbell Lerner, MD   fish oil-omega-3 fatty acids 1000 MG capsule Take 1 g by mouth daily.      [provider]     Vitals        Wt Readings from Last 3 Encounters:   02/22/19 (!) 40.4 kg (89 lb)   01/03/19 (!) 42.2 kg (93 lb)   08/15/18 (!) 42.2 kg (93 lb)     BMI (Estimated body mass index is 16.82 kg/m as  calculated from the following:    Height as of this encounter: 1.549 m (5\' 1" ).    Weight as of this encounter: 40.4 kg (89 lb).)  Temp Readings from Last 3 Encounters:   12/09/18 36.9 C (98.4 F) (Tympanic)   03/18/18 36.7 C (98 F) (Oral)   12/25/17 36.7 C (98 F) (Temporal Artery)     BP Readings from Last 3 Encounters:   12/09/18 110/65   04/05/18 101/59   03/18/18 125/75     Pulse Readings from Last 3 Encounters:   12/09/18 84   04/05/18 71   03/18/18 70           Labs:   CBC:  Lab Results   Component Value Date    WBC 9.24 03/18/2018    HGB 12.2 03/18/2018    HCT 36.1 03/18/2018    PLT 206 03/18/2018       Chemistries:  Lab Results   Component Value Date    NA 141 03/18/2018    K 3.7 03/18/2018    CL 106 03/18/2018    CO2 22 03/18/2018    BUN 10.0 03/18/2018    CREAT 0.8 03/18/2018    GLU 93 03/18/2018    CA 8.9 03/18/2018    AST 44 (H) 07/19/2013       Coags:  Lab Results   Component Value Date    PT 13.3 02/27/2015    PTT 30 02/27/2015    INR 1.0 02/27/2015     _____________________      Signed by: Florestine Avers  02/28/19   11:15 AM    =============================================================  Signed by: Florestine Avers 02/28/19 11:14 AM

## 2019-02-28 NOTE — H&P (Signed)
Mature cataract. Patient cannot see to drive or read. Vision is 20/40 or worse. Heart and lungs are clear. Ok for cataract surgery.           Ann Patterson is an 68 y.o. female.    Past Medical History:   Diagnosis Date    Headache     Hyperlipidemia     Low back pain     Meningitis spinal 1956    spinal and "regular    Neuropathy     upper c spine issues, + carpal tunnel symptoms bilaterally    Pain     Pneumonia 2015    Polio     Polio 1956    Vertigo        Allergies:   Allergies   Allergen Reactions    Gadolinium      Heart fluttering    Iodine      Heart fluttering    Penicillins Hives       Active Problems:    * No active hospital problems. *    Blood pressure 99/58, pulse 65, temperature 97.1 F (36.2 C), temperature source Temporal, resp. rate 18, height 1.549 m (5\' 1" ), weight (!) 39.9 kg (88 lb), SpO2 96 %.    Review of Systems  Respiratory: Negative.  Cardiovascular: Negative.    Assessment:  Ok for surgery.    Roney Marion Loy Mccartt  02/28/2019

## 2019-02-28 NOTE — Transfer of Care (Signed)
Anesthesia Transfer of Care Note    Patient: Ann Patterson    Procedures performed: Procedure(s) with comments:  EXTRACTION, CATARACT, PHACO, IOL - LEFT  EYE PHACO WITH IOL    Anesthesia type: MAC    Patient location:Phase II PACU    Last vitals:   Vitals:    02/28/19 1237   BP:    Pulse: 68   Resp: 12   Temp: 36.4 C (97.6 F)   SpO2: 98%       Post pain: Patient not complaining of pain, continue current therapy      Mental Status:awake    Respiratory Function: tolerating room air    Cardiovascular: stable    Nausea/Vomiting: patient not complaining of nausea or vomiting    Hydration Status: adequate    Post assessment: no apparent anesthetic complications    Signed by: Windy Fast Megha Agnes  02/28/19 12:37 PM

## 2019-02-28 NOTE — Anesthesia Postprocedure Evaluation (Signed)
Anesthesia Post Evaluation    Patient: Ann Patterson    Procedure(s) with comments:  EXTRACTION, CATARACT, PHACO, IOL - LEFT  EYE PHACO WITH IOL    Anesthesia type: MAC    Last Vitals:   Vitals Value Taken Time   BP 102/59 02/28/19 1250   Temp 36.7 C (98 F) 02/28/19 1250   Pulse 68 02/28/19 1250   Resp 18 02/28/19 1250   SpO2 96 % 02/28/19 1250       Anesthesia Post Evaluation:     Patient Evaluated: PACU  Patient Participation: complete - patient participated  Level of Consciousness: awake and alert  Pain Score: 3  Pain Management: adequate    Airway Patency: patent    Anesthetic complications: No      PONV Status: none    Cardiovascular status: acceptable  Respiratory status: acceptable  Hydration status: acceptable        Anesthesia Qualified Clinical Data Registry 2018    PACU Reintubation  Did the Patient have general anesthesia with intubation: No        PONV Adult  Is the patient aged 65 or older: Yes  Did the patient receive recieve a general anesthestic: No          PONV Pediatric  Is the patient aged 33-17? No            PACU Transfer Checklist Protocol  Was the patient transferred to the PACU at the conclusion of surgery? Yes  Was a checklist or transfer protocol used? Yes    ICU Transfer Checklist Protocol  Was the patient transferred to the ICU at the conclusion of surgery? No      Post-op Pain Assessment Prior to Anesthesia Care End  Age >=18 and assessed for pain in PACU: Yes  Pacu pain score <7/10: Yes      Perioperative Mortality  Perioperative mortality prior to Anesthesia end time: No    Perioperative Cardiac Arrest  Did the patient have an unanticipated intraoperative cardiac arrest between anesthesia start time and anesthesia end time? No    Unplanned Admission to ICU  Did the patient have an unplanned admission to the ICU (not initially anticipated at anesthesia start time)? No      Signed by: Florestine Avers, 02/28/2019 1:14 PM

## 2019-02-28 NOTE — Op Note (Signed)
PREOPERATIVE DIAGNOSES:  Cataract of the left eye.    POSTOPERATIVE DIAGNOSES:  Cataract of left eye.    PROCEDURE PERFORMED:  Phacoemulsification with Intra-ocular lens placement.    ANESTHESIA:  LMAC.    ESTIMATED BLOOD LOSS:  None.    DESCRIPTION OF PROCEDURE:  The patient was evaluated in the holding area.  Proper site, H  and P, and the consent were all reviewed.  The patient was then  brought back to the operating room, placed under monitored  anesthetic care.  Surgical pause was taken to identify the  correct patient, surgical site, and the proper lens power.  The  patient was prepped and draped in a sterile fashion.  A lid  speculum was placed in the left eye.  Paracentesis site was  created inferiorly, followed by injection of intracameral  lidocaine and Viscoat.  A temporal clear corneal incision was  created.  Then, using a cystotome needle, a capsulorrhexis was  created and completed with Utrata forceps.  Hydrodissection and  hydrodelineation were performed.  The lens was phacoemulsified.  I and A was used to remove the remaining cortical material.  The  capsular bag was reinflated with Provisc, and then a PCBOO, 21.5  diopter lens was injected into the capsular bag.  I and A was  used to remove the remaining Provisc material and then the wounds  underwent stromal hydration.  They were checked for any leakage  or seepage, none was noted.  A drop of Pred Forte, Zymaxid, and  Acular were placed on the surface of the eye.  The patient left  the operating room under stable condition with no complications.

## 2019-03-05 ENCOUNTER — Telehealth (INDEPENDENT_AMBULATORY_CARE_PROVIDER_SITE_OTHER): Payer: Medicare Other | Admitting: Vascular Neurology

## 2019-03-05 DIAGNOSIS — M5412 Radiculopathy, cervical region: Secondary | ICD-10-CM

## 2019-03-05 DIAGNOSIS — G5603 Carpal tunnel syndrome, bilateral upper limbs: Secondary | ICD-10-CM

## 2019-03-05 DIAGNOSIS — Z79899 Other long term (current) drug therapy: Secondary | ICD-10-CM

## 2019-03-05 DIAGNOSIS — F17200 Nicotine dependence, unspecified, uncomplicated: Secondary | ICD-10-CM

## 2019-03-05 DIAGNOSIS — M542 Cervicalgia: Secondary | ICD-10-CM

## 2019-03-05 NOTE — Progress Notes (Signed)
TELEMEDICINE APPOINTMENT    Subjective:      Verbal consent was obtained from the patient for this telemedicine appointment, secondary to ongoing concerns regarding the COVID-19 pandemic.    Patient ID: Ann Patterson  CC: Neck pain    HPI  The patient recently had left cataract surgery and is going to be having surgery for the right cataract in the near future.  From a neurologic standpoint she has been stable.  Her neck is generally doing well, and she says that the swelling is not that bad.  Occasionally she feels a pressure in her neck and will put pressure on a pressure point and then it releases.    She has cut down on tobacco use but still smokes regularly.  She says "it is the only thing I have left to enjoy", and was not particularly interested in quitting completely.    The following portions of the patient's history were reviewed and updated as appropriate: allergies, current medications, past family history, past medical history, past social history, past surgical history and problem list.     Review of Systems    Constitutional: Negative for fever.   Respiratory: Negative for shortness of breath.    Cardiovascular: Negative for chest pain.   Gastrointestinal: Negative for abdominal pain.   Neurological: Negative for dizziness, weakness, numbness and headaches.   Psychiatric/Behavioral: Negative for sleep disturbance.   All other systems reviewed and are negative except pertinent positives as noted above in HPI.         Objective:     Vitals: Reviewed. Respiratory rate appears normal.    General: WDWN, NAD. Vital signs reviewed.  HEENT: No nasal discharges, no obvious oral masses. No icterus or conjunctival hemorrhage noted.  Neck: Full ROM. No neck masses noted.  Extremities: No edema noted in the hands.   Skin: No obvious rashes or discoloration.  Chest:  No audible wheezing or dyspnea at rest.  Abdomen: No distention, no tenderness or guarding to patient palpation.  Psychiatric: Cooperative  with exam, normal affect. Thought process and speech pattern normal.    NEUROLOGICAL EXAM    Mental Status: Awake, alert, oriented. Speech fluent without dysarthria. Follows commands well. Attention, memory, and concentration intact. Fund of knowledge appropriate for education.     Cranial Nerves: Left eye is patched.  Extraocular movements are full. No nystagmus seen. Facial sensation to light touch appears intact. Face is symmetric. Hearing is intact to conversational speech. Tongue protrudes midline. Cranial nerves II-XII otherwise appears intact.     Motor: Moves all extremities well, without UE drift. No obvious focal weakness noted. Bulk appears normal. No tremor noted.    Sensation: Light touch intact.     Coordination: No obvious dysmetria.     Gait: Normal and steady.       Assessment:       1. Neck pain    2. Cervical radiculopathy    3. Carpal tunnel syndrome on both sides    4. Medication management    5. Current every day smoker          Plan:      1.  Continue Lyrica 200 mg 3 times a day.  2.  I counseled the patient on smoking cessation, but she did not want to completely quit at this time.  3 minutes spent counseling.  3.  Follow-up in 6 months.      The patient will return for follow-up as outlined above. If there are any  problems with medications (if prescribed) or questions about any available test results, or if there are any new symptoms or changes in current symptoms, the patient is instructed to call the office.         Ann Braun B. Stacy Gardner, MD  Director, [facility]  Co-Director, [facility] - [facility], Neurology  Board Certified, Sleep Medicine  Board Certified, Clinical Neurophysiology  Board Certified, Vascular Neurology        *This note was generated by the Epic EMR system/ Dragon speech recognition and may contain inherent errors or omissions not intended by the user. Grammatical errors, random word insertions, deletions,  pronoun errors and incomplete sentences are occasional consequences of this technology due to software limitations. Not all errors are caught or corrected. If there are questions or concerns about the content of this note or information contained within the body of this dictation they should be addressed directly with the author for clarification.*

## 2019-03-10 ENCOUNTER — Ambulatory Visit (INDEPENDENT_AMBULATORY_CARE_PROVIDER_SITE_OTHER): Payer: Medicare Other

## 2019-03-10 DIAGNOSIS — Z01818 Encounter for other preprocedural examination: Secondary | ICD-10-CM

## 2019-03-10 MED ORDER — PREGABALIN 200 MG PO CAPS
200.00 mg | ORAL_CAPSULE | Freq: Three times a day (TID) | ORAL | 5 refills | Status: DC
Start: 2019-03-10 — End: 2019-07-22

## 2019-03-12 ENCOUNTER — Ambulatory Visit (FREE_STANDING_LABORATORY_FACILITY): Payer: Medicare Other

## 2019-03-12 DIAGNOSIS — Z1159 Encounter for screening for other viral diseases: Secondary | ICD-10-CM

## 2019-03-12 DIAGNOSIS — Z01818 Encounter for other preprocedural examination: Secondary | ICD-10-CM

## 2019-03-13 ENCOUNTER — Telehealth (INDEPENDENT_AMBULATORY_CARE_PROVIDER_SITE_OTHER): Payer: Self-pay

## 2019-03-13 LAB — COVID-19 (SARS-COV-2): SARS CoV 2 Overall Result: NOT DETECTED

## 2019-03-13 MED ORDER — TROPICAMIDE 1 % OP SOLN
OPHTHALMIC | Status: AC
Start: 2019-03-13 — End: ?
  Filled 2019-03-13: qty 3

## 2019-03-13 MED ORDER — TETRACAINE HCL 0.5 % OP SOLN
OPHTHALMIC | Status: AC
Start: 2019-03-13 — End: ?
  Filled 2019-03-13: qty 4

## 2019-03-13 MED ORDER — LIDOCAINE HCL 3.5 % OP GEL
OPHTHALMIC | Status: AC
Start: 2019-03-13 — End: ?
  Filled 2019-03-13: qty 1

## 2019-03-13 MED ORDER — PHENYLEPHRINE HCL 2.5 % OP SOLN
OPHTHALMIC | Status: AC
Start: 2019-03-13 — End: ?
  Filled 2019-03-13: qty 15

## 2019-03-13 NOTE — Telephone Encounter (Signed)
COVID-19 Test Results Call Center    Received call from Surgical Coordinator [place], at [facility], [phone],[id number] COVID-19 test results.    Verified patient's name, DOB and address.    Reviewed chart and provided COVID-19 test result for 03/13/19.Marland Kitchen    Marigene Ehlers, LPN  [id number] Notification Team  Direct Line: [phone]  COVID-19 Test Results Call Center: [phone]

## 2019-03-13 NOTE — Pre-Procedure Instructions (Signed)
   Attempted CC to pt.  Left VM with short DB message and [phone] call back.

## 2019-03-13 NOTE — Anesthesia Preprocedure Evaluation (Addendum)
Anesthesia Evaluation    AIRWAY    Mallampati: II    TM distance: >3 FB  Neck ROM: full  Mouth Opening:full   CARDIOVASCULAR    cardiovascular exam normal       DENTAL         PULMONARY    pulmonary exam normal     OTHER FINDINGS    tob smoker            Relevant Problems   NEURO/PSYCH   (+) History of blurred vision   (+) Hx of fall               Anesthesia Plan    ASA 2     MAC                     intravenous induction           Post op pain management: per surgeon    informed consent obtained                   Signed by: Henreitta Leber 03/13/19 1:34 PM

## 2019-03-14 ENCOUNTER — Encounter: Payer: Self-pay | Admitting: Ophthalmology

## 2019-03-14 ENCOUNTER — Ambulatory Visit
Admission: RE | Admit: 2019-03-14 | Discharge: 2019-03-14 | Disposition: A | Discharge status: Home / Self Care | Payer: Medicare Other | Source: Ambulatory Visit | Attending: Ophthalmology | Admitting: Ophthalmology

## 2019-03-14 ENCOUNTER — Ambulatory Visit: Payer: Medicare Other | Admitting: Anesthesiology

## 2019-03-14 ENCOUNTER — Encounter
Admission: RE | Disposition: A | Discharge status: Home / Self Care | Payer: Self-pay | Source: Ambulatory Visit | Attending: Ophthalmology

## 2019-03-14 DIAGNOSIS — E785 Hyperlipidemia, unspecified: Secondary | ICD-10-CM | POA: Insufficient documentation

## 2019-03-14 DIAGNOSIS — G629 Polyneuropathy, unspecified: Secondary | ICD-10-CM | POA: Insufficient documentation

## 2019-03-14 DIAGNOSIS — J449 Chronic obstructive pulmonary disease, unspecified: Secondary | ICD-10-CM | POA: Insufficient documentation

## 2019-03-14 DIAGNOSIS — F17210 Nicotine dependence, cigarettes, uncomplicated: Secondary | ICD-10-CM | POA: Insufficient documentation

## 2019-03-14 DIAGNOSIS — H259 Unspecified age-related cataract: Secondary | ICD-10-CM | POA: Insufficient documentation

## 2019-03-14 HISTORY — PX: EXTRACTION, CATARACT, PHACO, IOL: SHX4086

## 2019-03-14 SURGERY — EXTRACTION, CATARACT, PHACOEMULSIFICATION, INSERTION INTRAOCULAR LENS (IOL)
Anesthesia: Anesthesia MAC / Sedation | Site: Eye | Laterality: Right | Wound class: Clean

## 2019-03-14 MED ORDER — PHENYLEPHRINE HCL 2.5 % OP SOLN
1.0000 [drp] | OPHTHALMIC | Status: AC
Start: 2019-03-14 — End: 2019-03-14
  Administered 2019-03-14 (×3): 1 [drp] via OPHTHALMIC

## 2019-03-14 MED ORDER — TETRACAINE HCL 0.5 % OP SOLN
1.0000 [drp] | Freq: Once | OPHTHALMIC | Status: AC
Start: 2019-03-14 — End: 2019-03-14
  Administered 2019-03-14: 09:00:00 1 [drp] via OPHTHALMIC

## 2019-03-14 MED ORDER — AMMONIA AROMATIC IN INHA
1.0000 | Freq: Once | RESPIRATORY_TRACT | Status: DC | PRN
Start: 2019-03-14 — End: 2019-03-14

## 2019-03-14 MED ORDER — LIDOCAINE HCL (PF) 1 % IJ SOLN
INTRAMUSCULAR | Status: DC | PRN
Start: 2019-03-14 — End: 2019-03-14
  Administered 2019-03-14: .5 mL

## 2019-03-14 MED ORDER — ONDANSETRON HCL 4 MG/2ML IJ SOLN
4.0000 mg | Freq: Once | INTRAMUSCULAR | Status: DC | PRN
Start: 2019-03-14 — End: 2019-03-14

## 2019-03-14 MED ORDER — NA HYALUR & NA CHOND-NA HYALUR 0.55-0.5 ML IO KIT
PACK | INTRAOCULAR | Status: DC | PRN
Start: 2019-03-14 — End: 2019-03-14
  Administered 2019-03-14: 1.05 mL via INTRAOCULAR

## 2019-03-14 MED ORDER — EPINEPHRINE HCL 1 MG/ML IJ SOLN (WRAP)
Status: DC | PRN
Start: 2019-03-14 — End: 2019-03-14
  Administered 2019-03-14: .3 mg

## 2019-03-14 MED ORDER — ACETAMINOPHEN 325 MG PO TABS
650.0000 mg | ORAL_TABLET | Freq: Once | ORAL | Status: DC | PRN
Start: 2019-03-14 — End: 2019-03-14

## 2019-03-14 MED ORDER — CYCLOPENTOLATE HCL 1 % OP SOLN
1.0000 [drp] | OPHTHALMIC | Status: AC
Start: 2019-03-14 — End: 2019-03-14

## 2019-03-14 MED ORDER — LACTATED RINGERS IV SOLN
INTRAVENOUS | Status: DC
Start: 2019-03-14 — End: 2019-03-14

## 2019-03-14 MED ORDER — LIDOCAINE HCL 3.5 % OP GEL
Freq: Once | OPHTHALMIC | Status: AC
Start: 2019-03-14 — End: 2019-03-14
  Administered 2019-03-14: 1 mL via OPHTHALMIC

## 2019-03-14 MED ORDER — TROPICAMIDE 1 % OP SOLN
1.0000 [drp] | OPHTHALMIC | Status: AC
Start: 2019-03-14 — End: 2019-03-14
  Administered 2019-03-14 (×3): 1 [drp] via OPHTHALMIC

## 2019-03-14 MED ORDER — MIDAZOLAM HCL 1 MG/ML IJ SOLN (WRAP)
INTRAMUSCULAR | Status: DC | PRN
Start: 2019-03-14 — End: 2019-03-14
  Administered 2019-03-14: 0.5 mg via INTRAVENOUS

## 2019-03-14 MED ORDER — LIDOCAINE HCL 3.5 % OP GEL
1.0000 mL | Freq: Once | OPHTHALMIC | Status: DC
Start: 2019-03-14 — End: 2019-03-14

## 2019-03-14 MED ORDER — FENTANYL CITRATE (PF) 50 MCG/ML IJ SOLN (WRAP)
INTRAMUSCULAR | Status: DC | PRN
Start: 2019-03-14 — End: 2019-03-14
  Administered 2019-03-14: 25 ug via INTRAVENOUS

## 2019-03-14 MED ORDER — MIDAZOLAM HCL 1 MG/ML IJ SOLN (WRAP)
INTRAMUSCULAR | Status: AC
Start: 2019-03-14 — End: ?
  Filled 2019-03-14: qty 2

## 2019-03-14 MED ORDER — PREDNISOLONE ACETATE 1 % OP SUSP
OPHTHALMIC | Status: DC | PRN
Start: 2019-03-14 — End: 2019-03-14
  Administered 2019-03-14: 1 [drp] via OPHTHALMIC

## 2019-03-14 MED ORDER — OFLOXACIN 0.3 % OP SOLN
OPHTHALMIC | Status: DC | PRN
Start: 2019-03-14 — End: 2019-03-14
  Administered 2019-03-14: 1 [drp] via OPHTHALMIC

## 2019-03-14 MED ORDER — BSS IO SOLN
INTRAOCULAR | Status: DC | PRN
Start: 2019-03-14 — End: 2019-03-14
  Administered 2019-03-14: 15 mL via OPHTHALMIC
  Administered 2019-03-14: 500 mL via INTRAOCULAR

## 2019-03-14 MED ORDER — FENTANYL CITRATE (PF) 50 MCG/ML IJ SOLN (WRAP)
INTRAMUSCULAR | Status: AC
Start: 2019-03-14 — End: ?
  Filled 2019-03-14: qty 2

## 2019-03-14 SURGICAL SUPPLY — 45 items
CARTRIDGE IOL DMONARCH II "D" (Opthamology Supply) ×1
CARTRIDGE LEN MNRCH III STRL DISP (Opthamology Supply) ×1
CARTRIDGE LENS D CARTRIDGE MONARCH III POLYPROPYLENE POSTERIOR CHAMBER (Opthamology Supply) ×1 IMPLANT
CARTRIDGE LENS MONARCH III (Opthamology Supply) ×1 IMPLANT
GLOVE BIOGEL ULTTOUCH M SZ 7.5 (Glove) ×1
GLOVE SRG PLISPRN 7.5 BGL PI ULTRATOUCH (Glove) ×1
GLOVE SURGICAL 7 1/2 BIOGEL PI (Glove) ×1 IMPLANT
GLOVE SURGICAL 7 1/2 BIOGEL PI ULTRATOUCH M POWDER FREE BEAD CUFF (Glove) IMPLANT
GOWN SRG POLY XL ASTND LF STRL LVL 4 (Gown) ×1
GOWN SURGICAL XL POLY BLUE LEVEL 4 (Gown) ×1 IMPLANT
GOWN SURGICAL XL POLY BLUE LEVEL 4 IMPERVIOUS REINFORCE SET IN SLEEVE (Gown) ×1 IMPLANT
GOWN X-LRG POLY REINFORCED (Gown) ×1
HANDPIECE SUCT/IRR INTREPID TRNSFM (Opthamology Supply) ×1
HANDPIECE SUCTION/IRRIGATION TRANSFORMER (Opthamology Supply) ×1 IMPLANT
HANDPIECE SUCTION/IRRIGATION TRANSFORMER INTREPID (Opthamology Supply) IMPLANT
HANDPIECE TRNSFRMR I/A 0.3MM (Opthamology Supply) ×1
INTRAOCULAR TCNS TCNS ITEC PROTEC (Lens) ×1 IMPLANT
INTRAOCULAR TECNIS TECNIS ITEC PROTEC TRI-FIX +21 DIOPTER MODIFIED C (Lens) IMPLANT
IOL TECNIS ASPHERIC 21.0D (Lens) ×1 IMPLANT
KNIFE CLRCUT S SLIT SB 2.4MM (Blade) ×1
KNIFE INTRPD I/A ANG 0.3MM (Opthamology Supply) ×1
KNIFE OPHTH DUAL BVL ANGLD 1MM (Disposable Instruments) ×1
KNIFE OPHTHALMIC W1 MM ANGLE FULL HANDLE (Disposable Instruments) ×1 IMPLANT
KNIFE OPHTHALMIC W1 MM ANGLE FULL HANDLE DUAL BEVEL SIDEPORT CLEARCUT (Disposable Instruments) ×1 IMPLANT
KNIFE OPHTHALMIC W2.4 MM ANGLE 1 BEVEL (Blade) ×1 IMPLANT
KNIFE OPHTHALMIC W2.4 MM ANGLE 1 BEVEL SLIT CLEARCUT INTREPID (Blade) ×1 IMPLANT
KNIFE OPTH ANG CLRCT INTREPID 2.4MM 1 (Blade) ×1
KNIFE OPTH PLMR CNCL ANG INTREPID .3MM (Opthamology Supply) ×1
KNIFE OPTH SS PLYCRB ANG FULL HNDL CLRCT (Disposable Instruments) ×1
LENS IOL HDRPHB ACRL +21 DIOP MOD C (Lens) ×1 IMPLANT
NEEDLE FLTR PLYCRB 5UM RW BD 19GA 1.5IN (Needles) IMPLANT
NEEDLE NOKOR FILTER 19GX1.5IN (Needles) ×1
NEEDLE SYRING 5.8IN (Needles) ×1
PACK CATARACT - 08 (Pack) ×2 IMPLANT
PACK CATARACT-08 AS13353-13 (Pack) ×1 IMPLANT
PACK CENT FMS W/ SLV 30D 0.9MM (Pack) ×2 IMPLANT
PACK CENT FMS W/ SLV 45D 0.9MM (Pack) ×2 IMPLANT
PACK PHACOEMULSIFICATION ABS TIP FLUID MANAGEMENT SYSTEM ULTRA SLEEVE (Pack) ×1 IMPLANT
PACK PHACOEMULSIFICATION SLEEVE BALANCE TIP INTREPID ABS 30 D BEVEL UP (Pack) IMPLANT
RING STABLEYES CPSLR TNSN 12MM (Ring) ×1 IMPLANT
RING TNSN 12MM LF STRL CPSLR DISP (Ring) IMPLANT
SHIELD EYE ALUMINUM FOX (Dressing) ×1 IMPLANT
SYRINGE MED 1ML PRCSGLD 25GA 5/8IN LF (Needles) IMPLANT
TIP OPHTHALMIC INTREPID IRRIGATION (Opthamology Supply) ×1 IMPLANT
TIP OPHTHALMIC INTREPID IRRIGATION ASPIRATION CONICAL ANGLE L.3 MM (Opthamology Supply) ×1 IMPLANT

## 2019-03-14 NOTE — Transfer of Care (Signed)
Anesthesia Transfer of Care Note    Patient: Ann Patterson    Procedures performed: Procedure(s) with comments:  EXTRACTION, CATARACT, PHACO, IOL - RIGHT  EYE PHACO WITH IOL    Anesthesia type: MAC    Patient location:Phase II PACU    Last vitals: There were no vitals filed for this visit.    Post pain: Patient not complaining of pain, continue current therapy      Mental Status:awake and alert     Respiratory Function: tolerating room air    Cardiovascular: stable    Nausea/Vomiting: patient not complaining of nausea or vomiting    Hydration Status: adequate    Post assessment: no apparent anesthetic complications    Signed by: Henreitta Leber  03/14/19 10:22 AM

## 2019-03-14 NOTE — Anesthesia Postprocedure Evaluation (Signed)
Anesthesia Post Evaluation    Patient: Ann Patterson    Procedure(s) with comments:  EXTRACTION, CATARACT, PHACO, IOL - RIGHT  EYE PHACO WITH IOL    Anesthesia type: MAC    Last Vitals: see pacu rn chart                                Vitals shown include unvalidated device data.    Anesthesia Post Evaluation:     Patient Evaluated: PACU    Level of Consciousness: awake and alert    Pain Management: adequate    Airway Patency: patent    Anesthetic complications: No      PONV Status: none    Cardiovascular status: acceptable  Respiratory status: acceptable  Hydration status: acceptable        Anesthesia Qualified Clinical Data Registry 2018    PACU Reintubation  Did the Patient have general anesthesia with intubation: No        PONV Adult  Is the patient aged 66 or older: Yes  Did the patient receive recieve a general anesthestic: No          PONV Pediatric  Is the patient aged 72-17? No            PACU Transfer Checklist Protocol  Was the patient transferred to the PACU at the conclusion of surgery? Yes  Was a checklist or transfer protocol used? Yes    ICU Transfer Checklist Protocol  Was the patient transferred to the ICU at the conclusion of surgery? No      Post-op Pain Assessment Prior to Anesthesia Care End  Age >=18 and assessed for pain in PACU: Yes  Pacu pain score <7/10: Yes      Perioperative Mortality  Perioperative mortality prior to Anesthesia end time: No    Perioperative Cardiac Arrest  Did the patient have an unanticipated intraoperative cardiac arrest between anesthesia start time and anesthesia end time? No    Unplanned Admission to ICU  Did the patient have an unplanned admission to the ICU (not initially anticipated at anesthesia start time)? No      Signed by: Henreitta Leber, 03/14/2019 10:28 AM

## 2019-03-14 NOTE — Op Note (Signed)
PREOPERATIVE DIAGNOSES:  Cataract of the right eye.    POSTOPERATIVE DIAGNOSES:  Cataract of right eye.    PROCEDURE PERFORMED:  Phacoemulsification with Intra-ocular lens placement.    ANESTHESIA:  LMAC.    ESTIMATED BLOOD LOSS:  None.    DESCRIPTION OF PROCEDURE:  The patient was evaluated in the holding area.  Proper site, H  and P, and the consent were all reviewed.  The patient was then  brought back to the operating room, placed under monitored  anesthetic care.  Surgical pause was taken to identify the  correct patient, surgical site, and the proper lens power.  The  patient was prepped and draped in a sterile fashion.  A lid  speculum was placed in the right eye.  Paracentesis site was  created inferiorly, followed by injection of intracameral  lidocaine and Viscoat.  A temporal clear corneal incision was  created.  Then, using a cystotome needle, a capsulorrhexis was  created and completed with Utrata forceps.  Hydrodissection and  hydrodelineation were performed.  The lens was phacoemulsified.  I and A was used to remove the remaining cortical material.  The  capsular bag was reinflated with Provisc, and then a PCBOO, 21.0  diopter lens was injected into the capsular bag.  I and A was  used to remove the remaining Provisc material and then the wounds  underwent stromal hydration.  They were checked for any leakage  or seepage, none was noted.  A drop of Pred Forte, Zymaxid, and  Acular were placed on the surface of the eye.  The patient left  the operating room under stable condition with no complications.     Patient has pxe. ctr 12.0 placed after iol insertion.

## 2019-03-14 NOTE — Discharge Instr - AVS First Page (Signed)
Reason for your Hospital Admission:  ***      Instructions for after your discharge:  ***    Dr. Marius Ditch Silk-Discharge Instructions    ACTIVITIES AFTER SURGERY:    . You may continue most of your normal activities after surgery and use your eyes as much as you'd like, including such things as watching TV or reading.  Your vision immediately after surgery may or may not be sharp.  Do not worry; as the inflammation in the eye subsides, it should clear.  . For two weeks after surgery, do not engage in physical activities that may injure or swimming.  If you need to lift something, bend at the knees to pick it up.  Do not rub the eye.  . You may be sensitive to light after surgery, especially to the sunlight.  This is to be expected and will resolve with time.  Sunglasses will help with these symptoms.  No harm will occur however, if you do not wear them, they are only for comfort.  . ONLY FOR TORIC OR MULTIFOCAL LENS PATIENTS do not engage in any jarring activities like aerobics or riding the metro as the lens can bounce up and down causing it to rotate.  . Drops to be started the day of surgery four times a day (Morning, Afternoon, Evening, and Before Bed)    Ketorolac,Ocuflox,Pred Forte  Today  03/23/19  have had your morning dose so only Afternoon, evening and Before Bed    Pager: [phone]              Fax: [phone]        (You can also now TEXT our office by texting to [phone])      Anesthesia: After Your Surgery  You've just had surgery. During surgery, you received medication called anesthesia to keep you comfortable and pain-free. After surgery, you may experience some pain or nausea. This is normal. Here are some tips for feeling better and recovering after surgery.     Stay on schedule with your medication.   Going Home  Your doctor or nurse will show you how to take care of yourself when you go home. He or she will also answer your questions. Have an adult family member or friend drive you home.  For the first 24 hours after your surgery:   Do not drive or use heavy equipment.   Do not make important decisions or sign documents.   Avoid alcohol.   Have someone stay with you, if possible. They can watch for problems and help keep you safe.  Be sure to keep all follow-up doctor's appointments. And rest after your procedure for as long as your doctor tells you to.    Coping with Pain  If you have pain after surgery, pain medication will help you feel better. Take it as directed, before pain becomes severe. Also, ask your doctor or pharmacist about other ways to control pain, such as with heat, ice, and relaxation. And follow any other instructions your surgeon or nurse gives you.    Tips for Taking Pain Medication  To get the best relief possible, remember these points:   Pain medications can upset your stomach. Taking them with a little food may help.   Most pain relievers taken by mouth need at least 20 to 30 minutes to take effect.   Taking medication on a schedule can help you remember to take it. Try to time your medication so that you can take it  before beginning an activity, such as dressing, walking, or sitting down for dinner.   Constipation is a common side effect of pain medications. Drink lots of fluids. Eating fruit and vegetables can also help. Don't take laxatives unless your surgeon has prescribed them.   Mixing alcohol and pain medication can cause dizziness and slow your breathing. It can even be fatal. Don't drink alcohol while taking pain medication.   Pain medication can slow your reflexes. Don't drive or operate machinery while taking pain medication,    Managing Nausea  Some people have an upset stomach after surgery. This is often due to anesthesia, pain, pain medications, or the stress of surgery. The following tips will help you manage nausea and get good nutrition as you recover. If you were on a special diet before surgery, ask your doctor if you should follow it during  recovery. These tips may help:   Don't push yourself to eat. Your body will tell you what to eat and when.   Start off with liquids and soup. They are easier to digest.   Progress to semisolids (mashed potatoes, applesauce, and gelatin) as you feel ready.   Slowly move to solid foods. Don't eat fatty, rich, or spicy foods at first.   Don't force yourself to have three large meals a day. Instead, eat smaller amounts more often.   Take pain medications with a small amount of solid food, such as crackers or toast.    Call Your Surgeon If.   You still have pain an hour after taking medication (it may not be strong enough).   You feel too sleepy, dizzy, or groggy (medication may be too strong).   You have side effects like nausea, vomiting, or skin changes (rash, itching, or hives).   Signs of infection - fever over 101, chills. Incision site redness, warmth, swelling, foul odor or purulent drainage.   Persistent bleeding at the operative site.    [address], [address] [place], [place] [address]. All rights reserved. This information is not intended as a substitute for professional medical care. Always follow your healthcare professional's instructions.

## 2019-03-14 NOTE — H&P (Signed)
Mature cataract. Patient cannot see to drive or read. Vision is 20/40 or worse. Heart and lungs are clear. Ok for cataract surgery.           Ann Patterson is an 68 y.o. female.    Past Medical History:   Diagnosis Date    Headache     Hyperlipidemia     Low back pain     Meningitis spinal 1956    spinal and "regular    Neuropathy     upper c spine issues, + carpal tunnel symptoms bilaterally    Pain     Pneumonia 2015    Polio     Polio 1956    Vertigo        Allergies:   Allergies   Allergen Reactions    Gadolinium      Heart fluttering    Iodine      Heart fluttering    Penicillins Hives       Active Problems:    * No active hospital problems. *    There were no vitals taken for this visit.    Review of Systems  Respiratory: Negative.  Cardiovascular: Negative.    Assessment:  Ok for surgery.    [name]  03/14/2019

## 2019-07-16 ENCOUNTER — Other Ambulatory Visit: Payer: Self-pay | Admitting: Obstetrics & Gynecology

## 2019-07-16 DIAGNOSIS — M810 Age-related osteoporosis without current pathological fracture: Secondary | ICD-10-CM

## 2019-07-16 DIAGNOSIS — Z1231 Encounter for screening mammogram for malignant neoplasm of breast: Secondary | ICD-10-CM

## 2019-07-18 ENCOUNTER — Encounter (INDEPENDENT_AMBULATORY_CARE_PROVIDER_SITE_OTHER): Payer: Self-pay

## 2019-07-18 NOTE — Progress Notes (Signed)
ePA has been started via [employer] for Lyrica 200 mg capsules.     Key:  [id number]       Outcome  Approved  Your request has been approved

## 2019-07-19 ENCOUNTER — Other Ambulatory Visit: Payer: Self-pay | Admitting: Obstetrics & Gynecology

## 2019-07-19 ENCOUNTER — Ambulatory Visit
Admission: RE | Admit: 2019-07-19 | Discharge: 2019-07-19 | Disposition: A | Discharge status: Home / Self Care | Payer: Medicare Other | Source: Ambulatory Visit | Attending: Obstetrics & Gynecology | Admitting: Obstetrics & Gynecology

## 2019-07-19 DIAGNOSIS — M810 Age-related osteoporosis without current pathological fracture: Secondary | ICD-10-CM | POA: Insufficient documentation

## 2019-07-19 DIAGNOSIS — Z1231 Encounter for screening mammogram for malignant neoplasm of breast: Secondary | ICD-10-CM | POA: Insufficient documentation

## 2019-07-22 ENCOUNTER — Telehealth (INDEPENDENT_AMBULATORY_CARE_PROVIDER_SITE_OTHER): Payer: Medicare Other | Admitting: Vascular Neurology

## 2019-07-22 ENCOUNTER — Encounter (INDEPENDENT_AMBULATORY_CARE_PROVIDER_SITE_OTHER): Payer: Self-pay | Admitting: Vascular Neurology

## 2019-07-22 VITALS — Ht 61.0 in | Wt 88.0 lb

## 2019-07-22 DIAGNOSIS — M542 Cervicalgia: Secondary | ICD-10-CM

## 2019-07-22 DIAGNOSIS — G5603 Carpal tunnel syndrome, bilateral upper limbs: Secondary | ICD-10-CM

## 2019-07-22 DIAGNOSIS — Z760 Encounter for issue of repeat prescription: Secondary | ICD-10-CM

## 2019-07-22 DIAGNOSIS — M5412 Radiculopathy, cervical region: Secondary | ICD-10-CM

## 2019-07-22 DIAGNOSIS — F17200 Nicotine dependence, unspecified, uncomplicated: Secondary | ICD-10-CM

## 2019-07-22 DIAGNOSIS — F17210 Nicotine dependence, cigarettes, uncomplicated: Secondary | ICD-10-CM

## 2019-07-22 DIAGNOSIS — Z79899 Other long term (current) drug therapy: Secondary | ICD-10-CM

## 2019-07-22 MED ORDER — PREGABALIN 200 MG PO CAPS
200.00 mg | ORAL_CAPSULE | Freq: Three times a day (TID) | ORAL | 5 refills | Status: DC
Start: 2019-07-22 — End: 2019-09-14

## 2019-07-22 NOTE — Progress Notes (Signed)
TELEMEDICINE APPOINTMENT    Subjective:      Verbal consent was obtained from the patient for this telemedicine appointment, secondary to ongoing concerns regarding the COVID-19 pandemic.    Patient ID: Ann Patterson  CC:   Chief Complaint   Patient presents with    Neck Pain       HPI  The patient says that things are going really well.  Since I last saw her she did say that she fell at a restaurant last week and was seen in urgent care, and hurt her left knee.  She is now wearing a stabilizer on the left leg in a boot on the right foot.    She completed her Covid vaccinations and did not have any side effects.    She continues to do well on Lyrica, and feels like that is working well for her.    Results for orders placed or performed during the hospital encounter of 03/17/18   CT Head WO Contrast    Narrative    TECHNIQUE: CT of the head without intravenous contrast.     The following dose reduction techniques were utilized: Automated  exposure control and/or adjustment of the mA and/or kV according to  patient size, and the use of iterative reconstruction technique.    INDICATION: drunk fall     COMPARISON: 10/28/2016.    FINDINGS:     Atherosclerotic calcifications. Mild supratentorial white matter changes  suggestive of chronic microvascular ischemia.    No acute intracranial hemorrhage, mass effect, loss of gray-white matter  differentiation, or evidence of acute ventricular outflow obstruction.     No depressed skull fracture or aggressive appearing osseous lesions.     The visualized paranasal sinuses and temporal bone structures are  well-aerated.       Impression    No acute intracranial abnormality.     Eloise Harman, MD   03/18/2018 12:34 AM         Objective:     Vitals: Reviewed.   Visit Vitals  Ht 1.549 m (5\' 1" )   Wt (!) 39.9 kg (88 lb)   BMI 16.63 kg/m     Respiratory rate appears normal.    General: WDWN, NAD. Vital signs reviewed.  HEENT: No nasal discharges, no obvious oral masses.  No icterus or conjunctival hemorrhage noted.  Neck: Full ROM. No neck masses noted.  Psychiatric: Cooperative with exam, normal affect. Thought process and speech pattern normal.    NEUROLOGICAL EXAM    Mental Status: Awake, alert, oriented. Speech fluent without dysarthria. Follows commands well. Attention, memory, and concentration intact. Fund of knowledge appropriate for education.     Cranial Nerves: Extraocular movements are full. No nystagmus seen. Facial sensation to light touch appears intact. Face is symmetric. Hearing is intact to conversational speech. Tongue protrudes midline. Cranial nerves II-XII otherwise appears intact.     Motor: Moves all extremities well, without UE drift. No obvious focal weakness noted. Bulk appears normal. No tremor noted.    Sensation: Light touch appears intact.     Coordination: No obvious dysmetria.     Gait: Steady      Assessment:     1. Neck pain    2. Cervical radiculopathy    3. Carpal tunnel syndrome on both sides    4. Medication management    5. Prescription refill    6. Current every day smoker        Plan:      1.  Continue Lyrica 200mg  TID.  2. Continue with reduction of smoking, patient was not particularly interested in referral to the smoking cessation program at this time. 3 minutes spent counseling.   3. Follow up in 6 months.      The patient will return for follow-up as outlined above. If there are any problems with medications (if prescribed) or questions about any available test results, or if there are any new symptoms or changes in current symptoms, the patient is instructed to call the office.         Ryne Mctigue B. Stacy Gardner, MD  Director, [facility]  Co-Director, [facility] - [facility], Neurology  Board Certified, Sleep Medicine  Board Certified, Clinical Neurophysiology  Board Certified, Vascular Neurology        *This note was generated by the Epic EMR system/ Dragon speech recognition  and may contain inherent errors or omissions not intended by the user. Grammatical errors, random word insertions, deletions, pronoun errors and incomplete sentences are occasional consequences of this technology due to software limitations. Not all errors are caught or corrected. If there are questions or concerns about the content of this note or information contained within the body of this dictation they should be addressed directly with the author for clarification.*

## 2019-09-12 ENCOUNTER — Other Ambulatory Visit (INDEPENDENT_AMBULATORY_CARE_PROVIDER_SITE_OTHER): Payer: Self-pay | Admitting: Vascular Neurology

## 2019-09-12 DIAGNOSIS — M542 Cervicalgia: Secondary | ICD-10-CM

## 2019-09-12 DIAGNOSIS — M5412 Radiculopathy, cervical region: Secondary | ICD-10-CM

## 2019-11-24 ENCOUNTER — Ambulatory Visit (INDEPENDENT_AMBULATORY_CARE_PROVIDER_SITE_OTHER): Payer: Medicare Other | Admitting: Family Medicine

## 2019-11-24 ENCOUNTER — Encounter (INDEPENDENT_AMBULATORY_CARE_PROVIDER_SITE_OTHER): Payer: Self-pay

## 2019-11-24 VITALS — BP 139/86 | HR 71 | Temp 97.9°F | Resp 17 | Ht 61.25 in | Wt 86.6 lb

## 2019-11-24 DIAGNOSIS — T7840XA Allergy, unspecified, initial encounter: Secondary | ICD-10-CM

## 2019-11-24 MED ORDER — LORATADINE 10 MG PO TABS
10.0000 mg | ORAL_TABLET | Freq: Every day | ORAL | 0 refills | Status: DC
Start: 2019-11-24 — End: 2024-02-20

## 2019-11-24 MED ORDER — DIPHENHYDRAMINE HCL 25 MG PO TABS
25.0000 mg | ORAL_TABLET | Freq: Four times a day (QID) | ORAL | 0 refills | Status: DC | PRN
Start: 2019-11-24 — End: 2020-12-14

## 2019-11-24 MED ORDER — METHYLPREDNISOLONE 4 MG PO TBPK
ORAL_TABLET | ORAL | 0 refills | Status: AC
Start: 2019-11-24 — End: 2019-11-30

## 2019-11-24 MED ORDER — METHYLPREDNISOLONE SODIUM SUCC 40 MG IJ SOLR
40.00 mg | Freq: Once | INTRAMUSCULAR | Status: AC
Start: 2019-11-24 — End: 2019-11-24
  Administered 2019-11-24: 19:00:00 40 mg via INTRAMUSCULAR
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
## 2019-11-24 NOTE — Patient Instructions (Signed)
General Allergic Reactions  An allergic reaction is a set of symptoms caused by an allergen. An allergen is something that causes your immune system to react abnormally. It releases various chemicals. These include histamine. Histamine causes swelling and itching. An allergic reaction may affect the entire body. This is called a general allergic reaction. Often symptoms affect only one part of the body. This is called a local allergic reaction.   You are having an allergic reaction. Almost anything can cause one. Different people are allergic to different things. It is usually something that you ate or swallowed, came into contact with by getting or putting it on your skin or clothes, or something you breathed in the air. This can be very annoying and sometimes scary.   Most people think of allergic reactions when they have a rash or itchy skin. Other symptoms can include:   · Itching of the eyes, nose, and roof of the mouth  · Runny or stuffy nose  · Watery eyes   · Sneezing or coughing   · A blocked feeling in the ear  · Red, raised, itchy rash called hives  · Red and purple spots  · Rash, redness, welts, blisters  · Itching, burning, stinging, pain  · Dry, flaky, cracking, scaly skin  Severe symptoms include:  · Swelling of the face, lips, or other parts of the body  · Hoarse voice  · Trouble swallowing, feeling like your throat is closing  · Trouble breathing, wheezing  · Nausea, vomiting, diarrhea, stomach cramps  · Feeling faint or lightheaded, rapid heart rate  Sometimes the cause may be obvious. But there are so many things that can cause a reaction that you may not be able to figure it out. The most important things to help find your allergen are to remember:   · When it started  · What you were doing at the time or just before that  · What activities you were involved in  · If you were exposed to anything new  Below are some common causes of allergies. Some of these can cause severe general allergic  reactions. Others can cause mild to moderate symptoms. But remember that almost anything can cause a reaction. You may not even be aware that you came into contact with one of these things:   · Dust, mold, pollen  · Plants (common ones are poison ivy and poison oak, but there are many others)   · Animals  · Foods such as shrimp, shellfish, peanuts, milk products, gluten, and eggs  · Food colorings, flavorings, and additives  · Insect bites or stings such as bees, mosquitoes, fleas, and ticks  · Medicines such as penicillin, sulfa medicines, aspirin, and ibuprofen. But any medicine can cause a reaction.  · Jewelry such as nickel or gold. This can be new, or something you’ve worn for a while, including zippers and buttons.  · Latex such as in gloves, clothes, toys, balloons, or some tapes. Some people allergic to latex may also have problems with foods like bananas, avocados, kiwi, papaya, or chestnuts.  · Lotions, perfumes, cosmetics, soaps, shampoos, skincare products, nail products  · Chemicals or dyes in clothing, linen, cleaners, hair dyes, soaps, iodine  Many viruses and common colds can cause a rash that is not an allergic reaction. Sometimes it is hard to tell the difference between allergies, sensitivity, or an intolerance to something. This is especially true with food. Many things can cause diarrhea, vomiting, stomach cramps, and skin irritation.   Home care    The   goal of treatment is to help relieve the symptoms and get you feeling better. The rash will usually fade over several days. But it can sometimes last a couple of weeks. Over the next couple of days, there may be times when it gets a little worse, and then better again. Here are some things to do:   · If you know what you are allergic to, stay away from it. Future exposures may cause similar or sometimes worse symptoms.  · Don't wear tight clothing and stay away from anything that heats up your skin such as hot showers or baths, and direct  sunlight. Heat will make itching worse.  · An ice pack will relieve local areas of intense itching and redness. To make an ice pack, put ice cubes in a plastic bag that seals at the top. Wrap it in a thin, clean towel. Don’t put the ice directly on the skin because it can damage the skin.  · Oral diphenhydramine is an over-the-counter antihistamine sold at pharmacies and grocery stores. Unless a prescription antihistamine was given, diphenhydramine may be used to reduce itching if large areas of the skin are involved. It may make you sleepy. So be careful using it in the daytime or when going to school, working, or driving. Note: Don’t use diphenhydramine if you have glaucoma or if you are a man with trouble urinating because of an enlarged prostate. There are other antihistamines that won’t make you so sleepy. These are good choices for daytime use. Ask your healthcare provider or pharmacist for suggestions.  · Don’t use diphenhydramine cream on your skin unless prescribed. It may cause a worse reaction in some people.  · To help prevent an infection, don't scratch the affected area. Scratching may worsen the reaction and damage your skin. It can also lead to an infection. Always check the affected areas for signs of an infection.  · Call your healthcare provider and ask what you can use to help decrease the itching.  · To decrease your exposure to allergens, try the following:    ? Use heat-steam to clean your home.  ? Use high-efficiency particulate (HEPA) vacuums and filters.  ? Stay away from food and pet triggers.  ? Kill any cockroaches and use pest control to keep further infestations from happening.  ? Clean your house often.  Follow-up care  Follow up with your healthcare provider, or as advised. If you had a severe reaction today, or if you have had several mild to medium allergic reactions in the past, ask your provider about allergy testing. This can help you find out what you are allergic to. If you  had a severe reaction that included dizziness, fainting, or trouble breathing or swallowing, ask your provider about carrying auto-injectable epinephrine.   Call 911  Call 911 if any of these occur:   · Trouble breathing or swallowing, wheezing  · Cool, moist, pale skin  · Shortness of breath  · Hoarse voice or trouble speaking  · Confusion   · Very drowsy or trouble awakening  · Fainting or loss of consciousness  · Rapid heart rate  · Feeling of dizziness or weakness or a sudden drop in blood pressure  · Feeling of doom  · Feeling lightheaded  · Severe nausea or vomiting, or diarrhea  · Seizure  · Swelling in the face, eyelids, lips, mouth, throat, or tongue  · Drooling  When to seek medical advice  Call your healthcare provider or get medical care right away if any of these occur:   ·   Spreading areas of itching, redness, or swelling  · Nausea or stomach cramps or abdominal pain  · Continuing or recurring symptoms  · Spreading areas of redness, swelling, or itching  · Signs of infection at the affected site:  ? Spreading redness  ? Increased pain or swelling  ? Fluid or colored drainage from the site  ? Fever of 100.4°F (38°C) or above lasting for 24 to 48 hours, or as directed by your provider  [employer] last reviewed this educational content on 01/23/2018  © 2000-2021 [employer]. All rights reserved. This information is not intended as a substitute for professional medical care. Always follow your healthcare professional's instructions.

## 2020-01-02 ENCOUNTER — Ambulatory Visit (INDEPENDENT_AMBULATORY_CARE_PROVIDER_SITE_OTHER): Payer: Medicaid Other | Admitting: Vascular Neurology

## 2020-01-06 ENCOUNTER — Other Ambulatory Visit (INDEPENDENT_AMBULATORY_CARE_PROVIDER_SITE_OTHER): Payer: Self-pay

## 2020-01-06 DIAGNOSIS — M5412 Radiculopathy, cervical region: Secondary | ICD-10-CM

## 2020-01-06 DIAGNOSIS — M542 Cervicalgia: Secondary | ICD-10-CM

## 2020-01-07 MED ORDER — PREGABALIN 200 MG PO CAPS
200.00 mg | ORAL_CAPSULE | Freq: Three times a day (TID) | ORAL | 0 refills | Status: DC
Start: 2020-01-07 — End: 2020-01-13

## 2020-01-13 ENCOUNTER — Ambulatory Visit (INDEPENDENT_AMBULATORY_CARE_PROVIDER_SITE_OTHER): Payer: Medicare Other | Admitting: Family Nurse Practitioner

## 2020-01-13 ENCOUNTER — Ambulatory Visit (INDEPENDENT_AMBULATORY_CARE_PROVIDER_SITE_OTHER): Payer: Medicaid Other | Admitting: Vascular Neurology

## 2020-01-13 ENCOUNTER — Encounter (INDEPENDENT_AMBULATORY_CARE_PROVIDER_SITE_OTHER): Payer: Self-pay | Admitting: Vascular Neurology

## 2020-01-13 ENCOUNTER — Ambulatory Visit (INDEPENDENT_AMBULATORY_CARE_PROVIDER_SITE_OTHER): Payer: Medicare Other | Admitting: Vascular Neurology

## 2020-01-13 VITALS — BP 101/68 | HR 64 | Ht 61.25 in | Wt 85.2 lb

## 2020-01-13 DIAGNOSIS — F17200 Nicotine dependence, unspecified, uncomplicated: Secondary | ICD-10-CM

## 2020-01-13 DIAGNOSIS — F17210 Nicotine dependence, cigarettes, uncomplicated: Secondary | ICD-10-CM

## 2020-01-13 DIAGNOSIS — M542 Cervicalgia: Secondary | ICD-10-CM

## 2020-01-13 DIAGNOSIS — Z79899 Other long term (current) drug therapy: Secondary | ICD-10-CM

## 2020-01-13 DIAGNOSIS — G5603 Carpal tunnel syndrome, bilateral upper limbs: Secondary | ICD-10-CM

## 2020-01-13 DIAGNOSIS — M5412 Radiculopathy, cervical region: Secondary | ICD-10-CM

## 2020-01-13 MED ORDER — PREGABALIN 200 MG PO CAPS
200.00 mg | ORAL_CAPSULE | Freq: Three times a day (TID) | ORAL | 1 refills | Status: DC
Start: 2020-01-13 — End: 2020-02-12

## 2020-01-13 NOTE — Patient Instructions (Addendum)
Our plan:     Continue Lyrica.  Follow up with hand specialist.   Continue to reduce tobacco use. Referral to smoking cessation program.      Please call the office at either [phone] or [phone] to schedule a follow up appointment as discussed.    Today's Visit:      In today's visit, I reviewed your medications and records relating your health - prior testing, blood work, reports of other health care providers present in your electronic medical record.     If you have pertinent records from any non[facility] doctors that you would like to review, please have them sent to [place] or bring to them to your next office visit.     A copy of today's visit will be sent to your referring doctor and/or primary care doctor, if you have one listed in our system.    Let me know if there are things we could have done better for your office visit.    Patient satisfaction survey:      If you receive a patient satisfaction survey, I would greatly appreciate it if you would complete it. We value your feedback.     Contact me online:      Patient Portal online - Please sign up for MyChart -- this is the best way to communicate with your team here.  There is a messaging feature, where you can [place] messages anytime of day.  It is the best way to communicate with [place] and get test results, medication refills, or ask questions.     You can expect to get a response within 24-72 hours during weekdays.  If you do not receive a timely response, resend your request and inform us. My goal is for every question answered ever day.  Average response for a phone call maybe 3 days due to the volume we receive (which is why MyChart is preferred).    If you are having a medical emergency -- call 911, DO NOT SEND A MESSAGE THROUGH MYCHART.     Coupons for medication:      If you have any trouble affording your medications, check out [email or web address].com for coupons and competitive prices in your area.  If you need further assistance, let us know so we can  work with you and your insurance to make sure you get the best care.    Thank you for trusting me with your health.      Take care,     Artemio Aly, MD  [facility]

## 2020-01-13 NOTE — Progress Notes (Signed)
Subjective:      Patient ID: Ann Patterson  CC:   Chief Complaint   Patient presents with    Neck Pain     follow up        HPI  The patient was seen by televideo 6 months ago.  She says things are going very well.  She continues on Lyrica 200 mg 3 times a day, and her neck feels pretty good in general.  She does have some discomfort on the right side which is more midline now.  She does her typical exercises.    Her hands are doing fairly well although she has been dealing with some finger pain and is planning to follow-up with her hand specialist.    Her low weight remains an issue, and she has been drinking protein shakes at the direction of her primary doctor.    She continues to smoke tobacco, but has cut down to less than 10 cigarettes a month.  She is trying to cut down further.  Occasionally she will smoke marijuana, as it does help with some pain and she thinks it may increase her appetite.    Results for orders placed or performed during the hospital encounter of 03/17/18   CT Head WO Contrast    Narrative    TECHNIQUE: CT of the head without intravenous contrast.     The following dose reduction techniques were utilized: Automated  exposure control and/or adjustment of the mA and/or kV according to  patient size, and the use of iterative reconstruction technique.    INDICATION: drunk fall     COMPARISON: 10/28/2016.    FINDINGS:     Atherosclerotic calcifications. Mild supratentorial white matter changes  suggestive of chronic microvascular ischemia.    No acute intracranial hemorrhage, mass effect, loss of gray-white matter  differentiation, or evidence of acute ventricular outflow obstruction.     No depressed skull fracture or aggressive appearing osseous lesions.     The visualized paranasal sinuses and temporal bone structures are  well-aerated.       Impression    No acute intracranial abnormality.     Ann Harman, MD   03/18/2018 12:34 AM     Objective:     Vitals: Reviewed.    Visit Vitals  BP 101/68 (BP Site: Right arm, Patient Position: Sitting, Cuff Size: Small)   Pulse 64   Ht 1.556 m (5' 1.25")   Wt (!) 38.6 kg (85 lb 3.2 oz)   BMI 15.97 kg/m     Respiratory rate appears normal.    General: WDWN, NAD. Vital signs reviewed.  HEENT: No nasal discharges, no obvious oral masses. No icterus or conjunctival hemorrhage noted.  Neck: Full ROM. No neck masses noted.  Psychiatric: Cooperative with exam, normal affect. Thought process and speech pattern normal.    NEUROLOGICAL EXAM    Mental Status: Awake, alert, oriented. Speech fluent without dysarthria. Follows commands well. Attention, memory, and concentration intact. Fund of knowledge appropriate for education.     Cranial Nerves: PERRL. Extraocular movements are full. No nystagmus seen. Facial sensation to light touch appears intact. Face is symmetric. Hearing is intact to conversational speech. Tongue midline. Cranial nerves II-XII otherwise appears intact.     Motor: Moves all extremities well, without UE drift. No obvious focal weakness noted. Bulk appears normal. No tremor noted.    Sensation: Light touch appears intact.     Coordination: No obvious dysmetria.     Gait: normal, steady.  Assessment:       1. Neck pain    2. Cervical radiculopathy    3. Carpal tunnel syndrome on both sides    4. Medication management    5. Current smoker        Plan:      1. Continue Lyrica 200mg  TID.  2. Follow up with hand specialist RE: finger pain.  3. Continue to reduce tobacco use. Referral to smoking cessation program given. 3 minutes spent counseling.   4. Follow up in 6 months.      The patient will return for follow-up as outlined above. If there are any problems with medications (if prescribed) or questions about any available test results, or if there are any new symptoms or changes in current symptoms, the patient is instructed to call the office.         Chaney Ingram B. Stacy Gardner, MD  Director, [facility]  Co-Director, [place]  [facility], Neurology  Board Certified, Sleep Medicine  Board Certified, Clinical Neurophysiology  Board Certified, Vascular Neurology        *This note was generated by the Epic EMR system/ Dragon speech recognition and may contain inherent errors or omissions not intended by the user. Grammatical errors, random word insertions, deletions, pronoun errors and incomplete sentences are occasional consequences of this technology due to software limitations. Not all errors are caught or corrected. If there are questions or concerns about the content of this note or information contained within the body of this dictation they should be addressed directly with the author for clarification.*

## 2020-02-12 ENCOUNTER — Other Ambulatory Visit (INDEPENDENT_AMBULATORY_CARE_PROVIDER_SITE_OTHER): Payer: Self-pay | Admitting: Vascular Neurology

## 2020-02-12 DIAGNOSIS — M542 Cervicalgia: Secondary | ICD-10-CM

## 2020-02-12 DIAGNOSIS — M5412 Radiculopathy, cervical region: Secondary | ICD-10-CM

## 2020-04-13 ENCOUNTER — Other Ambulatory Visit (INDEPENDENT_AMBULATORY_CARE_PROVIDER_SITE_OTHER): Payer: Self-pay | Admitting: Vascular Neurology

## 2020-04-13 DIAGNOSIS — M5412 Radiculopathy, cervical region: Secondary | ICD-10-CM

## 2020-04-13 DIAGNOSIS — M542 Cervicalgia: Secondary | ICD-10-CM

## 2020-06-14 ENCOUNTER — Other Ambulatory Visit (INDEPENDENT_AMBULATORY_CARE_PROVIDER_SITE_OTHER): Payer: Self-pay | Admitting: Vascular Neurology

## 2020-06-14 DIAGNOSIS — M5412 Radiculopathy, cervical region: Secondary | ICD-10-CM

## 2020-06-14 DIAGNOSIS — M542 Cervicalgia: Secondary | ICD-10-CM

## 2020-06-15 ENCOUNTER — Telehealth (INDEPENDENT_AMBULATORY_CARE_PROVIDER_SITE_OTHER): Payer: Self-pay | Admitting: Vascular Neurology

## 2020-06-15 NOTE — Telephone Encounter (Signed)
Patient called regarding refill for Lyrica 200 MG capsule.Patient is completely out of medicine and pharmacy can't refill medicine because the insurance is requiring an authorization. Please return call at noted phone number below.      Patient Preferred Callback Number:    [phone]

## 2020-06-16 NOTE — Telephone Encounter (Signed)
Patient called again this morning to follow-up on prior authorization for Lyrica prescription.

## 2020-06-18 NOTE — Telephone Encounter (Signed)
Has this been submitted?

## 2020-08-11 ENCOUNTER — Other Ambulatory Visit (INDEPENDENT_AMBULATORY_CARE_PROVIDER_SITE_OTHER): Payer: Self-pay | Admitting: Vascular Neurology

## 2020-08-11 DIAGNOSIS — M542 Cervicalgia: Secondary | ICD-10-CM

## 2020-08-11 DIAGNOSIS — M5412 Radiculopathy, cervical region: Secondary | ICD-10-CM

## 2020-09-07 ENCOUNTER — Ambulatory Visit (INDEPENDENT_AMBULATORY_CARE_PROVIDER_SITE_OTHER): Payer: Medicare Other | Admitting: Vascular Neurology

## 2020-09-10 ENCOUNTER — Encounter (INDEPENDENT_AMBULATORY_CARE_PROVIDER_SITE_OTHER): Payer: Self-pay | Admitting: Vascular Neurology

## 2020-09-10 ENCOUNTER — Encounter (INDEPENDENT_AMBULATORY_CARE_PROVIDER_SITE_OTHER): Payer: 59 | Admitting: Vascular Neurology

## 2020-09-10 VITALS — Ht 61.0 in | Wt 100.0 lb

## 2020-10-01 NOTE — Progress Notes (Unsigned)
error 

## 2020-10-11 ENCOUNTER — Other Ambulatory Visit (INDEPENDENT_AMBULATORY_CARE_PROVIDER_SITE_OTHER): Payer: Self-pay | Admitting: Vascular Neurology

## 2020-10-11 DIAGNOSIS — M542 Cervicalgia: Secondary | ICD-10-CM

## 2020-10-11 DIAGNOSIS — M5412 Radiculopathy, cervical region: Secondary | ICD-10-CM

## 2020-11-09 ENCOUNTER — Other Ambulatory Visit (INDEPENDENT_AMBULATORY_CARE_PROVIDER_SITE_OTHER): Payer: Self-pay | Admitting: Vascular Neurology

## 2020-11-09 DIAGNOSIS — M5412 Radiculopathy, cervical region: Secondary | ICD-10-CM

## 2020-11-09 DIAGNOSIS — M542 Cervicalgia: Secondary | ICD-10-CM

## 2020-12-07 ENCOUNTER — Telehealth: Payer: Self-pay

## 2020-12-07 ENCOUNTER — Encounter (INDEPENDENT_AMBULATORY_CARE_PROVIDER_SITE_OTHER): Payer: Self-pay | Admitting: Internal Medicine

## 2020-12-07 ENCOUNTER — Encounter (INDEPENDENT_AMBULATORY_CARE_PROVIDER_SITE_OTHER): Payer: Self-pay

## 2020-12-07 ENCOUNTER — Ambulatory Visit (INDEPENDENT_AMBULATORY_CARE_PROVIDER_SITE_OTHER): Payer: 59 | Admitting: Internal Medicine

## 2020-12-07 VITALS — BP 121/73 | HR 73 | Temp 97.1°F | Resp 15 | Ht 60.5 in | Wt 82.8 lb

## 2020-12-07 DIAGNOSIS — M810 Age-related osteoporosis without current pathological fracture: Secondary | ICD-10-CM

## 2020-12-07 DIAGNOSIS — M549 Dorsalgia, unspecified: Secondary | ICD-10-CM

## 2020-12-07 DIAGNOSIS — Z79899 Other long term (current) drug therapy: Secondary | ICD-10-CM

## 2020-12-07 DIAGNOSIS — M25551 Pain in right hip: Secondary | ICD-10-CM

## 2020-12-07 HISTORY — DX: Age-related osteoporosis without current pathological fracture: M81.0

## 2020-12-07 MED ORDER — PROLIA 60 MG/ML SC SOSY
60.0000 mg | PREFILLED_SYRINGE | Freq: Once | SUBCUTANEOUS | 0 refills | Status: AC
Start: 2020-12-07 — End: 2020-12-07

## 2020-12-07 NOTE — Patient Instructions (Addendum)
1. Order PTH, TSH, phosphate, Vit D, CBC, CMP  2. Start Prolia 60 mg SQ every 6 months   3. Suggest calcium 600 mg bid and Vit D 1000 IU bid   4. Weight bearing exercise  5. Suggest Tylenol as needed   6. Stop smoking and gain weight   7. Follow 4-6 weeks or early

## 2020-12-07 NOTE — Telephone Encounter (Addendum)
PA Status Update     PA Required     Drug: PROLIA         Status:   PA Approved til 12/07/2021  Copay $0. May fill with Wylene Simmer

## 2020-12-07 NOTE — Progress Notes (Signed)
Initial Rheumatology Consultation    Chief Complaint:     Osteoporosis       HPI:   This patient is a 70 y.o. year old female with Osteoporosis ( started Prolia in 2020) chronic smoker, DJD of C-spine and L-spine, OA of Rt hip, Polio with left leg muscle atrophy referred by Shon Baton, MD to evaluate Osteoporosis.     She has Osteoporosis.  She started Prolia in 2020 with her GYN Dr. Ralene Bathe at [facility]. She had Prolia in 03/2020.     She takes calcium 600 mg BID and Vit D 2000 IU daily. She does walking exercise 30 minutes and does yard work. She had no history of cancer, chemotherapy or radiation therapy.    The patient's risk factors for osteoporosis include:  Had hysterectomy without hormone replacement therapy  History of fragility fracture of   at age of  due to fall  No Family history of osteoporosis and hip fracture  Current smoker: half pack day, smoke for  50 years  No Alcohol usages drinks per day  She has weight loss 20 lbs in the past over 10 yrs. Her weight is about 88 lbs.       DXA bone Density study on 07/19/19:   The AP Spine BMD=  g/cm2, T-Score= -0.1  The left femoral neck BMD=  g/cm2, T-Score= -2.2  The left hip BMD= g/cm2, T-Score=-2.8     24% increase from baseline in 2012. Please note that lumbar spine BMD values may  be falsely elevated due to sclerosis.    She has back pain.         PMSH:     Past Medical History:   Diagnosis Date    Headache     Hyperlipidemia     Low back pain     Meningitis spinal 1956    spinal and "regular    Neuropathy     upper c spine issues, + carpal tunnel symptoms bilaterally    Pain     Pneumonia 2015    Polio     Polio 1956    Vertigo        Past Surgical History:   Procedure Laterality Date    APPENDECTOMY (OPEN)      70 years of age    BACK SURGERY  2005    plate in her neck-    CARPAL TUNNEL RELEASE      bilateral hands 1994 and 1996    EXTRACTION, CATARACT, PHACO, IOL Left 02/28/2019    Procedure: EXTRACTION, CATARACT, PHACO, IOL;  Surgeon: Belinda Block, MD;  Location: [facility] OR;  Service: Ophthalmology;  Laterality: Left;  LEFT  EYE PHACO WITH IOL    EXTRACTION, CATARACT, PHACO, IOL Right 03/14/2019    Procedure: EXTRACTION, CATARACT, PHACO, IOL;  Surgeon: Belinda Block, MD;  Location: [facility] OR;  Service: Ophthalmology;  Laterality: Right;  RIGHT  EYE PHACO WITH IOL    FRACTURE SURGERY Left     at 69 years of age    HYSTERECTOMY      1995    ORIF, FINGER Left 03/02/2015    Procedure: ORIF, FINGER;  Surgeon: Kellie Simmering, MD;  Location: [facility] OR;  Service: Plastics;  Laterality: Left;  LEFT SMALL FINGER METACARPAL ORIF    REPAIR, HAND, NERVE  12/10/2012    Procedure: REPAIR, HAND, NERVE;  Surgeon: Kellie Simmering, MD;  Location: [facility];  Service: Plastics;  Laterality: Left;  LEFT  SMALL FINGER EXPLORATION; REPAIR DIGITAL NERVE    REPAIR, LIGAMENT Left 03/02/2015    Procedure: REPAIR, LIGAMENT;  Surgeon: Kellie Simmering, MD;  Location: [facility] OR;  Service: Plastics;  Laterality: Left;  LEFT MIDDLE FINGER RADIAL COLLATERAL LIGAMENT REPAIR       Allergies:     Allergies   Allergen Reactions    Gadolinium      Heart fluttering    Iodine      Heart fluttering    Penicillins Hives       Meds:     Current Outpatient Medications:     Ascorbic Acid (VITAMIN C PO), Take 1 tablet by mouth, Disp: , Rfl:     denosumab (PROLIA) 60 MG/ML Solution Prefilled Syringe subcutaneous injection, Inject 60 mg into the skin once Twice a year , Disp: , Rfl:     fluticasone (FLONASE) 50 MCG/ACT nasal spray, APPLY ONE SPRAY IN EACH NOSTRIL EVERY DAY, Disp: , Rfl:     Lyrica 200 MG capsule, TAKE ONE CAPSULE BY MOUTH THREE TIMES A DAY, Disp: 90 capsule, Rfl: 0    oxybutynin XL (DITROPAN-XL) 5 MG 24 hr tablet, Take 5 mg by mouth daily, Disp: , Rfl:     raloxifene (EVISTA) 60 MG tablet, Take 1 tablet (60 mg total) by mouth daily., Disp: 30 tablet, Rfl: 3    betamethasone dipropionate (DIPROLENE) 0.05 % cream, Apply topically 2 (two) times daily  (Patient not taking: Reported on 12/07/2020), Disp: 30 g, Rfl: 0    denosumab (Prolia) 60 MG/ML Solution Prefilled Syringe subcutaneous injection, Inject 60 mg into the skin once for 1 dose, Disp: 1 mL, Rfl: 0    diphenhydrAMINE (BENADRYL) 25 MG tablet, Take 1 tablet (25 mg total) by mouth every 6 (six) hours as needed for Itching (or swelling) May cause drowsiness (Patient not taking: Reported on 12/07/2020), Disp: 30 tablet, Rfl: 0    loratadine (Claritin) 10 MG tablet, Take 1 tablet (10 mg total) by mouth daily (Patient not taking: Reported on 12/07/2020), Disp: 30 tablet, Rfl: 0    FH:     Family History   Problem Relation Age of Onset    Myocardial Infarction Mother     Diverticulitis Sister     Diabetes Maternal Grandmother     Brain cancer Maternal Grandfather     Breast cancer Neg Hx        SH:     Social History     Socioeconomic History    Marital status: Single   Tobacco Use    Smoking status: Every Day     Packs/day: 0.50     Years: 30.00     Pack years: 15.00     Types: Cigarettes    Smokeless tobacco: Never   Vaping Use    Vaping Use: Never used   Substance and Sexual Activity    Alcohol use: Yes     Comment: social  wine    Drug use: Not Currently     Frequency: 2.0 times per week     Types: Marijuana     Comment: uses fro pain rellief  will hold use 02/28/15       ROS:   All other systems reviewed and negative except as described above.        PHYSICAL EXAM:   There were no vitals filed for this visit.    General appearance - alert, well appearing, and in no distress  Mental status - alert, oriented to person, place, and  time  Eyes - extraocular eye movements intact  Back exam - + tenderness of para L-spine  Neurological - alert, oriented, normal speech, no focal findings or movement disorder noted  Extremities - peripheral pulses normal, no pedal edema, no clubbing or cyanosis  Skin - normal coloration and turgor, no rashes, no suspicious skin lesions noted  Musculoskeletal - No tenderness of MCPs and  PIPs. Good range of motion of shoulders, elbows, wrists and small joints of hands. Good range of motion of hips, knees and ankles.  Knees crepitus with range of motion of knees without effusions. Ankles without effusions.  No tenderness of MTPs or effusions.   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             Labs:     Component      Latest Ref Rng & Units 03/18/2018   WBC      3.10 - 9.50 x10 3/uL 9.24   Hemoglobin      11.4 - 14.8 g/dL 95.1   Hematocrit      34.7 - 43.7 % 36.1   Platelet Count      142 - 346 x10 3/uL 206   RBC      3.90 - 5.10 x10 6/uL 4.09   MCV      78.0 - 96.0 fL 88.3   MCH      25.1 - 33.5 pg 29.8   MCHC      31.5 - 35.8 g/dL 88.4   RDW      11 - 15 % 19 (H)   MPV      8.9 - 12.5 fL 10.2   Neutrophils      None % 45.9   Lymphocytes Automated      None % 41.5   Monocytes      None % 9.6   Eosinophils Automated      None % 2.1   Basophils Automated      None % 0.6   Immature Granulocytes      None % 0.3   Nucleated RBC      0.0 - 0.0 /100 WBC 0.0   Neutrophils Absolute      1.10 - 6.33 x10 3/uL 4.24   Lymphocytes Absolute Automated      0.42 - 3.22 x10 3/uL 3.83 (H)   Monocytes Absolute Automated      0.21 - 0.85 x10 3/uL 0.89 (H)   Eosinophils Absolute Automated      0.00 - 0.44 x10 3/uL 0.19   Basophils Absolute Automated      0.00 - 0.08 x10 3/uL 0.06   Immature Granulocytes Absolute      0.00 - 0.07 x10 3/uL 0.03   Nucleated RBC Absolute      0.00 - 0.00 x10 3/uL 0.00   Glucose      70 - 100 mg/dL 93   BUN      7.0 - 16.6 mg/dL 06.3   Creatinine      0.6 - 1.0 mg/dL 0.8   Calcium      8.5 - 10.5 mg/dL 8.9   Sodium      016 - 145 mEq/L 141   Potassium      3.5 - 5.1 mEq/L 3.7   Chloride      100 - 111 mEq/L 106   CO2      22 - 29 mEq/L 22       Radiology:     DXA bone density  axial skeleton [IMG572] (Order [id number])  Status: Final result     Study Result    Narrative & Impression   INDICATION: Osteoporosis screening.  Menopause     INTERPRETATION:  DEXA methodology was utilized to evaluate  the bone  mineral density.  The measurements are as follows:     PRIOR: 04/25/2011     Left hip- total:  BMD:  0.604 g/cm2.  T-score:  -2.8  Z- score: -1.4  7.3% increase from baseline.     Left femoral neck:  BMD: 0.610 g/cm/2  T-score: -2.2  Z- score: -0.5     Spine (L1-L4):  BMD:  1.032 g/cm2.  T-score:  -0.1  Z- score: 1.7  24% increase from baseline. Please note that lumbar spine BMD values may  be falsely elevated due to sclerosis.        IMPRESSION:    Osteoporosis of the left hip. Osteopenia of the left femoral  neck.       ASSESSMENT:   Ann Patterson is a 70 y.o. female with Osteoporosis ( started Prolia in 2020) chronic smoker, DJD of C-spine and L-spine, OA of Rt hip, Polio with left leg muscle atrophy.    She has Osteoporosis and had Prolia in 12/21.       PLAN:     1. Order PTH, TSH, phosphate, Vit D, CBC, CMP  2. Start Prolia 60 mg SQ every 6 months   3. Suggest calcium 600 mg bid and Vit D 1000 IU bid   4. Weight bearing exercise  5. Suggest Tylenol as needed   6. Stop smoking and gain weight   7. Follow 4-6 weeks or early

## 2020-12-08 NOTE — Telephone Encounter (Signed)
Prolia delivery set for 8/19 to [facility].     Please call patient to set appointment date.

## 2020-12-09 ENCOUNTER — Other Ambulatory Visit (INDEPENDENT_AMBULATORY_CARE_PROVIDER_SITE_OTHER): Payer: Self-pay | Admitting: Vascular Neurology

## 2020-12-09 DIAGNOSIS — M542 Cervicalgia: Secondary | ICD-10-CM

## 2020-12-09 DIAGNOSIS — M5412 Radiculopathy, cervical region: Secondary | ICD-10-CM

## 2020-12-11 ENCOUNTER — Other Ambulatory Visit (INDEPENDENT_AMBULATORY_CARE_PROVIDER_SITE_OTHER): Payer: Self-pay | Admitting: Vascular Neurology

## 2020-12-11 DIAGNOSIS — M5412 Radiculopathy, cervical region: Secondary | ICD-10-CM

## 2020-12-11 DIAGNOSIS — M542 Cervicalgia: Secondary | ICD-10-CM

## 2020-12-11 MED ORDER — PREGABALIN 200 MG PO CAPS
200.0000 mg | ORAL_CAPSULE | Freq: Three times a day (TID) | ORAL | 0 refills | Status: DC
Start: 2020-12-11 — End: 2021-01-10

## 2020-12-14 ENCOUNTER — Ambulatory Visit (INDEPENDENT_AMBULATORY_CARE_PROVIDER_SITE_OTHER): Payer: 59 | Admitting: Family Nurse Practitioner

## 2020-12-14 ENCOUNTER — Encounter (INDEPENDENT_AMBULATORY_CARE_PROVIDER_SITE_OTHER): Payer: Self-pay | Admitting: Family Nurse Practitioner

## 2020-12-14 ENCOUNTER — Other Ambulatory Visit: Payer: Self-pay | Admitting: Obstetrics & Gynecology

## 2020-12-14 VITALS — BP 104/64 | HR 72 | Ht 60.5 in | Wt 84.4 lb

## 2020-12-14 DIAGNOSIS — Z1239 Encounter for other screening for malignant neoplasm of breast: Secondary | ICD-10-CM

## 2020-12-14 DIAGNOSIS — G5603 Carpal tunnel syndrome, bilateral upper limbs: Secondary | ICD-10-CM

## 2020-12-14 DIAGNOSIS — M5412 Radiculopathy, cervical region: Secondary | ICD-10-CM

## 2020-12-14 DIAGNOSIS — M542 Cervicalgia: Secondary | ICD-10-CM

## 2020-12-14 DIAGNOSIS — Z13820 Encounter for screening for osteoporosis: Secondary | ICD-10-CM

## 2020-12-14 NOTE — Progress Notes (Signed)
12/14/2020    RE:  Hall Busing  PCP:  Shon Baton, MD     Chief Complaint:   Chief Complaint   Patient presents with    Neck Pain     History of Present Illness:  Ann Patterson is a 70 y.o. female who presents for her medication refill. She is here for her 6 month followed up which was unable to complete recently due to difficulty with the vidyo connect. She has a hx of neck pain and radiculopathy which is improved with her lyrica 200mg  TID. She remains active at home. Denies any side effects. She came to today just to ensure she stays on top of her appointments as well.     Review of Systems:    ROS as noted in HPI or BOLD:      Weight loss gain  Heartburn Ulcer Pain joint arm leg hand foot    Fever Tic bite Swallowing problems Left Right Weak arm leg hand foot all over   Night sweats Hearing problems tinnitus Left Right numbness and tingling arm leg hand foot   Rash Arm leg face Weakness Back Pain Neck Pain   Chest pain SOB Asthma   Stroke like spell Fatigue Daytime sleepiness Headache Facial Pain LOC Altered consciousness   Seizure like spell   Abdominal pain Diplopia Dizziness Sleep problems OSA EDS   Change in Bowels Eye pain Vision Change Anxiety Stress Depression   Hematuria Incontinence  Hoarseness Speech change Word finding difficulty   Nausea Vomit Diarrhea  Poor balance unsteady Falls close call Memory loss Cognitive issues       Past Medical History:  Past Medical History:   Diagnosis Date    Headache     Hyperlipidemia     Low back pain     Meningitis spinal 1956    spinal and "regular    Neuropathy     upper c spine issues, + carpal tunnel symptoms bilaterally    Pain     Pneumonia 2015    Polio     Polio 1956    Vertigo        Past Surgical History:  Past Surgical History:   Procedure Laterality Date    APPENDECTOMY (OPEN)      70 years of age    BACK SURGERY  2005    plate in her neck-    CARPAL TUNNEL RELEASE      bilateral hands 1994 and 1996    EXTRACTION, CATARACT, PHACO, IOL  Left 02/28/2019    Procedure: EXTRACTION, CATARACT, PHACO, IOL;  Surgeon: Belinda Block, MD;  Location: [facility];  Service: Ophthalmology;  Laterality: Left;  LEFT  EYE PHACO WITH IOL    EXTRACTION, CATARACT, PHACO, IOL Right 03/14/2019    Procedure: EXTRACTION, CATARACT, PHACO, IOL;  Surgeon: Belinda Block, MD;  Location: [facility];  Service: Ophthalmology;  Laterality: Right;  RIGHT  EYE PHACO WITH IOL    FRACTURE SURGERY Left     at 70 years of age    HYSTERECTOMY      1995    ORIF, FINGER Left 03/02/2015    Procedure: ORIF, FINGER;  Surgeon: Kellie Simmering, MD;  Location: [facility] OR;  Service: Plastics;  Laterality: Left;  LEFT SMALL FINGER METACARPAL ORIF    REPAIR, HAND, NERVE  12/10/2012    Procedure: REPAIR, HAND, NERVE;  Surgeon: Kellie Simmering, MD;  Location: [place] [facility];  Service: Plastics;  Laterality: Left;  LEFT  SMALL FINGER EXPLORATION; REPAIR DIGITAL NERVE    REPAIR, LIGAMENT Left 03/02/2015    Procedure: REPAIR, LIGAMENT;  Surgeon: Kellie Simmering, MD;  Location: [facility];  Service: Plastics;  Laterality: Left;  LEFT MIDDLE FINGER RADIAL COLLATERAL LIGAMENT REPAIR       Allergies:  Gadolinium, Iodine, and Penicillins    Family History:  Family History   Problem Relation Age of Onset    Myocardial Infarction Mother     Diverticulitis Sister     Diabetes Maternal Grandmother     Brain cancer Maternal Grandfather     Breast cancer Neg Hx      No other family history related to current complaints.    Social History:  Social History     Tobacco Use    Smoking status: Every Day     Packs/day: 0.50     Years: 30.00     Pack years: 15.00     Types: Cigarettes    Smokeless tobacco: Never   Vaping Use    Vaping Use: Never used   Substance Use Topics    Alcohol use: Yes     Comment: social  wine    Drug use: Not Currently     Frequency: 2.0 times per week     Types: Marijuana     Comment: uses fro pain rellief  will hold use 02/28/15       Medications:  Current Outpatient  Medications   Medication Sig Dispense Refill    Ascorbic Acid (VITAMIN C PO) Take 1 tablet by mouth      betamethasone dipropionate (DIPROLENE) 0.05 % cream Apply topically 2 (two) times daily 30 g 0    denosumab (PROLIA) 60 MG/ML Solution Prefilled Syringe subcutaneous injection Inject 60 mg into the skin once Twice a year        fluticasone (FLONASE) 50 MCG/ACT nasal spray APPLY ONE SPRAY IN EACH NOSTRIL EVERY DAY      loratadine (Claritin) 10 MG tablet Take 1 tablet (10 mg total) by mouth daily 30 tablet 0    oxybutynin XL (DITROPAN-XL) 5 MG 24 hr tablet Take 5 mg by mouth daily      pregabalin (Lyrica) 200 MG capsule Take 1 capsule (200 mg total) by mouth 3 (three) times daily 90 capsule 0    raloxifene (EVISTA) 60 MG tablet Take 1 tablet (60 mg total) by mouth daily. 30 tablet 3     No current facility-administered medications for this visit.     General Exam:  BP 104/64 (BP Site: Left arm, Patient Position: Sitting, Cuff Size: Small)    Pulse 72    Ht 1.537 m (5' 0.5")    Wt (!) 38.3 kg (84 lb 6.4 oz)    BMI 16.21 kg/m   General: The patient was well developed and well nourished.  No acute distress.  Extremities: no pedal edema, extremities normal in color  Mental Status: The patient was awake, alert and oriented X4.  Normal affect.  Recent and remote memory appeared to be normal.   Attention span and concentration appear normal.  Fluent without aphasia.   Registers 3/3 and recalls 3/3 after 5 minutes.  Cranial nerves: Pupils are equal, round and reactive to light.  Visual fields full.    EOM intact.  No ptosis.  No diplopia.  No nystagmus.  Facial sensation intact.   Face symmetric.  No dysarthria.  Hearing grossly intact.  Shoulder shrug symmetric.    Motor: Muscle tone normal.  Gait: Stable. Good stride length, good initiation.        Assessment:    - Hx of neck pain: well controlled on lyrica 200mg  TID based on pt subjective report. No side effects. CMP pending from other provider which could be sued to  ensure kidney function adequate for this dosing     Plan:    - Continue your lyrica 200mg  TID   - Follow up with Dr. Stacy Gardner in October to continue seeing him for Q6 month safe medication prescribing    In today's visit, I reviewed your medications and records relating your health - prior testing, blood work, reports of other health care providers present in your electronic medical record.     If you have pertinent records from any non-[facility] doctors that you would like to review, please have them sent to [place] or bring to them to your next office visit.     A copy of today's visit will be sent to your referring doctor and/or primary care doctor, if you have one listed in our system.    Let me know if there are things we could have done better for your office visit.    Patient satisfaction survey:      If you receive a patient satisfaction survey, I would greatly appreciate it if you would complete it. We value your feedback.     Contact me online:      Patient Portal online - Please sign up for MyChart -- this is the best way to communicate with your team here.  There is a messaging feature, where you can [place] messages anytime of day.  It is the best way to communicate with [place] and get test results, medication refills, or ask questions.     You can expect to get a response within 24-48 hours during weekdays.  If you do not receive a timely response, resend your request and inform us. My goal is for every question answered ever day.  Average response for a phone call maybe 3 days due to the volume we receive (which is why MyChart is preferred).    If you are having a medical emergency -- call 911, DO NOT SEND A MESSAGE THROUGH MYCHART.     Coupons for medication:      If you have any trouble affording your medications, check out www.goodrx.com for coupons and competitive prices in your area.  If you need further assistance, let us know so we can work with you and your insurance to make sure you get the best care.    Thank  you for trusting me with your health.      Most recent reports of labs/imaging reviewed      A total of 25 minutes were spent on patient care, including but not limited to reviewing pt chart/reviewing test results, documenting in patient medical record, placing and reviewing orders, communication w/ other healthcare providers, performing physical exam, and face-to-face with the patient during the encounter w/ over half the time spent on counseling/education and coordination of care.    Dr Stacy Gardner available in a supervisory capacity.     Kerrin Champagne MSN FNP  Family Nurse Practitioner  [facility] for [facility]   [address]  [place], [place] [address]

## 2020-12-16 ENCOUNTER — Telehealth (INDEPENDENT_AMBULATORY_CARE_PROVIDER_SITE_OTHER): Payer: Self-pay

## 2020-12-16 NOTE — Telephone Encounter (Signed)
Rec'vd prolia at [facility].       Ordered under SP.

## 2020-12-18 ENCOUNTER — Ambulatory Visit
Admission: RE | Admit: 2020-12-18 | Discharge: 2020-12-18 | Disposition: A | Discharge status: Home / Self Care | Payer: 59 | Source: Ambulatory Visit | Attending: Obstetrics & Gynecology | Admitting: Obstetrics & Gynecology

## 2020-12-18 ENCOUNTER — Other Ambulatory Visit: Payer: Self-pay | Admitting: Obstetrics & Gynecology

## 2020-12-18 DIAGNOSIS — M810 Age-related osteoporosis without current pathological fracture: Secondary | ICD-10-CM | POA: Insufficient documentation

## 2020-12-18 DIAGNOSIS — Z13820 Encounter for screening for osteoporosis: Secondary | ICD-10-CM

## 2020-12-18 DIAGNOSIS — Z1231 Encounter for screening mammogram for malignant neoplasm of breast: Secondary | ICD-10-CM | POA: Insufficient documentation

## 2020-12-18 DIAGNOSIS — Z1239 Encounter for other screening for malignant neoplasm of breast: Secondary | ICD-10-CM

## 2020-12-18 DIAGNOSIS — M85852 Other specified disorders of bone density and structure, left thigh: Secondary | ICD-10-CM | POA: Insufficient documentation

## 2020-12-23 ENCOUNTER — Telehealth (INDEPENDENT_AMBULATORY_CARE_PROVIDER_SITE_OTHER): Payer: Self-pay | Admitting: Internal Medicine

## 2020-12-23 NOTE — Telephone Encounter (Signed)
The pt is calling, very upset, regarding her Prolia injection. She is aware the medication has been delivered to the office, but she did not receive a call to schedule. She is scheduled for F/U + Prolia on 11/18. She was offered an earlier appt on 10/21, but declined. Per pt, her last injection was 03/2020, and she is way overdue. She would like to come in asap for the injection. The pt would like a call back to discuss.

## 2020-12-25 ENCOUNTER — Telehealth (INDEPENDENT_AMBULATORY_CARE_PROVIDER_SITE_OTHER): Payer: Self-pay

## 2020-12-25 NOTE — Telephone Encounter (Signed)
Advised pt Per Dr. Tacey Ruiz tp have labs drawn prior to prolia injection.     Pt expresses agitation and states "I'm tired of this bullshit, you're not telling me what to do and I'm not doing the damn lab work or bone density test done every time I have the shot!"  "All I care about is to get my Damn prolia, I'm the patient!     Placed pt on hold to speak with manager.     Advised Pt she can speak with manager Mirna.

## 2020-12-25 NOTE — Telephone Encounter (Signed)
Pt needs to do her labs. Come for Prolia injection. Please call pt. Thanks.

## 2020-12-25 NOTE — Telephone Encounter (Signed)
The patient is calling back, again, regarding labs needed prior to her Prolia injection. She is very upset, and refuses to complete the labs every time she has to get her Prolia injection. Per pt, she has an order from another doctor and will get those labs done, once. She details trauma she experienced at 70yrs old from a spinal tap associated with 7 different types of Polio she was diagnosed with. The patient is irate and yelling that she did not have to do labs every time with her OBGYN for Prolia, and will not do so now. She would like a call back to discuss.

## 2020-12-30 NOTE — Telephone Encounter (Signed)
Mailed lab order to pt's home address on file.

## 2020-12-30 NOTE — Telephone Encounter (Signed)
Talked to pt. Pt is going to have her labs at her PMD's office. Please mail lab order to pt. She will make an appointment for Prolia injection after she has her labs down. She is not happy to do her labs due to H/O Polio.

## 2021-01-10 ENCOUNTER — Other Ambulatory Visit (INDEPENDENT_AMBULATORY_CARE_PROVIDER_SITE_OTHER): Payer: Self-pay | Admitting: Vascular Neurology

## 2021-01-10 DIAGNOSIS — M542 Cervicalgia: Secondary | ICD-10-CM

## 2021-01-10 DIAGNOSIS — M5412 Radiculopathy, cervical region: Secondary | ICD-10-CM

## 2021-02-01 ENCOUNTER — Ambulatory Visit (INDEPENDENT_AMBULATORY_CARE_PROVIDER_SITE_OTHER): Payer: 59 | Admitting: Vascular Neurology

## 2021-02-01 ENCOUNTER — Encounter (INDEPENDENT_AMBULATORY_CARE_PROVIDER_SITE_OTHER): Payer: Self-pay | Admitting: Vascular Neurology

## 2021-02-01 ENCOUNTER — Other Ambulatory Visit (INDEPENDENT_AMBULATORY_CARE_PROVIDER_SITE_OTHER): Payer: Self-pay | Admitting: Cardiovascular Disease

## 2021-02-01 VITALS — BP 106/69 | HR 83 | Resp 16 | Ht 61.0 in | Wt 87.0 lb

## 2021-02-01 DIAGNOSIS — M542 Cervicalgia: Secondary | ICD-10-CM

## 2021-02-01 DIAGNOSIS — M5412 Radiculopathy, cervical region: Secondary | ICD-10-CM

## 2021-02-01 DIAGNOSIS — Z79899 Other long term (current) drug therapy: Secondary | ICD-10-CM

## 2021-02-01 MED ORDER — PREGABALIN 200 MG PO CAPS
200.0000 mg | ORAL_CAPSULE | Freq: Three times a day (TID) | ORAL | 5 refills | Status: DC
Start: 2021-02-01 — End: 2021-08-12

## 2021-02-01 NOTE — Patient Instructions (Signed)
Our plan:     Lyrica 200mg  three times a day.  Continue to work on smoking cessation.    Please call the office at either [phone] or [phone] to schedule a follow up appointment as discussed.    Today's Visit:      In today's visit, I reviewed your medications and records relating your health - prior testing, blood work, reports of other health care providers present in your electronic medical record.     If you have pertinent records from any non-[place] doctors that you would like to review, please have them sent to [place] or bring to them to your next office visit.     A copy of today's visit will be sent to your referring doctor and/or primary care doctor, if you have one listed in our system.    Let me know if there are things we could have done better for your office visit.    Patient satisfaction survey:      If you receive a patient satisfaction survey, I would greatly appreciate it if you would complete it. We value your feedback.     Contact me online:      Patient Portal online - Please sign up for MyChart -- this is the best way to communicate with your team here.  There is a messaging feature, where you can [place] messages anytime of day.  It is the best way to communicate with [place] and get test results, medication refills, or ask questions.     You can expect to get a response within 24-72 hours during weekdays.  If you do not receive a timely response, resend your request and inform us. My goal is for every question answered ever day.  Average response for a phone call maybe 3 days due to the volume we receive (which is why MyChart is preferred).    If you are having a medical emergency -- call 911, DO NOT SEND A MESSAGE THROUGH MYCHART.     Coupons for medication:      If you have any trouble affording your medications, check out www.goodrx.com for coupons and competitive prices in your area.  If you need further assistance, let us know so we can work with you and your insurance to make sure you get  the best care.    Thank you for trusting me with your health.      Take care,     Artemio Aly, MD  [facility]

## 2021-02-01 NOTE — Progress Notes (Signed)
Subjective:      Patient ID: Ann Patterson  CC:   Chief Complaint   Patient presents with    Follow-up     6 month follow up       HPI    The patient last saw me about a year ago, saw centimeters in August.  She says she is doing very well overall.  She continues on Lyrica 200 mg 3 times a day and this is working well for her cervicalgia, neck pain.  She feels like she has full range of motion in her neck, but does get worsened pain if she moves around a lot.    She is working on cutting down tobacco use, currently using nicotine filters which has helped her cut back on her smoking.  She feels better.      Objective:     Vitals: Reviewed. Visit Vitals  BP 106/69 (BP Site: Left arm, Patient Position: Sitting, Cuff Size: Small)   Pulse 83   Resp 16   Ht 1.549 m (5\' 1" )   Wt (!) 39.5 kg (87 lb)   BMI 16.44 kg/m     Respiratory rate appears normal.    General: WDWN, NAD. Vital signs reviewed.  HEENT: No nasal discharges, no obvious oral masses. No icterus or conjunctival hemorrhage noted.  Neck: Full ROM. No neck masses noted.  Psychiatric: Cooperative with exam, normal affect. Thought process and speech pattern normal.    NEUROLOGICAL EXAM    Mental Status: Awake, alert, oriented. Speech fluent without dysarthria. Follows commands well. Attention, memory, and concentration intact. Fund of knowledge appropriate for education.     Cranial Nerves: PERRL. Extraocular movements are full. No nystagmus seen. Facial sensation to light touch appears intact. Face is symmetric. Hearing is intact to conversational speech. Tongue midline. Cranial nerves II-XII otherwise appears intact.     Motor: Moves all extremities well, without UE drift. No obvious focal weakness noted. Bulk appears normal. No tremor noted.    Sensation: Light touch appears intact.     Coordination: No obvious dysmetria.     Gait: normal, steady      Assessment:       1. Cervicalgia    2. Neck pain    3. Cervical radiculopathy    4. Medication  management        Plan:      Continue Lyrica 200mg  TID.  Continue to work on smoking cessation.  Follow up in 6 months.      The patient will return for follow-up as outlined above. If there are any problems with medications (if prescribed) or questions about any available test results, or if there are any new symptoms or changes in current symptoms, the patient is instructed to call the office.         Lenni Reckner B. Stacy Gardner, MD  Director, [facility]  Co-Director, [facility] - [facility], Neurology  Board Certified, Sleep Medicine  Board Certified, Clinical Neurophysiology  Board Certified, Vascular Neurology        *This note was generated by the Epic EMR system/ Dragon speech recognition and may contain inherent errors or omissions not intended by the user. Grammatical errors, random word insertions, deletions, pronoun errors and incomplete sentences are occasional consequences of this technology due to software limitations. Not all errors are caught or corrected. If there are questions or concerns about the content of this note or information contained within the body of  this dictation they should be addressed directly with the author for clarification.*

## 2021-03-12 ENCOUNTER — Ambulatory Visit (INDEPENDENT_AMBULATORY_CARE_PROVIDER_SITE_OTHER): Payer: 59 | Admitting: Internal Medicine

## 2021-03-25 ENCOUNTER — Ambulatory Visit
Admission: RE | Admit: 2021-03-25 | Discharge: 2021-03-25 | Disposition: A | Discharge status: Home / Self Care | Payer: 59 | Source: Ambulatory Visit

## 2021-03-25 ENCOUNTER — Ambulatory Visit
Admission: RE | Admit: 2021-03-25 | Discharge: 2021-03-25 | Disposition: A | Discharge status: Home / Self Care | Payer: 59 | Source: Ambulatory Visit | Attending: Cardiovascular Disease | Admitting: Cardiovascular Disease

## 2021-03-25 DIAGNOSIS — M549 Dorsalgia, unspecified: Secondary | ICD-10-CM | POA: Insufficient documentation

## 2021-03-25 DIAGNOSIS — E782 Mixed hyperlipidemia: Secondary | ICD-10-CM | POA: Insufficient documentation

## 2021-03-25 DIAGNOSIS — G629 Polyneuropathy, unspecified: Secondary | ICD-10-CM | POA: Insufficient documentation

## 2021-03-25 DIAGNOSIS — Z79899 Other long term (current) drug therapy: Secondary | ICD-10-CM | POA: Insufficient documentation

## 2021-03-25 DIAGNOSIS — J302 Other seasonal allergic rhinitis: Secondary | ICD-10-CM | POA: Insufficient documentation

## 2021-03-25 DIAGNOSIS — M25551 Pain in right hip: Secondary | ICD-10-CM | POA: Insufficient documentation

## 2021-03-25 DIAGNOSIS — M810 Age-related osteoporosis without current pathological fracture: Secondary | ICD-10-CM | POA: Insufficient documentation

## 2021-03-25 DIAGNOSIS — Z0000 Encounter for general adult medical examination without abnormal findings: Secondary | ICD-10-CM | POA: Insufficient documentation

## 2021-03-25 DIAGNOSIS — I351 Nonrheumatic aortic (valve) insufficiency: Secondary | ICD-10-CM | POA: Insufficient documentation

## 2021-03-25 DIAGNOSIS — F17200 Nicotine dependence, unspecified, uncomplicated: Secondary | ICD-10-CM | POA: Insufficient documentation

## 2021-03-25 LAB — CBC AND DIFFERENTIAL
Absolute NRBC: 0 10*3/uL (ref 0.00–0.00)
Basophils Absolute Automated: 0.09 10*3/uL — ABNORMAL HIGH (ref 0.00–0.08)
Basophils Automated: 1.3 %
Eosinophils Absolute Automated: 0.11 10*3/uL (ref 0.00–0.44)
Eosinophils Automated: 1.6 %
Hematocrit: 42.7 % (ref 34.7–43.7)
Hgb: 14.3 g/dL (ref 11.4–14.8)
Immature Granulocytes Absolute: 0.02 10*3/uL (ref 0.00–0.07)
Immature Granulocytes: 0.3 %
Lymphocytes Absolute Automated: 2.87 10*3/uL (ref 0.42–3.22)
Lymphocytes Automated: 41.2 %
MCH: 31.2 pg (ref 25.1–33.5)
MCHC: 33.5 g/dL (ref 31.5–35.8)
MCV: 93 fL (ref 78.0–96.0)
MPV: 10.8 fL (ref 8.9–12.5)
Monocytes Absolute Automated: 0.69 10*3/uL (ref 0.21–0.85)
Monocytes: 9.9 %
Neutrophils Absolute: 3.18 10*3/uL (ref 1.10–6.33)
Neutrophils: 45.7 %
Nucleated RBC: 0 /100 WBC (ref 0.0–0.0)
Platelets: 212 10*3/uL (ref 142–346)
RBC: 4.59 10*6/uL (ref 3.90–5.10)
RDW: 17 % — ABNORMAL HIGH (ref 11–15)
WBC: 6.96 10*3/uL (ref 3.10–9.50)

## 2021-03-25 LAB — COMPREHENSIVE METABOLIC PANEL
ALT: 13 U/L (ref 0–55)
AST (SGOT): 25 U/L (ref 5–41)
Albumin/Globulin Ratio: 1.1 (ref 0.9–2.2)
Albumin: 3.7 g/dL (ref 3.5–5.0)
Alkaline Phosphatase: 74 U/L (ref 37–117)
Anion Gap: 5 (ref 5.0–15.0)
BUN: 9 mg/dL (ref 7.0–21.0)
Bilirubin, Total: 0.5 mg/dL (ref 0.2–1.2)
CO2: 30 mEq/L — ABNORMAL HIGH (ref 17–29)
Calcium: 10 mg/dL (ref 8.5–10.5)
Chloride: 108 mEq/L (ref 99–111)
Creatinine: 0.9 mg/dL (ref 0.4–1.0)
Globulin: 3.5 g/dL (ref 2.0–3.6)
Glucose: 94 mg/dL (ref 70–100)
Potassium: 6 mEq/L — ABNORMAL HIGH (ref 3.5–5.3)
Protein, Total: 7.2 g/dL (ref 6.0–8.3)
Sodium: 143 mEq/L (ref 135–145)

## 2021-03-25 LAB — URINALYSIS REFLEX TO MICROSCOPIC EXAM - REFLEX TO CULTURE
Bilirubin, UA: NEGATIVE
Blood, UA: NEGATIVE
Glucose, UA: NEGATIVE
Ketones UA: NEGATIVE
Leukocyte Esterase, UA: NEGATIVE
Nitrite, UA: NEGATIVE
Specific Gravity UA: 1.013 (ref 1.001–1.035)
Urine pH: 6 (ref 5.0–8.0)
Urobilinogen, UA: NORMAL mg/dL

## 2021-03-25 LAB — LIPID PANEL
Cholesterol / HDL Ratio: 4.4 Index
Cholesterol: 211 mg/dL — ABNORMAL HIGH (ref 0–199)
HDL: 48 mg/dL (ref 40–9999)
LDL Calculated: 145 mg/dL — ABNORMAL HIGH (ref 0–99)
Triglycerides: 90 mg/dL (ref 34–149)
VLDL Calculated: 18 mg/dL (ref 10–40)

## 2021-03-25 LAB — TSH: TSH: 1.12 u[IU]/mL (ref 0.35–4.94)

## 2021-03-25 LAB — PHOSPHORUS: Phosphorus: 5 mg/dL — ABNORMAL HIGH (ref 2.3–4.7)

## 2021-03-25 LAB — HEMOLYSIS INDEX
Hemolysis Index: 2 Index (ref 0–24)
Hemolysis Index: 6 Index (ref 0–24)

## 2021-03-25 LAB — T4, FREE: T4 Free: 0.96 ng/dL (ref 0.69–1.48)

## 2021-03-25 LAB — VITAMIN D,25 OH,TOTAL: Vitamin D, 25 OH, Total: 34 ng/mL (ref 30–100)

## 2021-03-25 LAB — PTH, INTACT: PTH Intact: 49.3 pg/mL (ref 17.7–84.5)

## 2021-03-25 LAB — HEMOGLOBIN A1C
Average Estimated Glucose: 99.7 mg/dL
Hemoglobin A1C: 5.1 % (ref 4.6–5.9)

## 2021-03-25 LAB — GFR: EGFR: >60

## 2021-03-26 ENCOUNTER — Telehealth: Payer: Self-pay | Admitting: Internal Medicine

## 2021-03-30 ENCOUNTER — Telehealth (INDEPENDENT_AMBULATORY_CARE_PROVIDER_SITE_OTHER): Payer: Self-pay

## 2021-03-30 NOTE — Telephone Encounter (Signed)
03/30/2021 @ 11:37 am  Called pt to discuss prolia follow up visit with Dr.Zhang. Pt did not answer and I was unable to leave a vm.   CW

## 2021-04-20 ENCOUNTER — Telehealth (INDEPENDENT_AMBULATORY_CARE_PROVIDER_SITE_OTHER): Payer: Self-pay | Admitting: Internal Medicine

## 2021-04-22 ENCOUNTER — Telehealth: Payer: Self-pay | Admitting: Internal Medicine

## 2021-04-22 NOTE — Telephone Encounter (Signed)
Contacted Pt back in regards scheduling prolia injection for tomorrow @ 11:00 am.         Pt verbalized understanding.

## 2021-04-22 NOTE — Telephone Encounter (Signed)
LVM for pt to CB.         RE: scheduling prolia injection

## 2021-04-23 ENCOUNTER — Other Ambulatory Visit (INDEPENDENT_AMBULATORY_CARE_PROVIDER_SITE_OTHER): Payer: Self-pay | Admitting: Internal Medicine

## 2021-04-23 ENCOUNTER — Ambulatory Visit (INDEPENDENT_AMBULATORY_CARE_PROVIDER_SITE_OTHER): Payer: 59

## 2021-04-23 DIAGNOSIS — M810 Age-related osteoporosis without current pathological fracture: Secondary | ICD-10-CM

## 2021-04-23 MED ORDER — DENOSUMAB 60 MG/ML SC SOSY
60.0000 mg | PREFILLED_SYRINGE | Freq: Once | SUBCUTANEOUS | Status: AC
Start: 2021-04-23 — End: 2021-04-23
  Administered 2021-04-23: 60 mg via SUBCUTANEOUS

## 2021-04-23 NOTE — Progress Notes (Signed)
Administered Prolia 60mg /3ml on pt's (L) upper arm SQ, using sterile technique, post inj site CDI, applied bandage.       NDC: 43329-518-84  LOT: [id number]  Exp: 01/24/2023

## 2021-06-01 ENCOUNTER — Other Ambulatory Visit: Payer: Self-pay

## 2021-06-01 NOTE — Telephone Encounter (Signed)
Hi Dr. Chipper Herb,    Patient due for next Prolia injection. Please approve queued order.   Thanks,  [place]

## 2021-06-02 MED ORDER — DENOSUMAB 60 MG/ML SC SOSY
60.0000 mg | PREFILLED_SYRINGE | Freq: Once | SUBCUTANEOUS | 0 refills | Status: AC
Start: 2021-06-02 — End: 2021-06-02

## 2021-06-18 ENCOUNTER — Encounter (INDEPENDENT_AMBULATORY_CARE_PROVIDER_SITE_OTHER): Payer: Self-pay | Admitting: Internal Medicine

## 2021-06-18 ENCOUNTER — Ambulatory Visit (INDEPENDENT_AMBULATORY_CARE_PROVIDER_SITE_OTHER): Payer: 59 | Admitting: Internal Medicine

## 2021-06-18 VITALS — BP 100/62 | HR 72 | Temp 97.7°F | Wt 88.3 lb

## 2021-06-18 DIAGNOSIS — M25551 Pain in right hip: Secondary | ICD-10-CM

## 2021-06-18 DIAGNOSIS — Z79899 Other long term (current) drug therapy: Secondary | ICD-10-CM

## 2021-06-18 DIAGNOSIS — M542 Cervicalgia: Secondary | ICD-10-CM

## 2021-06-18 DIAGNOSIS — M810 Age-related osteoporosis without current pathological fracture: Secondary | ICD-10-CM

## 2021-06-18 MED ORDER — DENOSUMAB 60 MG/ML SC SOSY
60.0000 mg | PREFILLED_SYRINGE | Freq: Once | SUBCUTANEOUS | Status: DC
Start: 2021-06-18 — End: 2021-06-18

## 2021-06-18 NOTE — Progress Notes (Signed)
Initial Rheumatology Consultation    Chief Complaint:     Osteoporosis       HPI:   This patient is a 71 y.o. year old female with Osteoporosis ( started Prolia in 2020) chronic smoker, DJD of C-spine and L-spine, OA of Rt hip, Polio with left leg muscle atrophy referred by Shon Baton, MD to evaluate Osteoporosis.     She has Osteoporosis.  She started Prolia in 2020 with her GYN Dr. Ralene Bathe at [facility]. She had Prolia in 03/2020. She did not followed with Dr. Ralene Bathe at [facility].     She had Prolia 60mg  injection on 04/13/2021 at [facility] No side effect.     She takes calcium 600 mg BID and Vit D 2000 IU daily. She does walking exercise 30 minutes and does yard work. She had no history of cancer, chemotherapy or radiation therapy.    The patient's risk factors for osteoporosis include:  Had hysterectomy without hormone replacement therapy  History of fragility fracture of   at age of  due to fall  No Family history of osteoporosis and hip fracture  Current smoker: half pack day, smoke for  50 years  No Alcohol usages drinks per day  She has weight loss 20 lbs in the past over 10 yrs. Her weight is about 88 lbs.         DXA bone Density study on 12/18/2020:   The AP Spine BMD=  g/cm2, T-Score= -0.0  The left femoral neck BMD=  g/cm2, T-Score= -1.9  The left hip BMD= g/cm2, T-Score=-2.6     Compared to study done 2021  there is no significant changes at the spine and a significant increase at the hip of 4.1%.    DXA bone Density study on 07/19/19:   The AP Spine BMD=  g/cm2, T-Score= -0.1  The left femoral neck BMD=  g/cm2, T-Score= -2.2  The left hip BMD= g/cm2, T-Score=-2.8     24% increase from baseline in 2012. Please note that lumbar spine BMD values may  be falsely elevated due to sclerosis.    She has back pain.         PMSH:     Past Medical History:   Diagnosis Date    Headache     Hyperlipidemia     Low back pain     Meningitis spinal 1956    spinal and "regular    Neuropathy     upper c spine issues, +  carpal tunnel symptoms bilaterally    Pain     Pneumonia 2015    Polio     Polio 1956    Vertigo        Past Surgical History:   Procedure Laterality Date    APPENDECTOMY (OPEN)      71 years of age    BACK SURGERY  2005    plate in her neck-    CARPAL TUNNEL RELEASE      bilateral hands 1994 and 1996    EXTRACTION, CATARACT, PHACO, IOL Left 02/28/2019    Procedure: EXTRACTION, CATARACT, PHACO, IOL;  Surgeon: Belinda Block, MD;  Location: [facility] OR;  Service: Ophthalmology;  Laterality: Left;  LEFT  EYE PHACO WITH IOL    EXTRACTION, CATARACT, PHACO, IOL Right 03/14/2019    Procedure: EXTRACTION, CATARACT, PHACO, IOL;  Surgeon: Belinda Block, MD;  Location: [facility];  Service: Ophthalmology;  Laterality: Right;  RIGHT  EYE PHACO WITH IOL  FRACTURE SURGERY Left     at 71 years of age    HYSTERECTOMY      1995    ORIF, FINGER Left 03/02/2015    Procedure: ORIF, FINGER;  Surgeon: Kellie Simmering, MD;  Location: [facility] OR;  Service: Plastics;  Laterality: Left;  LEFT SMALL FINGER METACARPAL ORIF    REPAIR, HAND, NERVE  12/10/2012    Procedure: REPAIR, HAND, NERVE;  Surgeon: Kellie Simmering, MD;  Location: [facility];  Service: Plastics;  Laterality: Left;  LEFT SMALL FINGER EXPLORATION; REPAIR DIGITAL NERVE    REPAIR, LIGAMENT Left 03/02/2015    Procedure: REPAIR, LIGAMENT;  Surgeon: Kellie Simmering, MD;  Location: [facility] OR;  Service: Plastics;  Laterality: Left;  LEFT MIDDLE FINGER RADIAL COLLATERAL LIGAMENT REPAIR       Allergies:     Allergies   Allergen Reactions    Gadolinium      Heart fluttering    Iodine      Heart fluttering    Penicillins Hives       Meds:     Current Outpatient Medications:     Ascorbic Acid (VITAMIN C PO), Take 1 tablet by mouth, Disp: , Rfl:     betamethasone dipropionate (DIPROLENE) 0.05 % cream, Apply topically 2 (two) times daily (Patient not taking: Reported on 02/01/2021), Disp: 30 g, Rfl: 0    fluticasone (FLONASE) 50 MCG/ACT nasal spray, APPLY ONE  SPRAY IN EACH NOSTRIL EVERY DAY, Disp: , Rfl:     loratadine (Claritin) 10 MG tablet, Take 1 tablet (10 mg total) by mouth daily, Disp: 30 tablet, Rfl: 0    oxybutynin XL (DITROPAN-XL) 5 MG 24 hr tablet, Take 5 mg by mouth daily, Disp: , Rfl:     pregabalin (Lyrica) 200 MG capsule, Take 1 capsule (200 mg total) by mouth 3 (three) times daily, Disp: 90 capsule, Rfl: 5    raloxifene (EVISTA) 60 MG tablet, Take 1 tablet (60 mg total) by mouth daily., Disp: 30 tablet, Rfl: 3    FH:     Family History   Problem Relation Age of Onset    Myocardial Infarction Mother     Diverticulitis Sister     Diabetes Maternal Grandmother     Brain cancer Maternal Grandfather     Breast cancer Neg Hx        SH:     Social History     Socioeconomic History    Marital status: Single   Tobacco Use    Smoking status: Every Day     Packs/day: 0.50     Years: 30.00     Pack years: 15.00     Types: Cigarettes    Smokeless tobacco: Never   Vaping Use    Vaping Use: Never used   Substance and Sexual Activity    Alcohol use: Yes     Comment: social  wine    Drug use: Not Currently     Frequency: 2.0 times per week     Types: Marijuana     Comment: uses fro pain rellief  will hold use 02/28/15       ROS:   All other systems reviewed and negative except as described above.        PHYSICAL EXAM:   There were no vitals filed for this visit.    General appearance - alert, well appearing, and in no distress  Mental status - alert, oriented to person, place, and time  Eyes - extraocular eye  movements intact  Back exam - + tenderness of para L-spine  Neurological - alert, oriented, normal speech, no focal findings or movement disorder noted  Extremities - peripheral pulses normal, no pedal edema, no clubbing or cyanosis  Skin - normal coloration and turgor, no rashes, no suspicious skin lesions noted  Musculoskeletal - No tenderness of MCPs and PIPs. Good range of motion of shoulders, elbows, wrists and small joints of hands. Good range of motion of hips,  knees and ankles.  Knees crepitus with range of motion of knees without effusions. Ankles without effusions.  No tenderness of MTPs or effusions.   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             Labs:     Component      Latest Ref Rng & Units 03/18/2018   WBC      3.10 - 9.50 x10 3/uL 9.24   Hemoglobin      11.4 - 14.8 g/dL 16.1   Hematocrit      34.7 - 43.7 % 36.1   Platelet Count      142 - 346 x10 3/uL 206   RBC      3.90 - 5.10 x10 6/uL 4.09   MCV      78.0 - 96.0 fL 88.3   MCH      25.1 - 33.5 pg 29.8   MCHC      31.5 - 35.8 g/dL 09.6   RDW      11 - 15 % 19 (H)   MPV      8.9 - 12.5 fL 10.2   Neutrophils      None % 45.9   Lymphocytes Automated      None % 41.5   Monocytes      None % 9.6   Eosinophils Automated      None % 2.1   Basophils Automated      None % 0.6   Immature Granulocytes      None % 0.3   Nucleated RBC      0.0 - 0.0 /100 WBC 0.0   Neutrophils Absolute      1.10 - 6.33 x10 3/uL 4.24   Lymphocytes Absolute Automated      0.42 - 3.22 x10 3/uL 3.83 (H)   Monocytes Absolute Automated      0.21 - 0.85 x10 3/uL 0.89 (H)   Eosinophils Absolute Automated      0.00 - 0.44 x10 3/uL 0.19   Basophils Absolute Automated      0.00 - 0.08 x10 3/uL 0.06   Immature Granulocytes Absolute      0.00 - 0.07 x10 3/uL 0.03   Nucleated RBC Absolute      0.00 - 0.00 x10 3/uL 0.00   Glucose      70 - 100 mg/dL 93   BUN      7.0 - 04.5 mg/dL 40.9   Creatinine      0.6 - 1.0 mg/dL 0.8   Calcium      8.5 - 10.5 mg/dL 8.9   Sodium      811 - 145 mEq/L 141   Potassium      3.5 - 5.1 mEq/L 3.7   Chloride      100 - 111 mEq/L 106   CO2      22 - 29 mEq/L 22       Radiology:     DXA bone density axial skeleton [IMG572] (Order [id number])  Status: Final result     Study Result    Narrative & Impression   INDICATION: Osteoporosis screening.  Menopause     INTERPRETATION:  DEXA methodology was utilized to evaluate the bone  mineral density.  The measurements are as follows:     PRIOR: 04/25/2011     Left hip- total:  BMD:   0.604 g/cm2.  T-score:  -2.8  Z- score: -1.4  7.3% increase from baseline.     Left femoral neck:  BMD: 0.610 g/cm/2  T-score: -2.2  Z- score: -0.5     Spine (L1-L4):  BMD:  1.032 g/cm2.  T-score:  -0.1  Z- score: 1.7  24% increase from baseline. Please note that lumbar spine BMD values may  be falsely elevated due to sclerosis.        IMPRESSION:    Osteoporosis of the left hip. Osteopenia of the left femoral  neck.       ASSESSMENT:   Ann Patterson is a 71 y.o. female with Osteoporosis ( started Prolia in 2020) chronic smoker, DJD of C-spine and L-spine, OA of Rt hip, Polio with left leg muscle atrophy.    She has Osteoporosis and had Prolia in 12/21 and in 03/2021.       PLAN:     1. Order CMP  2. Suggest Prolia 60 mg SQ every 6 months in 09/2021   3. Suggest calcium 600 mg bid and Vit D 1000 IU bid   4. Weight bearing exercise  5. Suggest Tylenol as needed   6. Stop smoking and gain weight   7. Follow 6 months or early

## 2021-06-18 NOTE — Patient Instructions (Signed)
1. Order CMP  2. Suggest Prolia 60 mg SQ every 6 months in 09/2021   3. Suggest calcium 600 mg bid and Vit D 1000 IU bid   4. Weight bearing exercise  5. Suggest Tylenol as needed   6. Stop smoking and gain weight   7. Follow 6 months or early

## 2021-06-30 ENCOUNTER — Ambulatory Visit
Admission: RE | Admit: 2021-06-30 | Discharge: 2021-06-30 | Disposition: A | Discharge status: Home / Self Care | Payer: 59 | Source: Ambulatory Visit | Attending: Cardiovascular Disease | Admitting: Cardiovascular Disease

## 2021-06-30 DIAGNOSIS — E782 Mixed hyperlipidemia: Secondary | ICD-10-CM | POA: Insufficient documentation

## 2021-06-30 DIAGNOSIS — E875 Hyperkalemia: Secondary | ICD-10-CM | POA: Insufficient documentation

## 2021-06-30 LAB — BASIC METABOLIC PANEL
Anion Gap: 4 — ABNORMAL LOW (ref 5.0–15.0)
BUN: 10 mg/dL (ref 7.0–21.0)
CO2: 32 mEq/L — ABNORMAL HIGH (ref 17–29)
Calcium: 9.1 mg/dL (ref 8.5–10.5)
Chloride: 105 mEq/L (ref 99–111)
Creatinine: 0.8 mg/dL (ref 0.4–1.0)
Glucose: 96 mg/dL (ref 70–100)
Potassium: 5.1 mEq/L (ref 3.5–5.3)
Sodium: 141 mEq/L (ref 135–145)

## 2021-06-30 LAB — HEMOLYSIS INDEX: Hemolysis Index: 6 Index (ref 0–24)

## 2021-06-30 LAB — LIPID PANEL
Cholesterol / HDL Ratio: 3.1 Index
Cholesterol: 173 mg/dL (ref 0–199)
HDL: 55 mg/dL (ref 40–9999)
LDL Calculated: 96 mg/dL (ref 0–99)
Triglycerides: 112 mg/dL (ref 34–149)
VLDL Calculated: 22 mg/dL (ref 10–40)

## 2021-06-30 LAB — GFR: EGFR: >60

## 2021-07-14 ENCOUNTER — Telehealth: Payer: Self-pay | Admitting: Internal Medicine

## 2021-07-14 NOTE — Telephone Encounter (Signed)
LVM for patient to call back regarding questions she has for her Prolia.

## 2021-07-16 ENCOUNTER — Other Ambulatory Visit (INDEPENDENT_AMBULATORY_CARE_PROVIDER_SITE_OTHER): Payer: Self-pay | Admitting: Internal Medicine

## 2021-07-16 DIAGNOSIS — M810 Age-related osteoporosis without current pathological fracture: Secondary | ICD-10-CM

## 2021-07-16 DIAGNOSIS — M8000XA Age-related osteoporosis with current pathological fracture, unspecified site, initial encounter for fracture: Secondary | ICD-10-CM

## 2021-07-16 MED ORDER — PROLIA 60 MG/ML SC SOSY
60.0000 mg | PREFILLED_SYRINGE | Freq: Once | SUBCUTANEOUS | 0 refills | Status: DC
Start: 2021-07-16 — End: 2022-09-14

## 2021-07-16 NOTE — Telephone Encounter (Signed)
Ordered Prolia and labs. She can come in June for Prolia injection. Please call pt. Thanks.

## 2021-07-16 NOTE — Telephone Encounter (Signed)
Left message for patient to call office back regarding Prolia.

## 2021-07-19 ENCOUNTER — Telehealth: Payer: Self-pay

## 2021-07-19 NOTE — Telephone Encounter (Signed)
Prolia- Released MSOT order to [facility] for fill, will begin call attempts late May since patient is trying to get apt in June.

## 2021-07-19 NOTE — Telephone Encounter (Signed)
2nd attempt.  Left message for patient to call office back.

## 2021-08-02 ENCOUNTER — Encounter (INDEPENDENT_AMBULATORY_CARE_PROVIDER_SITE_OTHER): Payer: Self-pay | Admitting: Vascular Neurology

## 2021-08-09 ENCOUNTER — Ambulatory Visit: Payer: 59 | Admitting: Vascular Neurology

## 2021-08-12 ENCOUNTER — Ambulatory Visit: Payer: 59 | Admitting: Family Nurse Practitioner

## 2021-08-12 ENCOUNTER — Ambulatory Visit: Payer: Medicare Other | Admitting: Vascular Neurology

## 2021-08-12 ENCOUNTER — Other Ambulatory Visit: Payer: Self-pay

## 2021-08-12 ENCOUNTER — Other Ambulatory Visit: Payer: Self-pay | Admitting: Family

## 2021-08-12 ENCOUNTER — Other Ambulatory Visit (INDEPENDENT_AMBULATORY_CARE_PROVIDER_SITE_OTHER): Payer: Self-pay | Admitting: Vascular Neurology

## 2021-08-12 ENCOUNTER — Telehealth: Payer: Self-pay | Admitting: Vascular Neurology

## 2021-08-12 DIAGNOSIS — M542 Cervicalgia: Secondary | ICD-10-CM

## 2021-08-12 DIAGNOSIS — M5412 Radiculopathy, cervical region: Secondary | ICD-10-CM

## 2021-08-12 NOTE — Telephone Encounter (Signed)
Patient will need refill on Lyrica 200 mg 3 times a day she is currently out of the medication she also has a follow up appt scheduled.     Please contact with [phone]

## 2021-08-13 ENCOUNTER — Telehealth: Payer: Self-pay

## 2021-08-13 NOTE — Telephone Encounter (Signed)
RECEIVED [employer] APPROVAL. SCANNED IN MEDIA MANAGER. CALLED PT TO NOTIFY, NO ANSWER, LEFT MESSAGE.

## 2021-08-13 NOTE — Telephone Encounter (Signed)
After being advised from call center about patient needing lyrica rx, received another message from call center stating patient is upset bc she also requires a new PA for the rx.   Submitted PA through Medicare/[employer] [facility] at [phone]  Reference #[id number]  Insurance agent Curlene Labrum stated PA turnaround time is 72 hours.     Called patient to notify of PA. Patient yelled at me telling me she is going to come to the clinic and get her meds from [place]. I explained to patient that the neurology clinic does not dispense medications and if she would like to expedite her PA, she should request an expedited decision from her insurance. At the moment I was going to provide the PA telephone #, pt yelled, "you're not getting off the line that god damn easy".   I ended the phone call.

## 2021-08-26 ENCOUNTER — Ambulatory Visit: Payer: 59 | Attending: Family Nurse Practitioner | Admitting: Family Nurse Practitioner

## 2021-08-26 VITALS — BP 120/75 | HR 100 | Ht 61.25 in | Wt 93.0 lb

## 2021-08-26 DIAGNOSIS — M5412 Radiculopathy, cervical region: Secondary | ICD-10-CM | POA: Insufficient documentation

## 2021-08-26 DIAGNOSIS — Z79899 Other long term (current) drug therapy: Secondary | ICD-10-CM | POA: Insufficient documentation

## 2021-08-26 DIAGNOSIS — M542 Cervicalgia: Secondary | ICD-10-CM | POA: Insufficient documentation

## 2021-08-26 DIAGNOSIS — M62838 Other muscle spasm: Secondary | ICD-10-CM | POA: Insufficient documentation

## 2021-08-26 NOTE — Progress Notes (Signed)
08/26/2021    RE:  Ann Patterson  PCP:  Shon Baton, MD     Chief Complaint: follow up    History of Present Illness:  Ann Patterson is a 71 y.o. female who is here for her follow up. Has been using lyrica 200mg  TID which controls her neck pain. She has been having some issues with muscle pain in the right arm. She denies any recent trauma. Its throbbing on the neck from the muscle, but doesn't feel like the pinch nerve. She would like to avoid needles, has not seen pain clinic or had pain injections.     Review of Systems:    ROS as noted in HPI or BOLD:      Weight loss gain  Heartburn Ulcer Pain joint arm leg hand foot    Fever Tic bite Swallowing problems Left Right Weak arm leg hand foot all over   Night sweats Hearing problems tinnitus Left Right numbness and tingling arm leg hand foot   Rash Arm leg face Weakness Back Pain Neck Pain   Chest pain SOB Asthma   Stroke like spell Fatigue Daytime sleepiness Headache Facial Pain LOC Altered consciousness   Seizure like spell   Abdominal pain Diplopia Dizziness Sleep problems OSA EDS   Change in Bowels Eye pain Vision Change Anxiety Stress Depression   Hematuria Incontinence  Hoarseness Speech change Word finding difficulty   Nausea Vomit Diarrhea  Poor balance unsteady Falls close call Memory loss Cognitive issues       Past Medical History:  Past Medical History:   Diagnosis Date    Headache     Hyperlipidemia     Low back pain     Meningitis spinal 1956    spinal and "regular    Neuropathy     upper c spine issues, + carpal tunnel symptoms bilaterally    Pain     Pneumonia 2015    Polio     Polio 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    Vertigo        Past Surgical History:  Past Surgical History:   Procedure Laterality Date    APPENDECTOMY (OPEN)      71 years of age    BACK SURGERY  2005    plate in her neck-    CARPAL TUNNEL RELEASE      bilateral hands 1994 and 1996    EXTRACTION, CATARACT, PHACO, IOL Left 02/28/2019    Procedure: EXTRACTION, CATARACT, PHACO, IOL;  Surgeon:  Belinda Block, MD;  Location: [facility] OR;  Service: Ophthalmology;  Laterality: Left;  LEFT  EYE PHACO WITH IOL    EXTRACTION, CATARACT, PHACO, IOL Right 03/14/2019    Procedure: EXTRACTION, CATARACT, PHACO, IOL;  Surgeon: Belinda Block, MD;  Location: [facility] SURGERY OR;  Service: Ophthalmology;  Laterality: Right;  RIGHT  EYE PHACO WITH IOL    FRACTURE SURGERY Left     at 71 years of age    HYSTERECTOMY      1995    ORIF, FINGER Left 03/02/2015    Procedure: ORIF, FINGER;  Surgeon: Kellie Simmering, MD;  Location: [facility] OR;  Service: Plastics;  Laterality: Left;  LEFT SMALL FINGER METACARPAL ORIF    REPAIR, HAND, NERVE  12/10/2012    Procedure: REPAIR, HAND, NERVE;  Surgeon: Kellie Simmering, MD;  Location: [facility];  Service: Plastics;  Laterality: Left;  LEFT SMALL FINGER EXPLORATION; REPAIR DIGITAL NERVE    REPAIR, LIGAMENT Left 03/02/2015    Procedure:  REPAIR, LIGAMENT;  Surgeon: Kellie Simmering, MD;  Location: [facility] OR;  Service: Plastics;  Laterality: Left;  LEFT MIDDLE FINGER RADIAL COLLATERAL LIGAMENT REPAIR       Allergies:  Gadolinium, Iodine, and Penicillins    Family History:  Family History   Problem Relation Age of Onset    Myocardial Infarction Mother     Diverticulitis Sister     Diabetes Maternal Grandmother     Brain cancer Maternal Grandfather     Breast cancer Neg Hx      No other family history related to current complaints.    Social History:  Social History     Tobacco Use    Smoking status: Every Day     Packs/day: 0.50     Years: 30.00     Pack years: 15.00     Types: Cigarettes    Smokeless tobacco: Never   Vaping Use    Vaping status: Never Used   Substance Use Topics    Alcohol use: Yes     Comment: social  wine    Drug use: Not Currently     Frequency: 2.0 times per week     Types: Marijuana     Comment: uses fro pain rellief  will hold use 02/28/15       Medications:  Current Outpatient Medications   Medication Sig Dispense Refill    fluticasone (FLONASE) 50  MCG/ACT nasal spray APPLY ONE SPRAY IN EACH NOSTRIL EVERY DAY      loratadine (Claritin) 10 MG tablet Take 1 tablet (10 mg total) by mouth daily 30 tablet 0    Lyrica 200 MG capsule TAKE ONE CAPSULE BY MOUTH THREE TIMES A DAY 90 capsule 5    oxybutynin XL (DITROPAN-XL) 5 MG 24 hr tablet Take 5 mg by mouth daily      pravastatin (PRAVACHOL) 20 MG tablet Take 20 mg by mouth nightly      raloxifene (EVISTA) 60 MG tablet Take 1 tablet (60 mg total) by mouth daily. 30 tablet 3     No current facility-administered medications for this visit.       General Exam:  General: The patient was well developed and well nourished.  No acute distress.  Extremities: no pedal edema, extremities normal in color  Mental Status: The patient was awake, alert and oriented X4.  Normal affect.  Recent and remote memory appeared to be normal.   Attention span and concentration appear normal.  Fluent without aphasia.   Registers 3/3 and recalls 3/3 after 5 minutes.  Cranial nerves: Pupils are equal, round and reactive to light.  No nystagmus.  Facial sensation intact.   Face symmetric.  No dysarthria.  Hearing grossly intact.  Shoulder shrug symmetric.    Motor: Muscle tone normal. No atrophy.  No fasiculations.   Coordination: No tremors  Gait: Stable. Good stride length, good initiation.     Assessment:    - Neck pain/cervicalgia: improved with lyrica 200mg  TID  - creatinine clearance calculated to be 42-45 (using previous and todays weight), per uptodate lyrica should be maxed 300 mg/day  - She would benefit from local injections  - would avoid duloxetine due osteoporosis and risk duloxetine can contribute long term   - New muscle spasms/pain: likely r/t cervicalgia, re-assuring no other focal deficits   - She is concerned about stopping the lyrica and became increasingly upset as discussion about reducing lyrica to ensure safe kidney dosing. We also discussed pain clinic for potentially injections  but again pt increasingly upset about this  practitioner's experience. She wishes to avoid injections due to needles, which is understandable, but again her best management team would be pain clinic given lyrica kidney dosing which would prohibit gabapentin addition and duloxetine (osteoporosis risk). She is declining recommendations until she sees Dr. Stacy Gardner to discuss. Discussed that I cannot fill her lyrica 200mg  TID as this is not safe for pt due to kidney function. I have also reached out to Dr. Stacy Gardner to discuss and agrees with pain clinic is best place for pt to continue pain management care     Plan:    - recommend change to lyrica 100mg  TID  - referral to [facility], please consider local injections as this helps avoid systemic risks   - f/u dr. Stacy Gardner in one month      In today's visit, I reviewed your medications and records relating your health - prior testing, blood work, reports of other health care providers present in your electronic medical record.     If you have pertinent records from any non-[place] doctors that you would like to review, please have them sent to [place] or bring to them to your next office visit.     A copy of today's visit will be sent to your referring doctor and/or primary care doctor, if you have one listed in our system.    Let me know if there are things we could have done better for your office visit.    Patient satisfaction survey:      If you receive a patient satisfaction survey, I would greatly appreciate it if you would complete it. We value your feedback.     Contact me online:      Patient Portal online - Please sign up for MyChart -- this is the best way to communicate with your team here.  There is a messaging feature, where you can [place] messages anytime of day.  It is the best way to communicate with [place] and get test results, medication refills, or ask questions.     You can expect to get a response within 24-48 hours during weekdays.  If you do not receive a timely response, resend your request and inform us.  My goal is for every question answered ever day.  Average response for a phone call maybe 3 days due to the volume we receive (which is why MyChart is preferred).    If you are having a medical emergency -- call 911, DO NOT SEND A MESSAGE THROUGH MYCHART.     Coupons for medication:      If you have any trouble affording your medications, check out www.goodrx.com for coupons and competitive prices in your area.  If you need further assistance, let us know so we can work with you and your insurance to make sure you get the best care.    Thank you for trusting me with your health.      Most recent reports of labs/imaging reviewed      A total of 30 minutes were spent on patient care, including but not limited to reviewing pt chart/reviewing test results, documenting in patient medical record, placing and reviewing orders, communication w/ other healthcare providers, performing physical exam, and face-to-face with the patient during the encounter w/ over half the time spent on counseling/education and coordination of care.    Dr Stacy Gardner available in a supervisory capacity.     Kerrin Champagne MSN FNP  Family Nurse Practitioner  [facility] for [facility]   [address]  [place], [place] [address]

## 2021-09-08 ENCOUNTER — Telehealth: Payer: Self-pay | Admitting: Vascular Neurology

## 2021-09-08 ENCOUNTER — Telehealth: Payer: Self-pay

## 2021-09-08 NOTE — Telephone Encounter (Signed)
Patient called about her Lyrica prescription, she states the pharmacy told her it had been cancelled. Chart review shows recommended reduction in Lyrica regimen to 100mg  TID, new prescription needed. Will route to provider for review.

## 2021-09-08 NOTE — Telephone Encounter (Signed)
Patient calling to speak with Dr. Stacy Gardner about Lyrica and changes being made to it's strength.     Please call her as you get a chance.       Thanks,  [employer]

## 2021-09-09 ENCOUNTER — Encounter: Payer: Self-pay | Admitting: Family Nurse Practitioner

## 2021-09-09 NOTE — Progress Notes (Signed)
Reached out to PCP to discuss pt kidney function.     creatinine clearance calculated to be 42-45  eGFR >60 om 06/30/2021    PCP Din, is ok with pt using lyrica 200mg  TID and has no issues with current kidney function     Will discuss with dr. Stacy Gardner.

## 2021-09-10 ENCOUNTER — Other Ambulatory Visit (INDEPENDENT_AMBULATORY_CARE_PROVIDER_SITE_OTHER): Payer: Self-pay | Admitting: Vascular Neurology

## 2021-09-10 ENCOUNTER — Other Ambulatory Visit: Payer: Self-pay | Admitting: Family Nurse Practitioner

## 2021-09-10 DIAGNOSIS — M542 Cervicalgia: Secondary | ICD-10-CM

## 2021-09-10 DIAGNOSIS — M5412 Radiculopathy, cervical region: Secondary | ICD-10-CM

## 2021-09-10 MED ORDER — PREGABALIN 200 MG PO CAPS
200.0000 mg | ORAL_CAPSULE | Freq: Three times a day (TID) | ORAL | 2 refills | Status: DC
Start: 2021-09-10 — End: 2021-09-10

## 2021-09-10 NOTE — Progress Notes (Signed)
After discussion w/ pcp who is ok w./ pt kidney function and confirmed with Dr. Stacy Gardner. Will continue lyrica 200mg  TID

## 2021-09-13 ENCOUNTER — Ambulatory Visit
Admission: RE | Admit: 2021-09-13 | Discharge: 2021-09-13 | Disposition: A | Discharge status: Home / Self Care | Payer: 59 | Source: Ambulatory Visit | Attending: Internal Medicine | Admitting: Internal Medicine

## 2021-09-13 DIAGNOSIS — M810 Age-related osteoporosis without current pathological fracture: Secondary | ICD-10-CM | POA: Insufficient documentation

## 2021-09-13 DIAGNOSIS — Z79899 Other long term (current) drug therapy: Secondary | ICD-10-CM | POA: Insufficient documentation

## 2021-09-13 DIAGNOSIS — M25551 Pain in right hip: Secondary | ICD-10-CM | POA: Insufficient documentation

## 2021-09-13 DIAGNOSIS — M542 Cervicalgia: Secondary | ICD-10-CM | POA: Insufficient documentation

## 2021-09-13 LAB — COMPREHENSIVE METABOLIC PANEL
ALT: 12 U/L (ref 0–55)
AST (SGOT): 30 U/L (ref 5–41)
Albumin/Globulin Ratio: 1.1 (ref 0.9–2.2)
Albumin: 3.9 g/dL (ref 3.5–5.0)
Alkaline Phosphatase: 51 U/L (ref 37–117)
Anion Gap: 9 (ref 5.0–15.0)
BUN: 11 mg/dL (ref 7.0–21.0)
Bilirubin, Total: 0.6 mg/dL (ref 0.2–1.2)
CO2: 25 mEq/L (ref 17–29)
Calcium: 9.2 mg/dL (ref 8.5–10.5)
Chloride: 105 mEq/L (ref 99–111)
Creatinine: 0.8 mg/dL (ref 0.4–1.0)
Globulin: 3.6 g/dL (ref 2.0–3.6)
Glucose: 121 mg/dL — ABNORMAL HIGH (ref 70–100)
Potassium: 5.2 mEq/L (ref 3.5–5.3)
Protein, Total: 7.5 g/dL (ref 6.0–8.3)
Sodium: 139 mEq/L (ref 135–145)
eGFR: >60 mL/min/{1.73_m2} (ref >=60)

## 2021-09-13 LAB — HEMOLYSIS INDEX: Hemolysis Index: 10 Index (ref 0–24)

## 2021-09-29 ENCOUNTER — Other Ambulatory Visit: Payer: Self-pay | Admitting: Obstetrics & Gynecology

## 2021-09-29 DIAGNOSIS — Z1239 Encounter for other screening for malignant neoplasm of breast: Secondary | ICD-10-CM

## 2021-10-04 ENCOUNTER — Telehealth: Payer: Self-pay

## 2021-10-04 NOTE — Telephone Encounter (Signed)
Noted  

## 2021-10-11 NOTE — Telephone Encounter (Signed)
Rec'vd prolia in office.    Ordered under (SP).    Contacted patient to inform prolia in office. OR    Patient has in person appointment scheduled on 10/18/2021 and will administer then.

## 2021-10-18 ENCOUNTER — Encounter (INDEPENDENT_AMBULATORY_CARE_PROVIDER_SITE_OTHER): Payer: Self-pay | Admitting: Internal Medicine

## 2021-10-18 ENCOUNTER — Ambulatory Visit (INDEPENDENT_AMBULATORY_CARE_PROVIDER_SITE_OTHER): Payer: 59 | Admitting: Internal Medicine

## 2021-10-18 VITALS — BP 103/57 | HR 91 | Wt 90.0 lb

## 2021-10-18 DIAGNOSIS — M5412 Radiculopathy, cervical region: Secondary | ICD-10-CM

## 2021-10-18 DIAGNOSIS — Z79899 Other long term (current) drug therapy: Secondary | ICD-10-CM

## 2021-10-18 DIAGNOSIS — M810 Age-related osteoporosis without current pathological fracture: Secondary | ICD-10-CM

## 2021-10-18 DIAGNOSIS — F17200 Nicotine dependence, unspecified, uncomplicated: Secondary | ICD-10-CM

## 2021-10-18 MED ORDER — DENOSUMAB 60 MG/ML SC SOSY
60.0000 mg | PREFILLED_SYRINGE | Freq: Once | SUBCUTANEOUS | Status: AC
Start: 2021-10-18 — End: 2021-10-18
  Administered 2021-10-18: 60 mg via SUBCUTANEOUS

## 2021-10-18 NOTE — Progress Notes (Signed)
Patient arrived to clinic for scheduled Prolia injection.  Physician order in Epic.  Medication was removed from refrigerator 30 minutes prior to injection.  Patient brought to room, name and DOB verified, patient made ware of medication given.  Medication verified with MD.  Injection administered per 6 rights of medication administration.  Patient waited 15 minutes after administration, no adverse reactions noted.    Administered Prolia 60mg/ml subcutaneously to patient's upper Left arm.    NDC: 55513-710-01  LOT: [id number]  EXP: 12/24/2023

## 2021-10-18 NOTE — Progress Notes (Signed)
Initial Rheumatology Consultation    Chief Complaint:     Osteoporosis       HPI:   This patient is a 71 y.o. year old female with Osteoporosis ( started Prolia in 2020) chronic smoker, DJD of C-spine and L-spine, OA of Rt hip, Polio with left leg muscle atrophy referred by Shon Baton, MD to evaluate Osteoporosis.     She has Osteoporosis.  She started Prolia in 2020 with her GYN Dr. Ralene Bathe at [facility]. She had Prolia in 03/2020. She did not followed with Dr. Ralene Bathe at [facility].     She had Prolia 60mg  injection on 04/13/2021 at [facility]. No side effect.     She does not take calcium 600 mg BID and Vit D. She does walking exercise 30 minutes and does yard work. She had no history of cancer, chemotherapy or radiation therapy.    The patient's risk factors for osteoporosis include:  Had hysterectomy without hormone replacement therapy  History of fragility fracture of   at age of  due to fall  No Family history of osteoporosis and hip fracture  Current smoker: half pack day, smoke for  50 years  No Alcohol usages drinks per day  She has weight loss 20 lbs in the past over 10 yrs. Her weight is about 90 lbs.         DXA bone Density study on 12/18/2020:   The AP Spine BMD=  g/cm2, T-Score= -0.0  The left femoral neck BMD=  g/cm2, T-Score= -1.9  The left hip BMD= g/cm2, T-Score=-2.6     Compared to study done 2021  there is no significant changes at the spine and a significant increase at the hip of 4.1%.    DXA bone Density study on 07/19/19:   The AP Spine BMD=  g/cm2, T-Score= -0.1  The left femoral neck BMD=  g/cm2, T-Score= -2.2  The left hip BMD= g/cm2, T-Score=-2.8     24% increase from baseline in 2012. Please note that lumbar spine BMD values may  be falsely elevated due to sclerosis.    She has back pain.         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    Vertigo        Past Surgical History:   Procedure Laterality Date    APPENDECTOMY (OPEN)      71 years of age    BACK SURGERY  2005    plate in her neck-    CARPAL TUNNEL RELEASE      bilateral hands 1994 and 1996    EXTRACTION, CATARACT, PHACO, IOL Left 02/28/2019    Procedure: EXTRACTION, CATARACT, PHACO, IOL;  Surgeon: Belinda Block, MD;  Location: [facility] OR;  Service: Ophthalmology;  Laterality: Left;  LEFT  EYE PHACO WITH IOL    EXTRACTION, CATARACT, PHACO, IOL Right 03/14/2019    Procedure: EXTRACTION, CATARACT, PHACO, IOL;  Surgeon: Belinda Block, MD;  Location: [facility] OR;  Service: Ophthalmology;  Laterality: Right;  RIGHT  EYE PHACO WITH IOL  FRACTURE SURGERY Left     at 71 years of age    HYSTERECTOMY      1995    ORIF, FINGER Left 03/02/2015    Procedure: ORIF, FINGER;  Surgeon: Kellie Simmering, MD;  Location: [facility] OR;  Service: Plastics;  Laterality: Left;  LEFT SMALL FINGER METACARPAL ORIF    REPAIR, HAND, NERVE  12/10/2012    Procedure: REPAIR, HAND, NERVE;  Surgeon: Kellie Simmering, MD;  Location[facility];  Service: Plastics;  Laterality: Left;  LEFT SMALL FINGER EXPLORATION; REPAIR DIGITAL NERVE    REPAIR, LIGAMENT Left 03/02/2015    Procedure: REPAIR, LIGAMENT;  Surgeon: Kellie Simmering, MD;  Location: [facility] OR;  Service: Plastics;  Laterality: Left;  LEFT MIDDLE FINGER RADIAL COLLATERAL LIGAMENT REPAIR       Allergies:     Allergies   Allergen Reactions    Gadolinium      Heart fluttering    Iodine      Heart fluttering    Penicillins Hives       Meds:     Current Outpatient Medications:     fluticasone (FLONASE) 50 MCG/ACT nasal spray, APPLY ONE SPRAY IN EACH NOSTRIL EVERY DAY, Disp: , Rfl:     loratadine (Claritin) 10 MG tablet, Take 1 tablet (10 mg total) by mouth daily, Disp: 30 tablet, Rfl: 0    Lyrica 200 MG capsule, TAKE ONE CAPSULE BY MOUTH THREE TIMES A DAY, Disp: 90  capsule, Rfl: 5    oxybutynin XL (DITROPAN-XL) 5 MG 24 hr tablet, Take 5 mg by mouth daily, Disp: , Rfl:     pravastatin (PRAVACHOL) 20 MG tablet, Take 20 mg by mouth nightly, Disp: , Rfl:     raloxifene (EVISTA) 60 MG tablet, Take 1 tablet (60 mg total) by mouth daily., Disp: 30 tablet, Rfl: 3    FH:     Family History   Problem Relation Age of Onset    Myocardial Infarction Mother     Diverticulitis Sister     Diabetes Maternal Grandmother     Brain cancer Maternal Grandfather     Breast cancer Neg Hx        SH:     Social History     Socioeconomic History    Marital status: Single   Tobacco Use    Smoking status: Every Day     Packs/day: 0.50     Years: 30.00     Total pack years: 15.00     Types: Cigarettes    Smokeless tobacco: Never   Vaping Use    Vaping Use: Never used   Substance and Sexual Activity    Alcohol use: Yes     Comment: social  wine    Drug use: Not Currently     Frequency: 2.0 times per week     Types: Marijuana     Comment: uses fro pain rellief  will hold use 02/28/15       ROS:   All other systems reviewed and negative except as described above.        PHYSICAL EXAM:   There were no vitals filed for this visit.    General appearance - alert, well appearing, and in no distress  Mental status - alert, oriented to person, place, and time  Eyes - extraocular eye movements intact  Back exam - + tenderness of para L-spine  Neurological - alert, oriented, normal speech, no focal findings or movement disorder noted  Extremities -  peripheral pulses normal, no pedal edema, no clubbing or cyanosis  Skin - normal coloration and turgor, no rashes, no suspicious skin lesions noted  Musculoskeletal - No tenderness of MCPs and PIPs. Good range of motion of shoulders, elbows, wrists and small joints of hands. Good range of motion of hips, knees and ankles.  Knees crepitus with range of motion of knees without effusions. Ankles without effusions.  No tenderness of MTPs or effusions.   left leg muscle atrophy  due to Polio.             Labs:     Component      Latest Ref Rng & Units 03/18/2018   WBC      3.10 - 9.50 x10 3/uL 9.24   Hemoglobin      11.4 - 14.8 g/dL 09.8   Hematocrit      34.7 - 43.7 % 36.1   Platelet Count      142 - 346 x10 3/uL 206   RBC      3.90 - 5.10 x10 6/uL 4.09   MCV      78.0 - 96.0 fL 88.3   MCH      25.1 - 33.5 pg 29.8   MCHC      31.5 - 35.8 g/dL 11.9   RDW      11 - 15 % 19 (H)   MPV      8.9 - 12.5 fL 10.2   Neutrophils      None % 45.9   Lymphocytes Automated      None % 41.5   Monocytes      None % 9.6   Eosinophils Automated      None % 2.1   Basophils Automated      None % 0.6   Immature Granulocytes      None % 0.3   Nucleated RBC      0.0 - 0.0 /100 WBC 0.0   Neutrophils Absolute      1.10 - 6.33 x10 3/uL 4.24   Lymphocytes Absolute Automated      0.42 - 3.22 x10 3/uL 3.83 (H)   Monocytes Absolute Automated      0.21 - 0.85 x10 3/uL 0.89 (H)   Eosinophils Absolute Automated      0.00 - 0.44 x10 3/uL 0.19   Basophils Absolute Automated      0.00 - 0.08 x10 3/uL 0.06   Immature Granulocytes Absolute      0.00 - 0.07 x10 3/uL 0.03   Nucleated RBC Absolute      0.00 - 0.00 x10 3/uL 0.00   Glucose      70 - 100 mg/dL 93   BUN      7.0 - 14.7 mg/dL 82.9   Creatinine      0.6 - 1.0 mg/dL 0.8   Calcium      8.5 - 10.5 mg/dL 8.9   Sodium      562 - 145 mEq/L 141   Potassium      3.5 - 5.1 mEq/L 3.7   Chloride      100 - 111 mEq/L 106   CO2      22 - 29 mEq/L 22       Radiology:     DXA bone density axial skeleton [IMG572] (Order [id number])  Status: Final result     Study Result    Narrative & Impression   INDICATION: Osteoporosis screening.  Menopause     INTERPRETATION:  DEXA methodology was utilized to evaluate the bone  mineral density.  The measurements are as follows:     PRIOR: 04/25/2011     Left hip- total:  BMD:  0.604 g/cm2.  T-score:  -2.8  Z- score: -1.4  7.3% increase from baseline.     Left femoral neck:  BMD: 0.610 g/cm/2  T-score: -2.2  Z- score: -0.5     Spine (L1-L4):  BMD:   1.032 g/cm2.  T-score:  -0.1  Z- score: 1.7  24% increase from baseline. Please note that lumbar spine BMD values may  be falsely elevated due to sclerosis.        IMPRESSION:    Osteoporosis of the left hip. Osteopenia of the left femoral  neck.       ASSESSMENT:   Ann Patterson is a 71 y.o. female with Osteoporosis ( started Prolia in 2020) chronic smoker, DJD of C-spine and L-spine, OA of Rt hip, Polio with left leg muscle atrophy.    She has Osteoporosis and had Prolia in 12/21 and in 03/2021.       PLAN:           Osteoporosis:     1. Order labs   2. Recommend Prolia 60 mg SQ every 6 months in 09/2021, then in 12/23  3. Recommend total calcium (diet and supplement) 1200 mg daily  4. Recommend Vit D 1000 IU twice per day   5. Exercise      Recommend  as tolerated assuming activities can be performed            safely with low risk of fall and/or injury.  6. Fall Risk Reduction      Recommend self-guided home assessment for fall risk (elimination of clutter, cords, rugs, etc)      Recommend participation in piliates and/or t'ai chi as able  7. Stop smoking       Osteoarthritis/Joint pain     Recommend Tylenol as needed     Follow 6 months           Prolia injection    Consent: Written consent not obtained.  Risks and benefits: risks, benefits and alternatives were discussed  Consent given by: patient  Patient understanding: patient states understanding of the procedure being performed  Patient identity confirmed: verbally with patient  Sterile: ChloraPrep  Body area: left arm   Local anesthesia used: yes  Patient sedated: no  Preparation: patient was prepped and draped in the usual sterile fasion  Meds administered: prolia   Patient tolerance: patient tolerated the procedure well with no immediate complications.  Comments: no

## 2021-10-18 NOTE — Patient Instructions (Addendum)
Osteoporosis:     1. Order labs   2. Recommend Prolia 60 mg SQ every 6 months in 09/2021, then in 12/23  3. Recommend total calcium (diet and supplement) 1200 mg daily  4. Recommend Vit D 1000 IU twice per day   5. Exercise      Recommend  as tolerated assuming activities can be performed            safely with low risk of fall and/or injury.  6. Fall Risk Reduction      Recommend self-guided home assessment for fall risk (elimination of clutter, cords, rugs, etc)      Recommend participation in piliates and/or t'ai chi as able  7. Stop smoking       Osteoarthritis/Joint pain     Recommend Tylenol as needed     Follow 6 months       PATIENT INSTRUCTIONS:  Patient Instructions   Patient Information on Lifestyle Interventions for Osteoporosis     Lifestyle changes may be the best way of preventing osteoporosis or helping with osteoporosis after it has been diagnosed.     Make sure you get enough calcium in your diet or through supplements:  Women age 69 and younger: 1000 mg/day  Women age 22 and older: 1200 mg/day  Men age 4 and younger: 1000 mg/day  Men age 57 and older: 1200 mg/day     Get enough vitamin D:  Women and men under age 69: 400-800 IU per day  Women and men age 69 and older: [phone] IU per day  Some people need more vitamin D. According to the Institute of Medicine (IOM), the safe upper limit of vitamin D is 4,000 IU per day for most adults.     Stop smoking.     Avoid excess alcohol intake: no more than two or three drinks a day.     Be physically active and do weight-bearing exercises, like walking, most days each week.  Aim for at least 2 hours a week (30 minutes a day five times a week or 50 minutes a day three times a week), or as much as you can. Exercises that can improve balance, such as Tai Chi or yoga, may help prevent falls.     Implement strategies to help decrease your risk of falling:  Use a walking aid. If you are unsteady, use a cane or walker.     Remove hazards in the home. Remove  throw rugs. Also, remove or secure loose wires or cables that may make you trip.      Add nightlights in the hallways leading to the bathroom. Consider installing grab bars in the bathroom and nonskid mats near sinks and the tub.     Get help carrying or lifting heavy items. If you are not careful, you could fall, or even suffer a spine fracture without falling.     Wear sturdy shoes with soles that grip. This is above all true in winter or when it rains.     For more information, visit the [employer] website:  http://ewing.com/ or      The [employer] of Rheumatology:  https://www.rheumatology.org/I-Am-A/Patient-Caregiver/Diseases-Conditions/Osteoporosis  -----------------------------  HOW MUCH CALCIUM IS IN THE FOOD YOU EAT?     8 ounces of milk = 300 mg   8 ounces Calcium fortified soy milk = 250 mg   6 ounces Yogurt = 250 mg   1 ounce of most cheeses = 200mg    2 tbsp Parmesan cheese =  195 mg    cup ricotta cheese = 260 mg    cup cottage cheese = 70 mg   1 slice cheese pizza = [id number] mg    cup frozen yogurt = 250 mg    cup vanilla ice cream = 85 mg    cup sherbet = 51 mg     Non-dairy foods     1 cup calcium fortified Orange juice = 300 mg   4 ounces of Salmon = 294 mg   3 ounces sardines with bones = 372 mg   1 cup shrimp = 147 mg   4 ounces Tofu = 145 mg    cup broccoli = 89 mg    cup collard greens, cooked = 152 mg    cup cooked Spinach = 122 mg    cup turnip greens = 184 mg   Cereal bars with calcium = 200 mg   Limit sodas. The phosphorus in them makes it hard for your body to absorb calcium.      -----------------------------------

## 2021-10-22 ENCOUNTER — Ambulatory Visit (INDEPENDENT_AMBULATORY_CARE_PROVIDER_SITE_OTHER): Payer: Medicaid Other | Admitting: Internal Medicine

## 2021-10-27 ENCOUNTER — Ambulatory Visit (INDEPENDENT_AMBULATORY_CARE_PROVIDER_SITE_OTHER): Payer: Medicaid Other | Admitting: Internal Medicine

## 2021-12-20 ENCOUNTER — Other Ambulatory Visit: Payer: Self-pay | Admitting: Obstetrics & Gynecology

## 2021-12-20 ENCOUNTER — Ambulatory Visit
Admission: RE | Admit: 2021-12-20 | Discharge: 2021-12-20 | Disposition: A | Discharge status: Home / Self Care | Payer: 59 | Source: Ambulatory Visit | Attending: Obstetrics & Gynecology | Admitting: Obstetrics & Gynecology

## 2021-12-20 DIAGNOSIS — Z1239 Encounter for other screening for malignant neoplasm of breast: Secondary | ICD-10-CM | POA: Insufficient documentation

## 2021-12-20 DIAGNOSIS — Z1231 Encounter for screening mammogram for malignant neoplasm of breast: Secondary | ICD-10-CM | POA: Insufficient documentation

## 2022-01-04 ENCOUNTER — Encounter: Payer: Self-pay | Admitting: Vascular Neurology

## 2022-01-04 ENCOUNTER — Ambulatory Visit: Payer: 59 | Attending: Vascular Neurology | Admitting: Vascular Neurology

## 2022-01-04 VITALS — BP 99/63 | HR 78 | Resp 14 | Wt 90.0 lb

## 2022-01-04 DIAGNOSIS — M542 Cervicalgia: Secondary | ICD-10-CM | POA: Insufficient documentation

## 2022-01-04 DIAGNOSIS — F17210 Nicotine dependence, cigarettes, uncomplicated: Secondary | ICD-10-CM

## 2022-01-04 DIAGNOSIS — M5412 Radiculopathy, cervical region: Secondary | ICD-10-CM

## 2022-01-04 DIAGNOSIS — F17200 Nicotine dependence, unspecified, uncomplicated: Secondary | ICD-10-CM

## 2022-01-04 DIAGNOSIS — Z79899 Other long term (current) drug therapy: Secondary | ICD-10-CM | POA: Insufficient documentation

## 2022-01-04 MED ORDER — PREGABALIN 200 MG PO CAPS
200.0000 mg | ORAL_CAPSULE | Freq: Three times a day (TID) | ORAL | 5 refills | Status: DC
Start: 2022-01-04 — End: 2022-03-15

## 2022-01-04 NOTE — Progress Notes (Signed)
[facility]    [] Multidisplinary [facility]  [x]  Neurology Clinic   [] Telemedicine     Subjective:      Patient ID: Ann Patterson    CC:  [] Sleep apnea evaluation [] Snoring [] Insomnia [] Excessive daytime sleepiness  [] Restless legs syndrome   [] Fatigue/tiredness [] CPAP follow up [x] Follow up    [] Other:        HPI  The patient continues on Lyrica 200 mg 3 times a day and it is generally working well for her.  If she misses a dose then she may get some tingling or soreness, otherwise is working well.  Her range of motion in her neck is good.    She also has been working to cut down on tobacco, and is using a filter that she showed me during today's visit.    Objective:     General: WDWN, NAD. Vital signs reviewed.  HEENT: No nasal discharges, no obvious oral masses. No icterus or conjunctival hemorrhage noted.  Neck: Full ROM. No neck masses noted.   Psychiatric: Cooperative with exam, normal affect. Thought process and speech pattern normal.    NEUROLOGICAL EXAM    Mental Status: Awake, alert, oriented. Speech fluent without dysarthria. Follows commands well. Attention and concentration appear intact. Fund of knowledge appears appropriate for education.     Cranial Nerves: [x] PERRL   [x] Extraocular movements are full. No nystagmus seen. Face appears symmetric. Hearing is intact to conversational speech. [x] Cranial nerves II-XII otherwise appears intact.     Motor: [x] Moves extremities well, without UE drift. [x] No obvious focal weakness noted. [x] Bulk appears normal. [x] No tremor noted.    Coordination: [x] No obvious dysmetria.  [] Deferred    Gait: [x]  Appears normal, steady [] Deferred    Assessment:       1. Cervicalgia    2. Neck pain    3. Cervical radiculopathy    4. Medication management    5. Current smoker - cutting down on own        Plan:      Continue Lyrica 200 mg TID.  Continue to work on smoking cessation. Not interested in the smoking cessation program at this point, as  is making progress on her own. 3 minutes spent counseling.  Follow up in 6 months.      The patient will return for follow-up as outlined above. If there are any problems with medications (if prescribed) or questions about any available test results, or if there are any new symptoms or changes in current symptoms, the patient is instructed to call the office.         Zephaniah Lubrano B. Stacy Gardner, MD  Director, [facility] Sleep Disorders Program  Assistant Professor, [employer]. Of [employer] of [employer], Counsellor, Animal nutritionist, Sleep Medicine  Board Certified, Programmer, multimedia, Vascular Neurology        *This note was generated by the [employer] speech recognition and may contain inherent errors or omissions not intended by the user. Grammatical errors, random word insertions, deletions, pronoun errors and incomplete sentences are occasional consequences of this technology due to software limitations. Not all errors are caught or corrected. If there are questions or concerns about the content of this note or information contained within the body of this dictation they should be addressed directly with the author for clarification.*

## 2022-03-15 ENCOUNTER — Other Ambulatory Visit (INDEPENDENT_AMBULATORY_CARE_PROVIDER_SITE_OTHER): Payer: Self-pay | Admitting: Vascular Neurology

## 2022-03-15 DIAGNOSIS — M5412 Radiculopathy, cervical region: Secondary | ICD-10-CM

## 2022-03-15 DIAGNOSIS — M542 Cervicalgia: Secondary | ICD-10-CM

## 2022-03-21 ENCOUNTER — Other Ambulatory Visit (INDEPENDENT_AMBULATORY_CARE_PROVIDER_SITE_OTHER): Payer: Self-pay

## 2022-03-21 MED ORDER — DENOSUMAB 60 MG/ML SC SOSY
60.0000 mg | PREFILLED_SYRINGE | Freq: Once | SUBCUTANEOUS | 0 refills | Status: AC
Start: 2022-03-21 — End: 2022-03-21

## 2022-03-21 NOTE — Telephone Encounter (Signed)
Specialty Pharmacy Prolia:     LOV: 10/18/2021  Last Prolia: 10/18/2021  Due: 03/2022    Prolia Rx pended for MD review. Please send to [facility].

## 2022-03-29 ENCOUNTER — Telehealth (INDEPENDENT_AMBULATORY_CARE_PROVIDER_SITE_OTHER): Payer: Self-pay

## 2022-03-29 NOTE — Telephone Encounter (Deleted)
Medication Refill - [facility]      Hello Ann Patterson,      My name is Jerene Canny, Pharmacy refill technician 2 supporting [employer].      Our engagement team has been trying to reach you to schedule your next refill delivery for Prolia. Please give [place] a call back at your earliest convenience at [phone] We can be reached  8:30 AM - 17:00 pm  We will not be able to refill your medication until we hear from you.     Thank you,     Clearnce H[facility]    [phone]     Previous call attempts: [2022-03-22 2:53 pm Call Attempt 1], (2022-03-25 9:44 am Call Attempt 2], [ 2022-03-29 9:26 am Attempt 3]

## 2022-03-30 NOTE — Telephone Encounter (Signed)
Left message for patient to call office back.

## 2022-03-31 ENCOUNTER — Telehealth: Payer: Self-pay

## 2022-03-31 NOTE — Telephone Encounter (Signed)
Hello,     I do apologize the medication is Prolia. We have tried mulitple times to reach out to the patient for her refill to schedule the delivery. W e also have left voicemails. Please assist.      Thank you,  OY'DXAJO J

## 2022-03-31 NOTE — Telephone Encounter (Signed)
Hello,    We have tried mulitple times to reach out to the patient for her Humira refill to schedule the delivery. W e also have left voicemails. Please assist.     Thank you,  ZO'XWRUE J

## 2022-03-31 NOTE — Telephone Encounter (Signed)
Patient is not currently on Humira. Please verify information.

## 2022-04-06 NOTE — Telephone Encounter (Signed)
Left message on [phone] for patient to call office back.  Also, attempted to contact patient on [phone] but phone continues to ring.

## 2022-04-08 ENCOUNTER — Encounter (INDEPENDENT_AMBULATORY_CARE_PROVIDER_SITE_OTHER): Payer: Self-pay | Admitting: Internal Medicine

## 2022-04-08 ENCOUNTER — Ambulatory Visit (INDEPENDENT_AMBULATORY_CARE_PROVIDER_SITE_OTHER): Payer: 59 | Admitting: Internal Medicine

## 2022-04-08 ENCOUNTER — Other Ambulatory Visit (INDEPENDENT_AMBULATORY_CARE_PROVIDER_SITE_OTHER): Payer: Self-pay | Admitting: Internal Medicine

## 2022-04-08 VITALS — BP 98/63 | HR 67 | Wt 93.6 lb

## 2022-04-08 DIAGNOSIS — Z79899 Other long term (current) drug therapy: Secondary | ICD-10-CM

## 2022-04-08 DIAGNOSIS — M5412 Radiculopathy, cervical region: Secondary | ICD-10-CM

## 2022-04-08 DIAGNOSIS — M549 Dorsalgia, unspecified: Secondary | ICD-10-CM

## 2022-04-08 DIAGNOSIS — M810 Age-related osteoporosis without current pathological fracture: Secondary | ICD-10-CM

## 2022-04-08 MED ORDER — VITAMIN D (ERGOCALCIFEROL) 1.25 MG (50000 UT) PO CAPS
50000.0000 [IU] | ORAL_CAPSULE | ORAL | 1 refills | Status: DC
Start: 2022-04-08 — End: 2022-05-11

## 2022-04-08 NOTE — Patient Instructions (Signed)
Osteoporosis:     1. Order labs   2. Recommend Prolia 60 mg SQ every 6 months in 03/2022, then in 6/24  3. Recommend total calcium (diet and supplement) 1200 mg daily  4. Recommend Vit D 1000 IU twice per day   5. Exercise      Recommend  as tolerated assuming activities can be performed            safely with low risk of fall and/or injury.  6. Fall Risk Reduction      Recommend self-guided home assessment for fall risk (elimination of clutter, cords, rugs, etc)      Recommend participation in piliates and/or t'ai chi as able  7. Stop smoking       Osteoarthritis/Joint pain     Recommend Tylenol as needed     Follow 6 months

## 2022-04-08 NOTE — Progress Notes (Signed)
Initial Rheumatology Consultation    Chief Complaint:     Osteoporosis       HPI:   This patient is a 71 y.o. year old female with Osteoporosis ( started Prolia in 2020) chronic smoker, DJD of C-spine and L-spine, OA of Rt hip, Polio with left leg muscle atrophy referred by Shon Batonin, Anwar U, MD to evaluate Osteoporosis.     She has Osteoporosis.  She started Prolia in 2020 with her GYN [facility]. She had Prolia in 03/2020. She did not followed with [facility].     She had Prolia 60mg  injection on 04/13/2021 and 6/23 at [facility]. No side effect.     She started taking calcium 600 mg BID and Vit D. She does walking exercise 30 minutes and does yard work. She had no history of cancer, chemotherapy or radiation therapy.    The patient's risk factors for osteoporosis include:  Had hysterectomy without hormone replacement therapy  History of fragility fracture of left wrist   No Family history of osteoporosis and hip fracture  Current smoker: half pack day, smoke for  50 years  No Alcohol usages drinks per day  She has weight loss 20 lbs in the past over 10 yrs. Her weight is about 90 lbs.         DXA bone Density study on 12/18/2020:   The AP Spine BMD=  g/cm2, T-Score= -0.0  The left femoral neck BMD=  g/cm2, T-Score= -1.9  The left hip BMD= g/cm2, T-Score=-2.6     Compared to study done 2021  there is no significant changes at the spine and a significant increase at the hip of 4.1%.    DXA bone Density study on 07/19/19:   The AP Spine BMD=  g/cm2, T-Score= -0.1  The left femoral neck BMD=  g/cm2, T-Score= -2.2  The left hip BMD= g/cm2, T-Score=-2.8     24% increase from baseline in 2012. Please note that lumbar spine BMD values may  be falsely elevated due to sclerosis.    She has back pain.         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    Vertigo        Past Surgical History:   Procedure Laterality Date    APPENDECTOMY (OPEN)      71 years of age    BACK SURGERY  2005    plate in her neck-    CARPAL TUNNEL RELEASE      bilateral hands 1994 and 1996    EXTRACTION, CATARACT, PHACO, IOL Left 02/28/2019    Procedure: EXTRACTION, CATARACT, PHACO, IOL;  Surgeon: Belinda BlockSilk, Muhammad W, MD;  [facility];  Service: Ophthalmology;  Laterality: Left;  LEFT  EYE PHACO WITH IOL    EXTRACTION, CATARACT, PHACO, IOL Right 03/14/2019    Procedure: EXTRACTION, CATARACT, PHACO, IOL;  Surgeon: Belinda BlockSilk, Muhammad W, MD;  [facility];  Service: Ophthalmology;  Laterality: Right;  RIGHT  EYE PHACO WITH IOL    FRACTURE SURGERY Left  at 71 years of age    HYSTERECTOMY      1995    ORIF, FINGER Left 03/02/2015    Procedure: ORIF, FINGER;  Surgeon: Kellie Simmering, MD;  Location: [facility] OR;  Service: Plastics;  Laterality: Left;  LEFT SMALL FINGER METACARPAL ORIF    REPAIR, HAND, NERVE  12/10/2012    Procedure: REPAIR, HAND, NERVE;  Surgeon: Kellie Simmering, MD;  Location: [facility];  Service: Plastics;  Laterality: Left;  LEFT SMALL FINGER EXPLORATION; REPAIR DIGITAL NERVE    REPAIR, LIGAMENT Left 03/02/2015    Procedure: REPAIR, LIGAMENT;  Surgeon: Kellie Simmering, MD;  Location: [facility];  Service: Plastics;  Laterality: Left;  LEFT MIDDLE FINGER RADIAL COLLATERAL LIGAMENT REPAIR       Allergies:     Allergies   Allergen Reactions    Gadolinium      Heart fluttering    Iodine      Heart fluttering    Penicillins Hives       Meds:     Current Outpatient Medications:     betamethasone dipropionate (DEL-BETA) 0.05 % cream, Apply topically 2 (two) times daily, Disp: , Rfl:     Cholecalciferol (Vitamin D3) 50 MCG (2000 UT) Tab, Take by mouth, Disp: , Rfl:     ciclopirox (PENLAC) 8 % solution, APPLY TO AFFECTED NAILS NIGHTLY UNTIL CONDITION IMPROVES AS DIRECTED,  Disp: , Rfl:     clindamycin (CLEOCIN) 300 MG capsule, Take 1 capsule (300 mg) by mouth 3 (three) times daily, Disp: , Rfl:     fluticasone (FLONASE) 50 MCG/ACT nasal spray, APPLY ONE SPRAY IN EACH NOSTRIL EVERY DAY, Disp: , Rfl:     loratadine (Claritin) 10 MG tablet, Take 1 tablet (10 mg total) by mouth daily, Disp: 30 tablet, Rfl: 0    Lyrica 200 MG capsule, TAKE ONE CAPSULE BY MOUTH THREE TIMES A DAY, Disp: 90 capsule, Rfl: 5    mupirocin (BACTROBAN) 2 % ointment, Apply topically every 8 (eight) hours, Disp: , Rfl:     oxybutynin XL (DITROPAN-XL) 5 MG 24 hr tablet, Take 1 tablet (5 mg) by mouth daily, Disp: , Rfl:     pravastatin (PRAVACHOL) 20 MG tablet, Take 1 tablet (20 mg) by mouth nightly, Disp: , Rfl:     raloxifene (EVISTA) 60 MG tablet, Take 1 tablet (60 mg total) by mouth daily., Disp: 30 tablet, Rfl: 3    sodium chloride (MURO 128) 5 % ophthalmic solution, 1 drop as needed, Disp: , Rfl:     FH:     Family History   Problem Relation Age of Onset    Myocardial Infarction Mother     Diverticulitis Sister     Diabetes Maternal Grandmother     Brain cancer Maternal Grandfather     Breast cancer Neg Hx        SH:     Social History     Socioeconomic History    Marital status: Single   Tobacco Use    Smoking status: Every Day     Packs/day: 0.50     Years: 30.00     Additional pack years: 0.00     Total pack years: 15.00     Types: Cigarettes    Smokeless tobacco: Never   Vaping Use    Vaping Use: Never used   Substance and Sexual Activity    Alcohol use: Yes     Comment: social  wine    Drug use: Not Currently  Frequency: 2.0 times per week     Types: Marijuana     Comment: uses fro pain rellief  will hold use 02/28/15       ROS:   All other systems reviewed and negative except as described above.        PHYSICAL EXAM:   There were no vitals filed for this visit.    General appearance - alert, well appearing, and in no distress  Mental status - alert, oriented to person, place, and time  Eyes - extraocular  eye movements intact  Back exam - + tenderness of para L-spine  Neurological - alert, oriented, normal speech, no focal findings or movement disorder noted  Extremities - peripheral pulses normal, no pedal edema, no clubbing or cyanosis  Skin - normal coloration and turgor, no rashes, no suspicious skin lesions noted  Musculoskeletal - No tenderness of MCPs and PIPs. Good range of motion of shoulders, elbows, wrists and small joints of hands. Good range of motion of hips, knees and ankles.  Knees crepitus with range of motion of knees without effusions. Ankles without effusions.  No tenderness of MTPs or effusions.   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             Labs:     Component      Latest Ref Rng & Units 03/18/2018   WBC      3.10 - 9.50 x10 3/uL 9.24   Hemoglobin      11.4 - 14.8 g/dL 16.1   Hematocrit      34.7 - 43.7 % 36.1   Platelet Count      142 - 346 x10 3/uL 206   RBC      3.90 - 5.10 x10 6/uL 4.09   MCV      78.0 - 96.0 fL 88.3   MCH      25.1 - 33.5 pg 29.8   MCHC      31.5 - 35.8 g/dL 09.6   RDW      11 - 15 % 19 (H)   MPV      8.9 - 12.5 fL 10.2   Neutrophils      None % 45.9   Lymphocytes Automated      None % 41.5   Monocytes      None % 9.6   Eosinophils Automated      None % 2.1   Basophils Automated      None % 0.6   Immature Granulocytes      None % 0.3   Nucleated RBC      0.0 - 0.0 /100 WBC 0.0   Neutrophils Absolute      1.10 - 6.33 x10 3/uL 4.24   Lymphocytes Absolute Automated      0.42 - 3.22 x10 3/uL 3.83 (H)   Monocytes Absolute Automated      0.21 - 0.85 x10 3/uL 0.89 (H)   Eosinophils Absolute Automated      0.00 - 0.44 x10 3/uL 0.19   Basophils Absolute Automated      0.00 - 0.08 x10 3/uL 0.06   Immature Granulocytes Absolute      0.00 - 0.07 x10 3/uL 0.03   Nucleated RBC Absolute      0.00 - 0.00 x10 3/uL 0.00   Glucose      70 - 100 mg/dL 93   BUN      7.0 - 04.5 mg/dL 40.9   Creatinine  0.6 - 1.0 mg/dL 0.8   Calcium      8.5 - 10.5 mg/dL 8.9   Sodium      892 - 145 mEq/L  141   Potassium      3.5 - 5.1 mEq/L 3.7   Chloride      100 - 111 mEq/L 106   CO2      22 - 29 mEq/L 22       Radiology:     DXA bone density axial skeleton [IMG572] (Order [id number])  Status: Final result     Study Result    Narrative & Impression   INDICATION: Osteoporosis screening.  Menopause     INTERPRETATION:  DEXA methodology was utilized to evaluate the bone  mineral density.  The measurements are as follows:     PRIOR: 04/25/2011     Left hip- total:  BMD:  0.604 g/cm2.  T-score:  -2.8  Z- score: -1.4  7.3% increase from baseline.     Left femoral neck:  BMD: 0.610 g/cm/2  T-score: -2.2  Z- score: -0.5     Spine (L1-L4):  BMD:  1.032 g/cm2.  T-score:  -0.1  Z- score: 1.7  24% increase from baseline. Please note that lumbar spine BMD values may  be falsely elevated due to sclerosis.        IMPRESSION:    Osteoporosis of the left hip. Osteopenia of the left femoral  neck.       ASSESSMENT:   Ann Patterson is a 71 y.o. female with Osteoporosis ( started Prolia in 2020) chronic smoker, DJD of C-spine and L-spine, OA of Rt hip, Polio with left leg muscle atrophy.    She has Osteoporosis and had Prolia ( in 03/2020, 03/2021, 09/2021)    PLAN:           Osteoporosis:     1. Order labs   2. Recommend Prolia 60 mg SQ every 6 months in 03/2022, then in 6/24  3. Recommend total calcium (diet and supplement) 1200 mg daily  4. Recommend Vit D 1000 IU twice per day   5. Exercise      Recommend  as tolerated assuming activities can be performed            safely with low risk of fall and/or injury.  6. Fall Risk Reduction      Recommend self-guided home assessment for fall risk (elimination of clutter, cords, rugs, etc)      Recommend participation in piliates and/or t'ai chi as able  7. Stop smoking       Osteoarthritis/Joint pain     Recommend Tylenol as needed     Follow 6 months

## 2022-04-09 LAB — COMPREHENSIVE METABOLIC PANEL
ALT: 13 IU/L (ref 0–32)
AST (SGOT): 28 IU/L (ref 0–40)
Albumin/Globulin Ratio: 1.3 (ref 1.2–2.2)
Albumin: 4 g/dL (ref 3.9–4.9)
Alkaline Phosphatase: 47 IU/L (ref 44–121)
BUN / Creatinine Ratio: 11 — ABNORMAL LOW (ref 12–28)
BUN: 9 mg/dL (ref 8–27)
Bilirubin, Total: 0.3 mg/dL (ref 0.0–1.2)
CO2: 27 mmol/L (ref 20–29)
Calcium: 9 mg/dL (ref 8.7–10.3)
Chloride: 101 mmol/L (ref 96–106)
Creatinine: 0.81 mg/dL (ref 0.57–1.00)
Globulin, Total: 3.2 g/dL (ref 1.5–4.5)
Glucose: 106 mg/dL — ABNORMAL HIGH (ref 70–99)
Potassium: 4.7 mmol/L (ref 3.5–5.2)
Protein, Total: 7.2 g/dL (ref 6.0–8.5)
Sodium: 139 mmol/L (ref 134–144)
eGFR: 79 mL/min/{1.73_m2} (ref >59)

## 2022-04-11 ENCOUNTER — Telehealth: Payer: Self-pay

## 2022-04-11 NOTE — Telephone Encounter (Signed)
Left message for patient on [phone] (mobile) and [phone] (home) for patient to call office back to schedule nurse visit for Prolia administration.

## 2022-04-11 NOTE — Telephone Encounter (Signed)
Note to scheduling agent: This telephone encounter should not be sent as a high/low priority.    Ann Patterson is calling regarding a general question. The patient would like to know: PT request a nurse call she has questions about her Prolia Injection and labs.  Patient can be reached at: [phone] (mobile) or [phone] (home)    The patient has been made aware that they should allow 48-72 hours for a response.

## 2022-04-12 NOTE — Telephone Encounter (Signed)
Pt called back to schedule prolia injection. Appt was given.

## 2022-04-13 ENCOUNTER — Ambulatory Visit (INDEPENDENT_AMBULATORY_CARE_PROVIDER_SITE_OTHER): Payer: 59

## 2022-04-13 ENCOUNTER — Telehealth (INDEPENDENT_AMBULATORY_CARE_PROVIDER_SITE_OTHER): Payer: Self-pay | Admitting: Internal Medicine

## 2022-04-13 DIAGNOSIS — M810 Age-related osteoporosis without current pathological fracture: Secondary | ICD-10-CM

## 2022-04-13 MED ORDER — DENOSUMAB 60 MG/ML SC SOSY
60.0000 mg | PREFILLED_SYRINGE | Freq: Once | SUBCUTANEOUS | Status: AC
Start: 2022-04-13 — End: 2022-04-13
  Administered 2022-04-13: 60 mg via SUBCUTANEOUS

## 2022-04-13 NOTE — Progress Notes (Signed)
Patient arrived to clinic for scheduled Prolia injection.  Physician order in Epic.  Medication was removed from refrigerator 30 minutes prior to injection.  Patient brought to room, name and DOB verified, patient made ware of medication given.  Medication verified with MD.  Injection administered per 6 rights of medication administration.  Patient waited 15 minutes after administration, no adverse reactions noted.    Administered Prolia 60mg/ml subcutaneously to patient's upper left arm.    NDC: 55513-710-01  LOT: [id number]  EXP: 07/24/2022

## 2022-04-25 ENCOUNTER — Encounter (INDEPENDENT_AMBULATORY_CARE_PROVIDER_SITE_OTHER): Payer: Self-pay | Admitting: Internal Medicine

## 2022-04-25 DIAGNOSIS — M549 Dorsalgia, unspecified: Secondary | ICD-10-CM

## 2022-04-25 DIAGNOSIS — Z79899 Other long term (current) drug therapy: Secondary | ICD-10-CM

## 2022-04-25 DIAGNOSIS — M810 Age-related osteoporosis without current pathological fracture: Secondary | ICD-10-CM

## 2022-04-25 DIAGNOSIS — M5412 Radiculopathy, cervical region: Secondary | ICD-10-CM

## 2022-05-10 ENCOUNTER — Telehealth: Payer: Self-pay

## 2022-05-10 NOTE — Telephone Encounter (Signed)
Note to scheduling agent: This telephone encounter should not be sent as a high/low priority.    Ann Patterson is calling regarding a general question. The patient would like to know: Pt is requesting a prescription for Vitamin D due to unable to afford otc prescription. Please Send to [place], [place]     Patient can be reached at: [phone] (mobile) or [phone] (home)    The patient has been made aware that they should allow 48-72 hours for a response.

## 2022-05-11 ENCOUNTER — Telehealth (INDEPENDENT_AMBULATORY_CARE_PROVIDER_SITE_OTHER): Payer: Self-pay | Admitting: Internal Medicine

## 2022-05-11 MED ORDER — VITAMIN D (ERGOCALCIFEROL) 1.25 MG (50000 UT) PO CAPS
50000.0000 [IU] | ORAL_CAPSULE | ORAL | 3 refills | Status: DC
Start: 2022-05-11 — End: 2023-06-27

## 2022-05-11 NOTE — Telephone Encounter (Signed)
Refill Vit D

## 2022-05-25 ENCOUNTER — Telehealth: Payer: Self-pay | Admitting: Internal Medicine

## 2022-05-25 NOTE — Telephone Encounter (Signed)
Attempted to call pt but no answer. Leave detail message on voicemail box " Dr.Zhang has sent Vit D to pharmacy on 05/11/2022. Please give pharmacy a call to process a refill, if pt has any questions please give [place] a call back."

## 2022-05-25 NOTE — Telephone Encounter (Signed)
Name, strength, directions of requested refill(s):    vitamin D, ergocalciferol, (DRISDOL) 50000 UNIT Cap   How much medication is remaining:     PATIENT IS OUT OF [place] to send refill to or patient to pick up rx from office (mark requested pharmacy in BOLD):      [id number] - [place], [place]  [place] [address]  Phone: [phone] Fax: [phone]        Please mark "X" next to the preferred call back number:    Mobile: [phone] (mobile)    Home: [phone] (home)    Work: @WORKPHONE @        Medication refill request, see above. Thank you   Patient has been informed that medication refill requests should be called in up to one week prior to running out of medication.    Additional Notes:  Patient called in for a prescription to be placed for Vitamin D. Patient states medication is to costly for over the counter and she can not afford it.     Next Visit: MM/DD/YY

## 2022-07-19 ENCOUNTER — Telehealth: Payer: Self-pay

## 2022-07-19 NOTE — Telephone Encounter (Signed)
SENT LYRICA PA TO insurance. Message from [facility]:     Available without authorization. The member is able to fill the requested drug at the pharmacy. If coverage is still needed or requesting prior to the expiration of a current authorization, a request can be made by sending a fax or calling the number on the back of the member's ID card.

## 2022-08-15 ENCOUNTER — Telehealth: Payer: Self-pay | Admitting: Vascular Neurology

## 2022-08-15 ENCOUNTER — Other Ambulatory Visit: Payer: Self-pay

## 2022-08-15 DIAGNOSIS — M542 Cervicalgia: Secondary | ICD-10-CM

## 2022-08-15 DIAGNOSIS — M5412 Radiculopathy, cervical region: Secondary | ICD-10-CM

## 2022-08-15 MED ORDER — PREGABALIN 200 MG PO CAPS
200.0000 mg | ORAL_CAPSULE | Freq: Three times a day (TID) | ORAL | 5 refills | Status: DC
Start: 2022-08-15 — End: 2022-11-16

## 2022-08-15 NOTE — Telephone Encounter (Signed)
Name, strength, directions of requested refill(s):  Lyrica 200 MG capsule     How much medication is remaining: enough for a week    Pharmacy to send refill to or patient to pick up rx from office (mark requested pharmacy in BOLD):      [id number] - [place], [place] - [address]  [address]  [place] [address]  Phone: [phone] Fax: [phone]        Please mark "X" next to the preferred call back number:    Mobile: [phone] (mobile) x   Home: [phone] (home)    Work: @WORKPHONE @        Medication refill request, see above. Thank you   Patient has been informed that medication refill requests should be called in up to one week prior to running out of medication.    Additional Notes:  Last Visit: 08/24/2022 w/ Sam  Next Visit: 01/17/2023 w/ Dr. Stacy Gardner

## 2022-08-24 ENCOUNTER — Ambulatory Visit: Payer: Medicare Other | Admitting: Family Nurse Practitioner

## 2022-09-14 ENCOUNTER — Other Ambulatory Visit: Payer: Self-pay

## 2022-09-14 MED ORDER — PROLIA 60 MG/ML SC SOSY
60.0000 mg | PREFILLED_SYRINGE | Freq: Once | SUBCUTANEOUS | 0 refills | Status: AC
Start: 2022-09-14 — End: 2022-09-14

## 2022-09-28 NOTE — Telephone Encounter (Signed)
Patient has followup appointment and Prolia injection on 10/17/2022.  Please advise on Prolia delivery date.

## 2022-10-10 ENCOUNTER — Telehealth: Payer: Self-pay | Admitting: Internal Medicine

## 2022-10-10 NOTE — Telephone Encounter (Signed)
This telephone encounter is being sent on behalf of Ann Patterson. The patient is scheduled to have labs drawn tomorrow and needs the lab order placed.      The patient is going to: [facility]     Additional Notes: Patient is requesting that her lab orders be faxed to the location provided below. Patient has seemed to misplace the orders and she does not have MyChart. If someone could please contact the patient once the request has been faxed.    [facility]  [address]  [address] [place], [address]  PHONE: [phone]  FAX: [phone]    Thank you

## 2022-10-10 NOTE — Telephone Encounter (Signed)
Faxed lab order to number provided. Fax confirmation received.

## 2022-10-12 LAB — COMPREHENSIVE METABOLIC PANEL
ALT: 11 IU/L (ref 0–32)
AST (SGOT): 26 IU/L (ref 0–40)
Albumin: 4.1 g/dL (ref 3.9–4.9)
Alkaline Phosphatase: 48 IU/L (ref 44–121)
BUN / Creatinine Ratio: 10 — ABNORMAL LOW (ref 12–28)
BUN: 8 mg/dL (ref 8–27)
Bilirubin, Total: 0.5 mg/dL (ref 0.0–1.2)
CO2: 24 mmol/L (ref 20–29)
Calcium: 9.4 mg/dL (ref 8.7–10.3)
Chloride: 101 mmol/L (ref 96–106)
Creatinine: 0.84 mg/dL (ref 0.57–1.00)
Globulin, Total: 2.9 g/dL (ref 1.5–4.5)
Glucose: 105 mg/dL — ABNORMAL HIGH (ref 70–99)
Potassium: 4.9 mmol/L (ref 3.5–5.2)
Protein, Total: 7 g/dL (ref 6.0–8.5)
Sodium: 140 mmol/L (ref 134–144)
eGFR: 75 mL/min/{1.73_m2} (ref >59)

## 2022-10-12 LAB — VITAMIN D, 25 OH, TOTAL: Vitamin D 25-Hydroxy: 84 ng/mL (ref 30.0–100.0)

## 2022-10-13 ENCOUNTER — Telehealth (INDEPENDENT_AMBULATORY_CARE_PROVIDER_SITE_OTHER): Payer: Self-pay

## 2022-10-13 NOTE — Telephone Encounter (Signed)
Rec'vd prolia in office.    Ordered under (SP).    Patient has in person appointment scheduled on 10/17/2022 and will administer then.

## 2022-10-17 ENCOUNTER — Ambulatory Visit (INDEPENDENT_AMBULATORY_CARE_PROVIDER_SITE_OTHER): Payer: Medicare Other | Admitting: Internal Medicine

## 2022-10-21 ENCOUNTER — Ambulatory Visit (INDEPENDENT_AMBULATORY_CARE_PROVIDER_SITE_OTHER): Payer: Medicare Other | Admitting: Internal Medicine

## 2022-10-26 ENCOUNTER — Ambulatory Visit (INDEPENDENT_AMBULATORY_CARE_PROVIDER_SITE_OTHER): Payer: Medicare Other | Admitting: Internal Medicine

## 2022-10-26 ENCOUNTER — Encounter (INDEPENDENT_AMBULATORY_CARE_PROVIDER_SITE_OTHER): Payer: Self-pay | Admitting: Internal Medicine

## 2022-10-26 VITALS — BP 97/62 | HR 74 | Wt 101.0 lb

## 2022-10-26 DIAGNOSIS — M549 Dorsalgia, unspecified: Secondary | ICD-10-CM

## 2022-10-26 DIAGNOSIS — M542 Cervicalgia: Secondary | ICD-10-CM

## 2022-10-26 DIAGNOSIS — M810 Age-related osteoporosis without current pathological fracture: Secondary | ICD-10-CM

## 2022-10-26 DIAGNOSIS — Z79899 Other long term (current) drug therapy: Secondary | ICD-10-CM

## 2022-10-26 MED ORDER — DENOSUMAB 60 MG/ML SC SOSY
60.0000 mg | PREFILLED_SYRINGE | Freq: Once | SUBCUTANEOUS | Status: AC
Start: 2022-10-26 — End: 2022-10-26
  Administered 2022-10-26: 60 mg via SUBCUTANEOUS

## 2022-10-26 NOTE — Patient Instructions (Signed)
Osteoporosis:     1. Order labs   2. Recommend Prolia 60 mg SQ every 6 months in 10/26/22, then in 12/24  3. Recommend total calcium (diet and supplement) 1200 mg daily  4. Recommend Vit D 1000 IU twice per day   5. Exercise      Recommend  as tolerated assuming activities can be performed            safely with low risk of fall and/or injury.  6. Fall Risk Reduction      Recommend self-guided home assessment for fall risk (elimination of clutter, cords, rugs, etc)      Recommend participation in piliates and/or t'ai chi as able  7. Stop smoking       Osteoarthritis/Joint pain     Recommend Tylenol as needed     Follow 6 months

## 2022-10-26 NOTE — Progress Notes (Signed)
Patient arrived to clinic for scheduled Prolia injection.  Physician order in Epic.  Medication was removed from refrigerator 30 minutes prior to injection.  Patient brought to room, name and DOB verified, patient made ware of medication given.  Medication verified with MD.  Injection administered per 6 rights of medication administration.  Patient waited 15 minutes after administration, no adverse reactions noted.    Administered Prolia 60mg/ml subcutaneously to patient's upper left arm.    NDC: 55513-710-01  LOT: [id number]  EXP: 02/22/2025

## 2022-10-26 NOTE — Progress Notes (Signed)
Initial Rheumatology Consultation    Chief Complaint:     Osteoporosis       HPI:   This patient is a 72 y.o. year old female with Osteoporosis ( started Prolia in 2020) chronic smoker, DJD of C-spine and L-spine, OA of Rt hip, Polio with left leg muscle atrophy referred by Shon Baton, MD to evaluate Osteoporosis.     She has Osteoporosis.  She started Prolia in 2020 with her GYN Dr. Ralene Bathe at [facility]. She had Prolia in 03/2020. She did not followed with Dr. Ralene Bathe at [facility].     She had Prolia 60mg  injection on 04/13/2021, 09/2021, 03/2023 at [facility]. No side effect.     She started taking calcium 600 mg BID and Vit D. She does walking exercise 30 minutes and does yard work. She had no history of cancer, chemotherapy or radiation therapy.    The patient's risk factors for osteoporosis include:  Had hysterectomy without hormone replacement therapy  History of fragility fracture of left wrist   No Family history of osteoporosis and hip fracture  Current smoker: half pack day, smoke for  50 years  No Alcohol usages drinks per day  She has weight loss 20 lbs in the past over 10 yrs. Her weight is about 90 lbs.         DXA bone Density study on 12/18/2020:   The AP Spine BMD=  g/cm2, T-Score= -0.0  The left femoral neck BMD=  g/cm2, T-Score= -1.9  The left hip BMD= g/cm2, T-Score=-2.6     Compared to study done 2021  there is no significant changes at the spine and a significant increase at the hip of 4.1%.    DXA bone Density study on 07/19/19:   The AP Spine BMD=  g/cm2, T-Score= -0.1  The left femoral neck BMD=  g/cm2, T-Score= -2.2  The left hip BMD= g/cm2, T-Score=-2.8     24% increase from baseline in 2012. Please note that lumbar spine BMD values may  be falsely elevated due to sclerosis.    She has back pain.         PMSH:     Past Medical History:   Diagnosis Date    Headache     Hyperlipidemia     Low back pain     Meningitis spinal 1956    spinal and "regular    Neuropathy     upper c spine issues, +  carpal tunnel symptoms bilaterally    Pain     Pneumonia 2015    Polio     Polio 1956    Vertigo        Past Surgical History:   Procedure Laterality Date    APPENDECTOMY (OPEN)      72 years of age    BACK SURGERY  2005    plate in her neck-    CARPAL TUNNEL RELEASE      bilateral hands 1994 and 1996    EXTRACTION, CATARACT, PHACO, IOL Left 02/28/2019    Procedure: EXTRACTION, CATARACT, PHACO, IOL;  Surgeon: Belinda Block, MD;  Location: [facility] OR;  Service: Ophthalmology;  Laterality: Left;  LEFT  EYE PHACO WITH IOL    EXTRACTION, CATARACT, PHACO, IOL Right 03/14/2019    Procedure: EXTRACTION, CATARACT, PHACO, IOL;  Surgeon: Belinda Block, MD;  Location: [facility] OR;  Service: Ophthalmology;  Laterality: Right;  RIGHT  EYE PHACO WITH IOL    FRACTURE SURGERY Left  at 72 years of age    HYSTERECTOMY      1995    ORIF, FINGER Left 03/02/2015    Procedure: ORIF, FINGER;  Surgeon: Kellie Simmering, MD;  Location: [facility] OR;  Service: Plastics;  Laterality: Left;  LEFT SMALL FINGER METACARPAL ORIF    REPAIR, HAND, NERVE  12/10/2012    Procedure: REPAIR, HAND, NERVE;  Surgeon: Kellie Simmering, MD;  Location: [facility];  Service: Plastics;  Laterality: Left;  LEFT SMALL FINGER EXPLORATION; REPAIR DIGITAL NERVE    REPAIR, LIGAMENT Left 03/02/2015    Procedure: REPAIR, LIGAMENT;  Surgeon: Kellie Simmering, MD;  Location: [facility] OR;  Service: Plastics;  Laterality: Left;  LEFT MIDDLE FINGER RADIAL COLLATERAL LIGAMENT REPAIR       Allergies:     Allergies   Allergen Reactions    Gadolinium      Heart fluttering    Iodine      Heart fluttering    Penicillins Hives       Meds:     Current Outpatient Medications:     betamethasone dipropionate (DEL-BETA) 0.05 % cream, Apply topically 2 (two) times daily, Disp: , Rfl:     ciclopirox (PENLAC) 8 % solution, APPLY TO AFFECTED NAILS NIGHTLY UNTIL CONDITION IMPROVES AS DIRECTED, Disp: , Rfl:     fluticasone (FLONASE) 50 MCG/ACT nasal spray, APPLY ONE  SPRAY IN EACH NOSTRIL EVERY DAY, Disp: , Rfl:     loratadine (Claritin) 10 MG tablet, Take 1 tablet (10 mg total) by mouth daily, Disp: 30 tablet, Rfl: 0    oxybutynin XL (DITROPAN-XL) 5 MG 24 hr tablet, Take 1 tablet (5 mg) by mouth daily, Disp: , Rfl:     pravastatin (PRAVACHOL) 20 MG tablet, Take 1 tablet (20 mg) by mouth nightly, Disp: , Rfl:     pregabalin (Lyrica) 200 MG capsule, Take 1 capsule (200 mg) by mouth 3 (three) times daily, Disp: 90 capsule, Rfl: 5    raloxifene (EVISTA) 60 MG tablet, Take 1 tablet (60 mg total) by mouth daily., Disp: 30 tablet, Rfl: 3    sodium chloride (MURO 128) 5 % ophthalmic solution, 1 drop as needed, Disp: , Rfl:     vitamin D, ergocalciferol, (DRISDOL) 50000 UNIT Cap, Take 1 capsule (50,000 Units) by mouth once a week, Disp: 12 capsule, Rfl: 3    clindamycin (CLEOCIN) 300 MG capsule, Take 1 capsule (300 mg) by mouth 3 (three) times daily (Patient not taking: Reported on 04/08/2022), Disp: , Rfl:     mupirocin (BACTROBAN) 2 % ointment, Apply topically every 8 (eight) hours (Patient not taking: Reported on 10/26/2022), Disp: , Rfl:     Current Facility-Administered Medications:     denosumab (PROLIA) subcutaneous injection 60 mg, 60 mg, Subcutaneous, Once, Sharen Hint, MD    FH:     Family History   Problem Relation Age of Onset    Myocardial Infarction Mother     Diverticulitis Sister     Diabetes Maternal Grandmother     Brain cancer Maternal Grandfather     Breast cancer Neg Hx        SH:     Social History     Socioeconomic History    Marital status: Single   Tobacco Use    Smoking status: Every Day     Current packs/day: 0.50     Average packs/day: 0.5 packs/day for 30.0 years (15.0 ttl pk-yrs)     Types: Cigarettes    Smokeless tobacco: Never  Vaping Use    Vaping status: Never Used   Substance and Sexual Activity    Alcohol use: Yes     Comment: social  wine    Drug use: Not Currently     Frequency: 2.0 times per week     Types: Marijuana     Comment: uses fro pain  rellief  will hold use 02/28/15       ROS:   All other systems reviewed and negative except as described above.        PHYSICAL EXAM:     Vitals:    10/26/22 1521   BP: 97/62   Pulse: 74       General appearance - alert, well appearing, and in no distress  Mental status - alert, oriented to person, place, and time  Eyes - extraocular eye movements intact  Back exam - + tenderness of para L-spine  Neurological - alert, oriented, normal speech, no focal findings or movement disorder noted  Extremities - peripheral pulses normal, no pedal edema, no clubbing or cyanosis  Skin - normal coloration and turgor, no rashes, no suspicious skin lesions noted  Musculoskeletal - No tenderness of MCPs and PIPs. Good range of motion of shoulders, elbows, wrists and small joints of hands. Good range of motion of hips, knees and ankles.  Knees crepitus with range of motion of knees without effusions. Ankles without effusions.  No tenderness of MTPs or effusions.   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             Labs:     Component      Latest Ref Rng & Units 03/18/2018   WBC      3.10 - 9.50 x10 3/uL 9.24   Hemoglobin      11.4 - 14.8 g/dL 54.0   Hematocrit      34.7 - 43.7 % 36.1   Platelet Count      142 - 346 x10 3/uL 206   RBC      3.90 - 5.10 x10 6/uL 4.09   MCV      78.0 - 96.0 fL 88.3   MCH      25.1 - 33.5 pg 29.8   MCHC      31.5 - 35.8 g/dL 98.1   RDW      11 - 15 % 19 (H)   MPV      8.9 - 12.5 fL 10.2   Neutrophils      None % 45.9   Lymphocytes Automated      None % 41.5   Monocytes      None % 9.6   Eosinophils Automated      None % 2.1   Basophils Automated      None % 0.6   Immature Granulocytes      None % 0.3   Nucleated RBC      0.0 - 0.0 /100 WBC 0.0   Neutrophils Absolute      1.10 - 6.33 x10 3/uL 4.24   Lymphocytes Absolute Automated      0.42 - 3.22 x10 3/uL 3.83 (H)   Monocytes Absolute Automated      0.21 - 0.85 x10 3/uL 0.89 (H)   Eosinophils Absolute Automated      0.00 - 0.44 x10 3/uL 0.19   Basophils  Absolute Automated      0.00 - 0.08 x10 3/uL 0.06   Immature Granulocytes Absolute      0.00 -  0.07 x10 3/uL 0.03   Nucleated RBC Absolute      0.00 - 0.00 x10 3/uL 0.00   Glucose      70 - 100 mg/dL 93   BUN      7.0 - 54.0 mg/dL 98.1   Creatinine      0.6 - 1.0 mg/dL 0.8   Calcium      8.5 - 10.5 mg/dL 8.9   Sodium      191 - 145 mEq/L 141   Potassium      3.5 - 5.1 mEq/L 3.7   Chloride      100 - 111 mEq/L 106   CO2      22 - 29 mEq/L 22       Radiology:     DXA bone density axial skeleton [IMG572] (Order [id number])  Status: Final result     Study Result    Narrative & Impression   INDICATION: Osteoporosis screening.  Menopause     INTERPRETATION:  DEXA methodology was utilized to evaluate the bone  mineral density.  The measurements are as follows:     PRIOR: 04/25/2011     Left hip- total:  BMD:  0.604 g/cm2.  T-score:  -2.8  Z- score: -1.4  7.3% increase from baseline.     Left femoral neck:  BMD: 0.610 g/cm/2  T-score: -2.2  Z- score: -0.5     Spine (L1-L4):  BMD:  1.032 g/cm2.  T-score:  -0.1  Z- score: 1.7  24% increase from baseline. Please note that lumbar spine BMD values may  be falsely elevated due to sclerosis.        IMPRESSION:    Osteoporosis of the left hip. Osteopenia of the left femoral  neck.       ASSESSMENT:   Ann Patterson is a 72 y.o. female with Osteoporosis ( started Prolia in 2020) chronic smoker, DJD of C-spine and L-spine, OA of Rt hip, Polio with left leg muscle atrophy.    She has Osteoporosis and had Prolia ( in 03/2020, 03/2021, 09/2021, 03/2022)    PLAN:           Osteoporosis:     1. Order labs   2. Recommend Prolia 60 mg SQ every 6 months in 10/26/22, then in 12/24  3. Recommend total calcium (diet and supplement) 1200 mg daily  4. Recommend Vit D 1000 IU twice per day   5. Exercise      Recommend  as tolerated assuming activities can be performed            safely with low risk of fall and/or injury.  6. Fall Risk Reduction      Recommend self-guided home assessment for  fall risk (elimination of clutter, cords, rugs, etc)      Recommend participation in piliates and/or t'ai chi as able  7. Stop smoking       Osteoarthritis/Joint pain     Recommend Tylenol as needed     Follow 6 months           Prolia injection    Consent: Written consent not obtained.  Risks and benefits: risks, benefits and alternatives were discussed  Consent given by: patient  Patient understanding: patient states understanding of the procedure being performed  Patient identity confirmed: verbally with patient  Sterile: ChloraPrep  Body area: Lt arm   Local anesthesia used: yes  Patient sedated: no  Preparation: patient was prepped and draped in the usual sterile fasion  Meds administered: prolia   Patient tolerance: patient tolerated the procedure well with no immediate complications.  Comments: no

## 2022-10-31 ENCOUNTER — Telehealth: Payer: Self-pay

## 2022-10-31 NOTE — Telephone Encounter (Signed)
Prolia:     Submitted PA renewal via CMM; Key: [id number] - PA Case ID: [id number]. PAQs and attached updated clinical notes-waiting on determination

## 2022-11-03 NOTE — Telephone Encounter (Signed)
Prolia:       PA approved Case ID: [id number], Effective: 08/01/2022 - 10/31/23

## 2022-11-15 ENCOUNTER — Telehealth: Payer: Self-pay | Admitting: Vascular Neurology

## 2022-11-15 NOTE — Telephone Encounter (Signed)
Name, strength, directions of requested refill(s):pregabalin (Lyrica) 200 MG capsule       How much medication is remaining: Completely Out    Pharmacy to send refill to or patient to pick up rx from office (mark requested pharmacy in BOLD):      [id number] - [place], [place] - [address]  [address]  [place] [address]  Phone: [phone] Fax: [phone]        Please mark "X" next to the preferred call back number:    Mobile: [phone] (mobile)    Home: [phone] (home)    Work: @WORKPHONE @        Medication refill request, see above. Thank you   Patient has been informed that medication refill requests should be called in up to one week prior to running out of medication.    Additional Notes:    Next Visit: 01/17/2023

## 2022-11-16 ENCOUNTER — Other Ambulatory Visit: Payer: Self-pay | Admitting: Vascular Neurology

## 2022-11-16 DIAGNOSIS — M5412 Radiculopathy, cervical region: Secondary | ICD-10-CM

## 2022-11-16 DIAGNOSIS — M542 Cervicalgia: Secondary | ICD-10-CM

## 2022-11-16 MED ORDER — PREGABALIN 200 MG PO CAPS
200.0000 mg | ORAL_CAPSULE | Freq: Three times a day (TID) | ORAL | 5 refills | Status: DC
Start: 2022-11-16 — End: 2023-01-17

## 2023-01-17 ENCOUNTER — Encounter: Payer: Self-pay | Admitting: Vascular Neurology

## 2023-01-17 ENCOUNTER — Ambulatory Visit: Payer: Medicare Other | Attending: Vascular Neurology | Admitting: Vascular Neurology

## 2023-01-17 VITALS — BP 121/80 | HR 77 | Resp 18 | Wt 90.0 lb

## 2023-01-17 DIAGNOSIS — M542 Cervicalgia: Secondary | ICD-10-CM | POA: Insufficient documentation

## 2023-01-17 DIAGNOSIS — Z79899 Other long term (current) drug therapy: Secondary | ICD-10-CM | POA: Insufficient documentation

## 2023-01-17 DIAGNOSIS — M5412 Radiculopathy, cervical region: Secondary | ICD-10-CM | POA: Insufficient documentation

## 2023-01-17 MED ORDER — PREGABALIN 200 MG PO CAPS
200.0000 mg | ORAL_CAPSULE | Freq: Three times a day (TID) | ORAL | 5 refills | Status: DC
Start: 2023-01-17 — End: 2023-08-15

## 2023-01-17 NOTE — Progress Notes (Signed)
 SLEEP DISORDER PROGRAM    [] Multidisplinary AFib Clinic  [x]  Neurology Sleep Clinic   [] Telemedicine     Subjective:      Patient ID: Ann Patterson    CC:  [] Sleep apnea evaluation [] Snoring [] Insomnia [] Excessive daytime sleepiness  [] Restless legs syndrome   [] Fatigue/tiredness [] CPAP follow up [x] Follow up    [] Other:     HPI    The patient was last seen about a year ago.  She has been doing fairly well and says everything is good with her except for having to move from the home she lives in.  She is currently looking for other places to live.    Her neck pain is significantly improved and she does not report significant problems from this.  There is a little bit of discomfort at times but not too bad.  She continues on Lyrica 200 mg 3 times a day without side effects.    She is thinking of going back to work at some point in the near future.  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    Lab Results   Component Value Date    TSH 1.12 03/25/2021       Objective:     Body mass index is 16.87 kg/m.    General: WDWN, NAD. Vital signs reviewed.  Psychiatric: Cooperative with exam, normal affect. Thought process and speech pattern normal.    NEUROLOGICAL EXAM    Mental Status: Awake, alert, oriented. Speech fluent without dysarthria. Follows commands well. Attention and concentration appear intact. Fund of knowledge appears appropriate for education.     Cranial Nerves: [x] PERRL   [x] Extraocular movements are full. No nystagmus seen. Face appears symmetric. Hearing is intact to conversational speech. [x] Cranial nerves II-XII otherwise appears intact.     Motor: [x] Moves extremities well, without UE drift. [x] No obvious focal weakness noted. [x] Bulk appears normal. [x] No tremor noted.    Coordination: [x] No obvious dysmetria.  [] Deferred    Gait: [x]  Appears normal, steady [] Deferred    Assessment:       1. Cervicalgia    2. Neck pain    3. Cervical radiculopathy    4. Medication management           Plan:      1.  Continue Lyrica 200 mg 3 times a day.  2.  Follow-up in 6 months.      The patient will return for follow-up as outlined above. If there are any problems with medications (if prescribed) or questions about any available test results, or if there are any new symptoms or changes in current symptoms, the patient is instructed to call the office.         Ann Patterson B. Ann Gardner, MD  Director, [facility] Sleep Disorders Program  Assistant Professor, Ann Patterson. Of [employer], Counsellor, Animal nutritionist, Sleep Medicine  Board Certified, Programmer, multimedia, Vascular Neurology        *This note was generated by the [employer] speech recognition and may contain inherent errors or omissions not intended by the user. Grammatical errors, random word insertions, deletions, pronoun errors and incomplete sentences are occasional consequences of this technology due to software limitations. Not all errors are caught or corrected. If there are questions or concerns about the content of this note or information contained within the body of this dictation they should be addressed directly with the author for clarification.*

## 2023-01-17 NOTE — Patient Instructions (Signed)
Our plan:     Continue Lyrica.    Today's Visit:      In today's visit, I reviewed your medications and records relating your health - prior testing, blood work, reports of other health care providers present in your electronic medical record.     If you have pertinent records from any non-[place] doctors that you would like to review, please have them sent to [place] or bring to them to your next office visit.     A copy of today's visit will be sent to your referring doctor and/or primary care doctor, if you have one listed in our system.    Let me know if there are things we could have done better for your office visit.    Patient satisfaction survey:      If you receive a patient satisfaction survey, I would greatly appreciate it if you would complete it. We value your feedback.     Contact me online:      Patient Portal online - Please sign up for MyChart -- this is the best way to communicate with your team here.  There is a messaging feature, where you can [place] messages anytime of day.  It is the best way to communicate with [place] and get test results, medication refills, or ask questions.     You can expect to get a response within 24-72 hours during weekdays.  If you do not receive a timely response, resend your request and inform us. My goal is for every question answered ever day.  Average response for a phone call maybe 3 days due to the volume we receive (which is why MyChart is preferred).    If you are having a medical emergency -- call 911, DO NOT SEND A MESSAGE THROUGH MYCHART.     Coupons for medication:      If you have any trouble affording your medications, check out www.goodrx.com for coupons and competitive prices in your area.  If you need further assistance, let us know so we can work with you and your insurance to make sure you get the best care.    Thank you for trusting me with your health.      Take care,     Artemio Aly, MD  [facility] Neurology

## 2023-03-20 ENCOUNTER — Other Ambulatory Visit (INDEPENDENT_AMBULATORY_CARE_PROVIDER_SITE_OTHER): Payer: Self-pay | Admitting: Internal Medicine

## 2023-03-20 ENCOUNTER — Other Ambulatory Visit (INDEPENDENT_AMBULATORY_CARE_PROVIDER_SITE_OTHER): Payer: Self-pay

## 2023-03-20 MED ORDER — PROLIA 60 MG/ML SC SOSY
60.0000 mg | PREFILLED_SYRINGE | Freq: Once | SUBCUTANEOUS | 1 refills | Status: DC
Start: 2023-03-20 — End: 2023-03-20

## 2023-03-25 LAB — COMPREHENSIVE METABOLIC PANEL
ALT: 13 [IU]/L (ref 0–32)
AST (SGOT): 29 [IU]/L (ref 0–40)
Albumin: 4 g/dL (ref 3.9–4.9)
Alkaline Phosphatase: 50 [IU]/L (ref 44–121)
BUN / Creatinine Ratio: 14 (ref 12–28)
BUN: 10 mg/dL (ref 8–27)
Bilirubin, Total: 0.3 mg/dL (ref 0.0–1.2)
CO2: 27 mmol/L (ref 20–29)
Calcium: 9.3 mg/dL (ref 8.7–10.3)
Chloride: 101 mmol/L (ref 96–106)
Creatinine: 0.72 mg/dL (ref 0.57–1.00)
Globulin, Total: 2.8 g/dL (ref 1.5–4.5)
Glucose: 98 mg/dL (ref 70–99)
Potassium: 4.2 mmol/L (ref 3.5–5.2)
Protein, Total: 6.8 g/dL (ref 6.0–8.5)
Sodium: 139 mmol/L (ref 134–144)
eGFR: 90 mL/min/{1.73_m2} (ref >59)

## 2023-03-28 ENCOUNTER — Telehealth (INDEPENDENT_AMBULATORY_CARE_PROVIDER_SITE_OTHER): Payer: Self-pay

## 2023-03-28 NOTE — Telephone Encounter (Signed)
Prolia- Delivery to [facility] scheduled for 03/30/23

## 2023-03-30 NOTE — Telephone Encounter (Signed)
Rec'vd prolia in office.    Ordered under (SP).    Patient has in person appointment scheduled on 04/03/2023 and will administer then.

## 2023-04-03 ENCOUNTER — Ambulatory Visit (INDEPENDENT_AMBULATORY_CARE_PROVIDER_SITE_OTHER): Payer: Medicare Other | Admitting: Internal Medicine

## 2023-04-03 ENCOUNTER — Encounter (INDEPENDENT_AMBULATORY_CARE_PROVIDER_SITE_OTHER): Payer: Self-pay | Admitting: Internal Medicine

## 2023-04-03 VITALS — BP 100/70 | HR 90 | Wt 100.0 lb

## 2023-04-03 DIAGNOSIS — M549 Dorsalgia, unspecified: Secondary | ICD-10-CM

## 2023-04-03 DIAGNOSIS — Z79899 Other long term (current) drug therapy: Secondary | ICD-10-CM

## 2023-04-03 DIAGNOSIS — M810 Age-related osteoporosis without current pathological fracture: Secondary | ICD-10-CM

## 2023-04-03 DIAGNOSIS — M542 Cervicalgia: Secondary | ICD-10-CM

## 2023-04-03 MED ORDER — DENOSUMAB 60 MG/ML SC SOSY
60.0000 mg | PREFILLED_SYRINGE | Freq: Once | SUBCUTANEOUS | Status: AC
Start: 2023-04-03 — End: 2023-04-03
  Administered 2023-04-03: 60 mg via SUBCUTANEOUS

## 2023-04-03 NOTE — Patient Instructions (Signed)
Osteoporosis:     1. Order labs   2. Recommend Prolia 60 mg SQ every 6 months on 04/02/2023, then in 6/25  3. Recommend total calcium (diet and supplement) 1200 mg daily  4. Recommend Vit D 1000 IU twice per day   5. Exercise      Recommend  as tolerated assuming activities can be performed            safely with low risk of fall and/or injury.  6. Fall Risk Reduction      Recommend self-guided home assessment for fall risk (elimination of clutter, cords, rugs, etc)      Recommend participation in piliates and/or t'ai chi as able  7. Stop smoking       Osteoarthritis/Joint pain     Recommend Tylenol as needed     Follow 6 months

## 2023-04-03 NOTE — Progress Notes (Signed)
Patient arrived to clinic for scheduled Prolia injection.  Physician order in Epic.  Medication was removed from refrigerator 30 minutes prior to injection.  Patient brought to room, name and DOB verified, patient made ware of medication given.  Medication verified with MD.  Injection administered per 6 rights of medication administration.  Patient waited 15 minutes after administration, no adverse reactions noted.    Pt c/o light headache, fainting, and need to lay down after Prolia injection. This MA helped the patient lying down on the exam bed. MD notified. Check BP 100/70. Pt stated she has not had lunch yet and will get lunch after the visit. Pt is not driving. After 15 minutes lying down. Pt stated she is ok to leave. MD aware.  Administered Prolia 60mg /ml subcutaneously to patient's upper L arm.    NDC: [id number]  LOT: [id number]  EXP: 10/22/2025

## 2023-04-03 NOTE — Progress Notes (Signed)
Initial Rheumatology Consultation    Chief Complaint:     Osteoporosis       HPI:   This patient is a 72 y.o. year old female with Osteoporosis ( started Prolia in 2020) chronic smoker, DJD of C-spine and L-spine, OA of Rt hip, Polio with left leg muscle atrophy referred by Shon Baton, MD to evaluate Osteoporosis.     She has Osteoporosis.  She started Prolia in 2020 with her GYN Dr. Ralene Bathe at [facility]. She had Prolia in 03/2020. She did not followed with Dr. Ralene Bathe at [facility].     She had Prolia 60mg  injection on 04/13/2021, 09/2021, 03/2022, 10/2022 at [facility]. No side effect.     She takes calcium 600 mg BID and Vit D 50,000 iu weekly.  She does walking exercise 30 minutes and does yard work. She had no history of cancer, chemotherapy or radiation therapy.    The patient's risk factors for osteoporosis include:  Had hysterectomy without hormone replacement therapy  History of fragility fracture of left wrist   No Family history of osteoporosis and hip fracture  Current smoker: tries to cut down smoking, less than half pack day, smoke for  50 years  No Alcohol usages drinks per day  She has weight loss 20 lbs in the past over 10 yrs. She has weight gain about 10 lbs. Her weight is about 100 lbs.         DXA bone Density study on 12/18/2020:   The AP Spine BMD=  g/cm2, T-Score= -0.0  The left femoral neck BMD=  g/cm2, T-Score= -1.9  The left hip BMD= g/cm2, T-Score=-2.6     Compared to study done 2021  there is no significant changes at the spine and a significant increase at the hip of 4.1%.    DXA bone Density study on 07/19/19:   The AP Spine BMD=  g/cm2, T-Score= -0.1  The left femoral neck BMD=  g/cm2, T-Score= -2.2  The left hip BMD= g/cm2, T-Score=-2.8     24% increase from baseline in 2012. Please note that lumbar spine BMD values may  be falsely elevated due to sclerosis.    She has back pain.         PMSH:     Past Medical History:   Diagnosis Date    Headache     Hyperlipidemia     Low back pain      Meningitis spinal 1956    spinal and "regular    Neuropathy     upper c spine issues, + carpal tunnel symptoms bilaterally    Pain     Pneumonia 2015    Polio     Polio 1956    Vertigo        Past Surgical History:   Procedure Laterality Date    APPENDECTOMY (OPEN)      72 years of age    BACK SURGERY  2005    plate in her neck-    CARPAL TUNNEL RELEASE      bilateral hands 1994 and 1996    EXTRACTION, CATARACT, PHACO, IOL Left 02/28/2019    Procedure: EXTRACTION, CATARACT, PHACO, IOL;  Surgeon: Belinda Block, MD;  Location: [facility] OR;  Service: Ophthalmology;  Laterality: Left;  LEFT  EYE PHACO WITH IOL    EXTRACTION, CATARACT, PHACO, IOL Right 03/14/2019    Procedure: EXTRACTION, CATARACT, PHACO, IOL;  Surgeon: Belinda Block, MD;  Location: [facility] OR;  Service: Ophthalmology;  Laterality: Right;  RIGHT  EYE PHACO WITH IOL    FRACTURE SURGERY Left     at 72 years of age    HYSTERECTOMY      1995    ORIF, FINGER Left 03/02/2015    Procedure: ORIF, FINGER;  Surgeon: Kellie Simmering, MD;  Location: [facility] OR;  Service: Plastics;  Laterality: Left;  LEFT SMALL FINGER METACARPAL ORIF    REPAIR, HAND, NERVE  12/10/2012    Procedure: REPAIR, HAND, NERVE;  Surgeon: Kellie Simmering, MD;  Location: [facility];  Service: Plastics;  Laterality: Left;  LEFT SMALL FINGER EXPLORATION; REPAIR DIGITAL NERVE    REPAIR, LIGAMENT Left 03/02/2015    Procedure: REPAIR, LIGAMENT;  Surgeon: Kellie Simmering, MD;  Location: [facility] OR;  Service: Plastics;  Laterality: Left;  LEFT MIDDLE FINGER RADIAL COLLATERAL LIGAMENT REPAIR       Allergies:     Allergies   Allergen Reactions    Gadolinium      Heart fluttering    Iodine      Heart fluttering    Penicillins Hives       Meds:     Current Outpatient Medications:     ciclopirox (PENLAC) 8 % solution, APPLY TO AFFECTED NAILS NIGHTLY UNTIL CONDITION IMPROVES AS DIRECTED, Disp: , Rfl:     fluticasone (FLONASE) 50 MCG/ACT nasal spray, APPLY ONE SPRAY IN EACH  NOSTRIL EVERY DAY, Disp: , Rfl:     loratadine (Claritin) 10 MG tablet, Take 1 tablet (10 mg total) by mouth daily, Disp: 30 tablet, Rfl: 0    oxybutynin XL (DITROPAN-XL) 5 MG 24 hr tablet, Take 1 tablet (5 mg) by mouth daily, Disp: , Rfl:     pravastatin (PRAVACHOL) 20 MG tablet, Take 1 tablet (20 mg) by mouth nightly, Disp: , Rfl:     pregabalin (Lyrica) 200 MG capsule, Take 1 capsule (200 mg) by mouth 3 (three) times daily, Disp: 90 capsule, Rfl: 5    sodium chloride (MURO 128) 5 % ophthalmic solution, 1 drop as needed, Disp: , Rfl:     vitamin D, ergocalciferol, (DRISDOL) 50000 UNIT Cap, Take 1 capsule (50,000 Units) by mouth once a week, Disp: 12 capsule, Rfl: 3    betamethasone dipropionate (DEL-BETA) 0.05 % cream, Apply topically 2 (two) times daily (Patient not taking: Reported on 04/03/2023), Disp: , Rfl:     clindamycin (CLEOCIN) 300 MG capsule, Take 1 capsule (300 mg) by mouth 3 (three) times daily (Patient not taking: Reported on 04/03/2023), Disp: , Rfl:     mupirocin (BACTROBAN) 2 % ointment, Apply topically every 8 (eight) hours (Patient not taking: Reported on 04/03/2023), Disp: , Rfl:     raloxifene (EVISTA) 60 MG tablet, Take 1 tablet (60 mg total) by mouth daily. (Patient not taking: Reported on 04/03/2023), Disp: 30 tablet, Rfl: 3    FH:     Family History   Problem Relation Name Age of Onset    Myocardial Infarction Mother      Diverticulitis Sister      Diabetes Maternal Grandmother      Brain cancer Maternal Grandfather      Breast cancer Neg Hx         SH:     Social History     Socioeconomic History    Marital status: Single   Tobacco Use    Smoking status: Every Day     Current packs/day: 0.50     Average packs/day: 0.5 packs/day for 30.0 years (15.0  ttl pk-yrs)     Types: Cigarettes    Smokeless tobacco: Never   Vaping Use    Vaping status: Never Used   Substance and Sexual Activity    Alcohol use: Yes     Comment: social  wine    Drug use: Not Currently     Frequency: 2.0 times per week      Types: Marijuana     Comment: uses fro pain rellief  will hold use 02/28/15       ROS:   All other systems reviewed and negative except as described above.        PHYSICAL EXAM:     Vitals:    04/03/23 1350   BP: 114/73   Pulse: 90       General appearance - alert, well appearing, and in no distress  Mental status - alert, oriented to person, place, and time  Eyes - extraocular eye movements intact  Back exam - + tenderness of para L-spine  Neurological - alert, oriented, normal speech, no focal findings or movement disorder noted  Extremities - peripheral pulses normal, no pedal edema, no clubbing or cyanosis  Skin - normal coloration and turgor, no rashes, no suspicious skin lesions noted  Musculoskeletal - No tenderness of MCPs and PIPs. Good range of motion of shoulders, elbows, wrists and small joints of hands. Good range of motion of hips, knees and ankles.  Knees crepitus with range of motion of knees without effusions. Ankles without effusions.  No tenderness of MTPs or effusions.   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             Labs:     Component      Latest Ref Rng & Units 03/18/2018   WBC      3.10 - 9.50 x10 3/uL 9.24   Hemoglobin      11.4 - 14.8 g/dL 16.1   Hematocrit      34.7 - 43.7 % 36.1   Platelet Count      142 - 346 x10 3/uL 206   RBC      3.90 - 5.10 x10 6/uL 4.09   MCV      78.0 - 96.0 fL 88.3   MCH      25.1 - 33.5 pg 29.8   MCHC      31.5 - 35.8 g/dL 09.6   RDW      11 - 15 % 19 (H)   MPV      8.9 - 12.5 fL 10.2   Neutrophils      None % 45.9   Lymphocytes Automated      None % 41.5   Monocytes      None % 9.6   Eosinophils Automated      None % 2.1   Basophils Automated      None % 0.6   Immature Granulocytes      None % 0.3   Nucleated RBC      0.0 - 0.0 /100 WBC 0.0   Neutrophils Absolute      1.10 - 6.33 x10 3/uL 4.24   Lymphocytes Absolute Automated      0.42 - 3.22 x10 3/uL 3.83 (H)   Monocytes Absolute Automated      0.21 - 0.85 x10 3/uL 0.89 (H)   Eosinophils Absolute Automated       0.00 - 0.44 x10 3/uL 0.19   Basophils Absolute Automated      0.00 -  0.08 x10 3/uL 0.06   Immature Granulocytes Absolute      0.00 - 0.07 x10 3/uL 0.03   Nucleated RBC Absolute      0.00 - 0.00 x10 3/uL 0.00   Glucose      70 - 100 mg/dL 93   BUN      7.0 - 16.1 mg/dL 09.6   Creatinine      0.6 - 1.0 mg/dL 0.8   Calcium      8.5 - 10.5 mg/dL 8.9   Sodium      045 - 145 mEq/L 141   Potassium      3.5 - 5.1 mEq/L 3.7   Chloride      100 - 111 mEq/L 106   CO2      22 - 29 mEq/L 22       Radiology:     DXA bone density axial skeleton [IMG572] (Order [id number])  Status: Final result     Study Result    Narrative & Impression   INDICATION: Osteoporosis screening.  Menopause     INTERPRETATION:  DEXA methodology was utilized to evaluate the bone  mineral density.  The measurements are as follows:     PRIOR: 04/25/2011     Left hip- total:  BMD:  0.604 g/cm2.  T-score:  -2.8  Z- score: -1.4  7.3% increase from baseline.     Left femoral neck:  BMD: 0.610 g/cm/2  T-score: -2.2  Z- score: -0.5     Spine (L1-L4):  BMD:  1.032 g/cm2.  T-score:  -0.1  Z- score: 1.7  24% increase from baseline. Please note that lumbar spine BMD values may  be falsely elevated due to sclerosis.        IMPRESSION:    Osteoporosis of the left hip. Osteopenia of the left femoral  neck.       ASSESSMENT:   Ann Patterson is a 72 y.o. female with Osteoporosis ( started Prolia in 2020) chronic smoker, DJD of C-spine and L-spine, OA of Rt hip, Polio with left leg muscle atrophy.    She has Osteoporosis and had Prolia ( in 03/2020, 03/2021, 09/2021, 03/2022, 10/2022)    PLAN:           Osteoporosis:     1. Order labs   2. Recommend Prolia 60 mg SQ every 6 months on 04/02/2023, then in 6/25  3. Recommend total calcium (diet and supplement) 1200 mg daily  4. Recommend Vit D 1000 IU twice per day   5. Exercise      Recommend  as tolerated assuming activities can be performed            safely with low risk of fall and/or injury.  6. Fall Risk  Reduction      Recommend self-guided home assessment for fall risk (elimination of clutter, cords, rugs, etc)      Recommend participation in piliates and/or t'ai chi as able  7. Stop smoking       Osteoarthritis/Joint pain     Recommend Tylenol as needed     Follow 6 months           Prolia injection    Consent: Written consent not obtained.  Risks and benefits: risks, benefits and alternatives were discussed  Consent given by: patient  Patient understanding: patient states understanding of the procedure being performed  Patient identity confirmed: verbally with patient  Sterile: ChloraPrep  Body area: Lt arm   Local anesthesia used:  yes  Patient sedated: no  Preparation: patient was prepped and draped in the usual sterile fasion  Meds administered: prolia   Patient tolerance: patient tolerated the procedure well with no immediate complications.  Comments: no

## 2023-06-27 ENCOUNTER — Other Ambulatory Visit (INDEPENDENT_AMBULATORY_CARE_PROVIDER_SITE_OTHER): Payer: Self-pay

## 2023-06-27 NOTE — Telephone Encounter (Signed)
 Received a fax from [facility] request for Vit D   LOV: 04/03/2023

## 2023-06-30 MED ORDER — VITAMIN D (ERGOCALCIFEROL) 1.25 MG (50000 UT) PO CAPS: 50000.0000 [IU] | ORAL_CAPSULE | ORAL | 3 refills | Status: DC

## 2023-07-01 ENCOUNTER — Emergency Department
Admission: EM | Admit: 2023-07-01 | Discharge: 2023-07-01 | Disposition: A | Discharge status: Home / Self Care | Attending: Emergency Medicine | Admitting: Emergency Medicine

## 2023-07-01 DIAGNOSIS — F17210 Nicotine dependence, cigarettes, uncomplicated: Secondary | ICD-10-CM | POA: Insufficient documentation

## 2023-07-01 DIAGNOSIS — F4321 Adjustment disorder with depressed mood: Secondary | ICD-10-CM | POA: Insufficient documentation

## 2023-07-01 DIAGNOSIS — Z634 Disappearance and death of family member: Secondary | ICD-10-CM | POA: Insufficient documentation

## 2023-07-01 MED ORDER — HYDROXYZINE HCL 10 MG PO TABS
10.0000 mg | ORAL_TABLET | Freq: Once | ORAL | Status: DC
Start: 2023-07-01 — End: 2023-07-01
  Filled 2023-07-01: qty 1

## 2023-07-01 NOTE — ED Notes (Signed)
 Sw called to bedside with MD Chopard.

## 2023-07-01 NOTE — ED Triage Notes (Addendum)
 Pt initially BIBA from home at hotel reporting anxiety/panic attack d/t son's recent death. Pt reports she doesn't want medical care and has called family to pick her up. MD Solberg at bedside. Pt requesitng medication to help relax, agreed to EKG. Denies any pain or medical needs at this time. Pt is tearful, speaking in full and clear sentences, ambulating steadily and independently with her cane. Chest movement symmetrical. A&Ox4.

## 2023-07-01 NOTE — ED Provider Notes (Signed)
 [facility] EMERGENCY DEPARTMENT H&P           CLINICAL INFORMATION        HPI:      Chief Complaint: Panic Attack  .    Ann Patterson is a 73 y.o. female who presents with panic attack.  Patient's son died this morning, was pronounced about an hour ago.  Patient was having panic attacks to call 911.  Notes her other son just died as well.    Denies any chest pain, shortness of breath.  Reports feeling heartbroken.  Denies SI or HI.    History obtained from: Patient    Triage Note: Pt biba from home for anxiety/panic attack, recent death of son today, pt appears distressed and mourning. -cp, -sob, A&O x4, no respiratory distress. (07/01/23 1239)      Physical Exam:      Vitals:    07/01/23 1239   BP: (!) 163/102   Pulse: 85   Resp: 20   Temp: 98.5 F (36.9 C)   TempSrc: Oral   SpO2: 97%   Weight: 45.4 kg   Height: 5' 1 (1.549 m)       Nursing notes and vitals reviewed.     Constitutional: alert, no acute distress  Eyes: EOM intact, conjugate gaze  HEENT: Neck supple with normal ROM  Cardiac: Warm and well perfused, no JVD, no edema  Respiratory: Normal rate, No increased work of breathing   Abdomen/GI:  nondistended  Neurologic: alert, oriented, conversant, moves all extremities, stable gait  MSK: No deformities or crepitus, full ROM of joints without pain or limitation  Skin: warm and dry, no obvious rashes  Psychiatric: pleasant and cooperative            PAST HISTORY        Primary Care Provider: Din, Anwar U, MD        PMH/PSH:    .     Medical History[1]    She has a past surgical history that includes Hysterectomy; APPENDECTOMY (OPEN); Carpal tunnel release; Back surgery (2005); Fracture surgery (Left); REPAIR, HAND, NERVE (12/10/2012); REPAIR, LIGAMENT (Left, 03/02/2015); ORIF, FINGER (Left, 03/02/2015); EXTRACTION, CATARACT, PHACO, IOL (Left, 02/28/2019); and EXTRACTION, CATARACT, PHACO, IOL (Right, 03/14/2019).      Social/Family History:      She reports that she has  been smoking cigarettes. She has a 15 pack-year smoking history. She has never used smokeless tobacco. She reports current alcohol  use. She reports that she does not currently use drugs after having used the following drugs: Marijuana. Frequency: 2.00 times per week.    Family History[2]      Listed Medications on Arrival:    .     Home Medications       Med List Status: In Progress Set By: Juli Fees, RN at 07/01/2023  1:31 PM              betamethasone  dipropionate (DEL-BETA) 0.05 % cream     Apply topically 2 (two) times daily     Patient not taking: Reported on 04/03/2023     ciclopirox (PENLAC) 8 % solution     APPLY TO AFFECTED NAILS NIGHTLY UNTIL CONDITION IMPROVES AS DIRECTED     clindamycin  (CLEOCIN ) 300 MG capsule     Take 1 capsule (300 mg) by mouth 3 (three) times daily     Patient not taking: Reported on 04/03/2023     denosumab  (PROLIA ) 60 MG/ML Solution Prefilled Syringe subcutaneous injection  INJECT 60 MG INTO THE SKIN ONCE FOR 1 DOSE EVERY 6 MONTHS     denosumab  (Prolia ) 60 MG/ML Solution Prefilled Syringe subcutaneous injection (Expired)     Inject 60 mg into the skin once for 1 dose     fluticasone (FLONASE) 50 MCG/ACT nasal spray     APPLY ONE SPRAY IN EACH NOSTRIL EVERY DAY     loratadine  (Claritin ) 10 MG tablet     Take 1 tablet (10 mg total) by mouth daily     mupirocin (BACTROBAN) 2 % ointment     Apply topically every 8 (eight) hours     Patient not taking: Reported on 04/03/2023     oxybutynin  XL (DITROPAN -XL) 5 MG 24 hr tablet     Take 1 tablet (5 mg) by mouth daily     pravastatin (PRAVACHOL) 20 MG tablet     Take 1 tablet (20 mg) by mouth nightly     pregabalin  (Lyrica ) 200 MG capsule     Take 1 capsule (200 mg) by mouth 3 (three) times daily     raloxifene  (EVISTA ) 60 MG tablet     Take 1 tablet (60 mg total) by mouth daily.     Patient not taking: Reported on 04/03/2023     sodium chloride  (MURO 128) 5 % ophthalmic solution     1 drop as needed     vitamin D , ergocalciferol ,  (DRISDOL ) 50000 UNIT Cap     Take 1 capsule (50,000 Units) by mouth once a week           Allergies: She is allergic to gadolinium, iodine, and penicillins.            VISIT INFORMATION        Medications Given in the ED:    .     ED Medication Orders (From admission, onward)      Start Ordered     Status Ordering Provider    07/01/23 1325 07/01/23 1324  hydrOXYzine  (ATARAX ) tablet 10 mg  Once        Route: Oral  Ordered Dose: 10 mg       Acknowledged Ehren Berisha G              Procedures:      Procedures      Interpretations:      O2 sat-           saturation: 97 %; Oxygen use: room air; Interpretation: Normal      EKG reviewed and interpreted by me:  Rhythm: Sinus  Rate: 77  Intervals: Normal  Axis: Normal  Ectopy: None  ST changes: No acute ischemic changes                          RESULTS        Lab Results:      Results       ** No results found for the last 24 hours. **                Radiology Results:      No orders to display              Visit date: (Not on file)      CLINICAL SUMMARY           Diagnosis:    .     Final diagnoses:   Grief reaction         MDM/ED Course:  73 year old female presents after son passed away with grief reaction.  Denies any chest pain, shortness of breath, other associated symptoms.  On exam, no leg swelling, no murmurs, well-appearing other than intermittently agitated and tearful in regards to son's death.  No acute medical issues seen.  Patient discharged to begin grieving process.  No SI or HI but discussed return specifically for these.  Recommended follow-up with counseling.             I am the primary ED provider for this patient           Disposition Decision:      Discharge    Standard discharge instructions including symptomatic management, expected course of disease as outpatient, return instructions, follow-up plan discussed with patient.  Patient confirmed understanding of instructions               Scribe Attestation:             Parts of this note were  generated by the Epic EMR system/ Dragon speech recognition and may contain inherent errors or omissions not intended by the user. Grammatical errors, random word insertions, deletions, pronoun errors and incomplete sentences are occasional consequences of this technology due to software limitations. Not all errors are caught or corrected. If there are questions or concerns about the content of this note or information contained within the body of this dictation they should be addressed directly with the author for clarification.         [1]   Past Medical History:  Diagnosis Date    Headache     Hyperlipidemia     Low back pain     Meningitis spinal 1956    spinal and regular    Neuropathy     upper c spine issues, + carpal tunnel symptoms bilaterally    Pain     Pneumonia 2015    Polio     Polio 1956    Vertigo    [2]   Family History  Problem Relation Name Age of Onset    Myocardial Infarction Mother      Diverticulitis Sister      Diabetes Maternal Grandmother      Brain cancer Maternal Grandfather      Breast cancer Neg Hx          Rachell Lamar MATSU, MD  07/01/23 1346

## 2023-07-04 ENCOUNTER — Ambulatory Visit: Payer: Medicare Other | Admitting: Vascular Neurology

## 2023-08-14 ENCOUNTER — Other Ambulatory Visit: Payer: Self-pay | Admitting: Vascular Neurology

## 2023-08-14 DIAGNOSIS — M542 Cervicalgia: Secondary | ICD-10-CM

## 2023-08-14 DIAGNOSIS — M5412 Radiculopathy, cervical region: Secondary | ICD-10-CM

## 2023-08-18 ENCOUNTER — Telehealth: Payer: Self-pay | Admitting: Vascular Neurology

## 2023-08-18 NOTE — Progress Notes (Unsigned)
 Chief Complaint:  No chief complaint on file.    Verbal consent has been obtained from the patient to conduct a telemedicine visit encounter via a two-way, synchronous, real- time, audio visual interactive communication system.  The patient was located in [place] at the time of the visit.    Interval History 08/18/2023  Ann Patterson is a 73 y.o. female who presents for follow up of ***.  She was last seen by Dr. Courtland Ditch via telehealth on 01/17/2023.  At that time she was tolerating Lyrica  well and her neck pain had significantly improved.  Dr. Courtland Ditch recommended continuation of Lyrica  200 mg BID.    Patient is tolerating ***    ROS is negative other than complaints mentioned above.  PHYSICAL EXAM  There were no vitals taken for this visit.    Neuro Exam:    CN II-XII grossly intact.  Regards examiner with conjugate gaze.  EOMI intact.  No facial asymmetry.  Motor exam grossly normal.  Moves all extremities equally.  Gait normal to all modalities.      RECORDS REVIEWED:  -OV notes by Dr. Courtland Ditch, NP [facility]    Imaging:  CT Cervical spine w/o contrast 03/18/2018 (Independently reviewed images)  IMPRESSION:   Postoperative and degenerative changes of the cervical spine. No acute  fracture or traumatic malalignment.    HCT w/o contrast 03/18/2018 (Independently reviewed images)  FINDINGS:      Atherosclerotic calcifications. Mild supratentorial white matter changes  suggestive of chronic microvascular ischemia.     No acute intracranial hemorrhage, mass effect, loss of gray-white matter  differentiation, or evidence of acute ventricular outflow obstruction.      No depressed skull fracture or aggressive appearing osseous lesions.      The visualized paranasal sinuses and temporal bone structures are  well-aerated.      IMPRESSION:   No acute intracranial abnormality.     IMPRESSION:  Ann Patterson is a 73 y.o. female who presents for follow up of ***     Cervicalgia   Cervical radiculopathy   Neck pain    Medication management    PLAN:  -Continue Lyrica  200 mg three time daily.      An attending physician was available in a supervisory capacity during this visit.    I spent *** minutes in patient care.

## 2023-08-18 NOTE — Telephone Encounter (Signed)
 LVM: Called pt to try to schedule them in with one of our sleep providers for a medication refill. I advise pt to call us  back as soon as possible to get scheduled in.

## 2023-08-21 ENCOUNTER — Ambulatory Visit

## 2023-08-21 ENCOUNTER — Telehealth: Payer: Self-pay

## 2023-08-21 ENCOUNTER — Ambulatory Visit: Admitting: Physician Assistant

## 2023-08-21 VITALS — Ht 61.0 in | Wt 100.0 lb

## 2023-08-21 DIAGNOSIS — Z79899 Other long term (current) drug therapy: Secondary | ICD-10-CM

## 2023-08-21 DIAGNOSIS — M542 Cervicalgia: Secondary | ICD-10-CM

## 2023-08-21 DIAGNOSIS — M5412 Radiculopathy, cervical region: Secondary | ICD-10-CM

## 2023-08-21 NOTE — Telephone Encounter (Signed)
 Nurse/writer contacted patient to triage patient for virtual appointment. patient scored a 4, before initating the remaining of the questionnaire, patient wanted to disconnect the phone to log onto the virtual call due to her has issues logging in earlier. Patient joined the virtual call and the provider joined as well. Nurse/writer advised the provider during the call to finish the questionnaire or ask the patient if she was okay with behavioral health resources/ reaching out. Patient verbalized she was okay with them reaching out. Nurse/writer contacted behavioral health to initiate the education/support services. Nurse/writer spoke with cristi, she suggested to finish the phq9 or ask the provider if the provider noticed anything abnormal to initiate a another call back to behavioral health. Nurse/writer spoke to provider Amy, Amy had no concerns of the patient's safety, patient is upset about current living conditions. Nurse/writer confirmed with Provider Amy, no need to initiate behavorial health call back.

## 2023-08-21 NOTE — Progress Notes (Unsigned)
 PMP checked.  Last filled on 08/16/2023.  No unusual activity noted.

## 2023-08-21 NOTE — Telephone Encounter (Signed)
 Patient called in because she was having trouble logging into her video visit.

## 2023-08-22 NOTE — Patient Instructions (Signed)
 PLAN:  -Continue Lyrica  200 mg three time daily (already refilled by Dr. Courtland Ditch)  -Follow up in about 6 months for safe medical prescribing.

## 2023-09-05 ENCOUNTER — Other Ambulatory Visit: Payer: Self-pay

## 2023-09-13 ENCOUNTER — Telehealth: Payer: Self-pay | Admitting: Internal Medicine

## 2023-09-13 NOTE — Telephone Encounter (Signed)
 Copied from CRM [id number]. Topic: Clinical Support - Medical Question  >> Sep 13, 2023 12:11 PM Dena PARAS wrote:  Ann Patterson called about Clinical Support - Medical Question.  Additional details:  >> Sep 13, 2023 12:13 PM Dena PARAS wrote:  Note to scheduling agent: This telephone encounter should be sent as normal priority to the appropriate clinical/nurse pool.    This telephone encounter is being sent on behalf of Ann Patterson. The patient is scheduled to have labs drawn in a few weeks and was informed by their provider that they need to have labs done before their next visit. No lab order was found in the system for the patient.  The patient is going to: [email or web address]   Fax number is [phone]  Thank you

## 2023-09-15 ENCOUNTER — Telehealth (INDEPENDENT_AMBULATORY_CARE_PROVIDER_SITE_OTHER): Payer: Self-pay | Admitting: Internal Medicine

## 2023-09-15 DIAGNOSIS — M810 Age-related osteoporosis without current pathological fracture: Secondary | ICD-10-CM

## 2023-09-15 NOTE — Telephone Encounter (Signed)
 Labs faxed to patient provided fax number.

## 2023-09-15 NOTE — Telephone Encounter (Signed)
 Order labs.

## 2023-09-15 NOTE — Telephone Encounter (Signed)
Lab order faxed to fax number provided.

## 2023-09-19 ENCOUNTER — Telehealth: Payer: Self-pay

## 2023-09-19 ENCOUNTER — Telehealth: Payer: Self-pay | Admitting: Vascular Neurology

## 2023-09-19 NOTE — Telephone Encounter (Signed)
 Pregabalin /Lyrica  PA submitted to [employer].       KeyBETHA NIEVES)  PA Case ID #: [id number]  Rx #: [id number]    Your information has been submitted to [employer]. [employer] will review the request and will issue a decision, typically within 1-3 days from your submission. You can check the updated outcome later by reopening this request.  If [employer] has not responded in 1-3 days or if you have any questions about your ePA request, please contact [employer] at [phone]. If you think there may be a problem with your PA request, use our live chat feature at the bottom right.

## 2023-09-19 NOTE — Telephone Encounter (Addendum)
 Pt contacted the call center requesting a PA for pregabalin  (Lyrica ) 200 MG capsule. Pt is also requesting  a prescription be sent for brand name only to [facility] [phone]. Can someone please follow up?

## 2023-09-20 ENCOUNTER — Telehealth: Payer: Self-pay

## 2023-09-20 ENCOUNTER — Other Ambulatory Visit: Payer: Self-pay

## 2023-09-20 LAB — COMPREHENSIVE METABOLIC PANEL
ALT: 10 IU/L (ref 0–32)
AST (SGOT): 25 IU/L (ref 0–40)
Albumin: 3.9 g/dL (ref 3.9–4.9)
Alkaline Phosphatase: 51 IU/L (ref 44–121)
BUN / Creatinine Ratio: 12 (ref 12–28)
BUN: 9 mg/dL (ref 8–27)
Bilirubin, Total: 0.3 mg/dL (ref 0.0–1.2)
CO2: 25 mmol/L (ref 20–29)
Calcium: 9.3 mg/dL (ref 8.7–10.3)
Chloride: 101 mmol/L (ref 96–106)
Creatinine: 0.75 mg/dL (ref 0.57–1.00)
Globulin, Total: 3.5 g/dL (ref 1.5–4.5)
Glucose: 93 mg/dL (ref 70–99)
Potassium: 4.9 mmol/L (ref 3.5–5.2)
Protein, Total: 7.4 g/dL (ref 6.0–8.5)
Sodium: 141 mmol/L (ref 134–144)
eGFR: 86 mL/min/1.73 (ref >59)

## 2023-09-20 LAB — VITAMIN D, 25 OH, TOTAL: Vitamin D 25-Hydroxy: 105 ng/mL — ABNORMAL HIGH (ref 30.0–100.0)

## 2023-09-20 MED FILL — Denosumab Inj Soln Prefilled Syringe 60 MG/ML: SUBCUTANEOUS | 180 days supply | Qty: 1 | Fill #0 | Status: CN

## 2023-09-20 NOTE — Telephone Encounter (Signed)
 Attempted to submit a PA for Pregabalin  200mg  capsules via covermymeds    KEY- [id number]   OUTCOME- The patient currently has access to the requested medication and a Prior Authorization is not needed for the patient/medication.

## 2023-09-22 ENCOUNTER — Other Ambulatory Visit: Payer: Self-pay

## 2023-09-25 ENCOUNTER — Other Ambulatory Visit: Payer: Self-pay

## 2023-09-27 ENCOUNTER — Ambulatory Visit (INDEPENDENT_AMBULATORY_CARE_PROVIDER_SITE_OTHER): Payer: Medicare Other | Admitting: Internal Medicine

## 2023-09-27 DIAGNOSIS — M810 Age-related osteoporosis without current pathological fracture: Secondary | ICD-10-CM

## 2023-09-27 DIAGNOSIS — M7072 Other bursitis of hip, left hip: Secondary | ICD-10-CM | POA: Insufficient documentation

## 2023-09-27 DIAGNOSIS — M542 Cervicalgia: Secondary | ICD-10-CM

## 2023-09-27 DIAGNOSIS — Z79899 Other long term (current) drug therapy: Secondary | ICD-10-CM

## 2023-09-27 DIAGNOSIS — M549 Dorsalgia, unspecified: Secondary | ICD-10-CM

## 2023-09-27 MED ORDER — TRIAMCINOLONE ACETONIDE 40 MG/ML IJ SUSP
40.0000 mg | Freq: Once | INTRAMUSCULAR | Status: AC
Start: 2023-09-27 — End: 2023-09-27
  Administered 2023-09-27: 40 mg via INTRAMUSCULAR

## 2023-09-27 NOTE — Patient Instructions (Signed)
 Osteoporosis:     1. Order labs   2. Recommend Prolia  60 mg SQ every 6 months on 04/02/2023, then in 6/25  3. Recommend total calcium (diet and supplement) 1200 mg daily  4. Recommend Vit D 1000 IU twice per day   5. Exercise      Recommend  as tolerated assuming activities can be performed            safely with low risk of fall and/or injury.  6. Fall Risk Reduction      Recommend self-guided home assessment for fall risk (elimination of clutter, cords, rugs, etc)      Recommend participation in piliates and/or t'ai chi as able  7. Stop smoking       Osteoarthritis/Joint pain     Recommend Tylenol  as needed       left ischial bursitis       Follow 6 months

## 2023-09-27 NOTE — Progress Notes (Signed)
 Initial Rheumatology Consultation    Chief Complaint:     Osteoporosis       HPI:   This patient is a 73 y.o. year old female with Osteoporosis ( started Prolia  in 2020) chronic smoker, DJD of C-spine and L-spine, OA of Rt hip, Polio with left leg muscle atrophy referred by Din, Anwar U, MD to evaluate Osteoporosis.     She has Osteoporosis.  She started Prolia  in 2020 with her GYN Dr. Ian Cahill at [facility]. She had Prolia  in 03/2020. She did not followed with Dr. Ian Cahill at [facility].     She had Prolia  60mg  injection on 04/13/2021, 09/2021, 03/2022, 10/2022, 03/2023 at [facility]. No side effect.     She takes calcium 600 mg BID and Vit D 50,000 iu weekly.  She does walking exercise 30 minutes and does yard work. She had no history of cancer, chemotherapy or radiation therapy.    The patient's risk factors for osteoporosis include:  Had hysterectomy without hormone replacement therapy  History of fragility fracture of left wrist   No Family history of osteoporosis and hip fracture  Current smoker: tries to cut down smoking, less than half pack day, smoke for  50 years  No Alcohol usages drinks per day  She has weight loss 20 lbs in the past over 10 yrs. She has weight gain about 10 lbs. Her weight is about 100 lbs.     She had not down DXA.     DXA bone Density study on 12/18/2020:   The AP Spine BMD=  g/cm2, T-Score= -0.0  The left femoral neck BMD=  g/cm2, T-Score= -1.9  The left hip BMD= g/cm2, T-Score=-2.6     Compared to study done 2021  there is no significant changes at the spine and a significant increase at the hip of 4.1%.    DXA bone Density study on 07/19/19:   The AP Spine BMD=  g/cm2, T-Score= -0.1  The left femoral neck BMD=  g/cm2, T-Score= -2.2  The left hip BMD= g/cm2, T-Score=-2.8     24% increase from baseline in 2012. Please note that lumbar spine BMD values may  be falsely elevated due to sclerosis.    She has more back pain and left buttock area.          PMSH:     Past Medical History:   Diagnosis  Date    Headache     Hyperlipidemia     Low back pain     Meningitis spinal 1956    spinal and regular    Neuropathy     upper c spine issues, + carpal tunnel symptoms bilaterally    Pain     Pneumonia 2015    Polio     Polio 1956    Vertigo        Past Surgical History:   Procedure Laterality Date    APPENDECTOMY (OPEN)      73 years of age    BACK SURGERY  2005    plate in her neck-    CARPAL TUNNEL RELEASE      bilateral hands 1994 and 1996    EXTRACTION, CATARACT, PHACO, IOL Left 02/28/2019    Procedure: EXTRACTION, CATARACT, PHACO, IOL;  Surgeon: Butler Deatrice ORN, MD;  Location: [facility] OR;  Service: Ophthalmology;  Laterality: Left;  LEFT  EYE PHACO WITH IOL    EXTRACTION, CATARACT, PHACO, IOL Right 03/14/2019    Procedure: EXTRACTION, CATARACT, PHACO, IOL;  Surgeon:  Silk, Deatrice ORN, MD;  Location: [facility] OR;  Service: Ophthalmology;  Laterality: Right;  RIGHT  EYE PHACO WITH IOL    FRACTURE SURGERY Left     at 73 years of age    HYSTERECTOMY      1995    ORIF, FINGER Left 03/02/2015    Procedure: ORIF, FINGER;  Surgeon: Bolling Oh, MD;  Location: [facility] OR;  Service: Plastics;  Laterality: Left;  LEFT SMALL FINGER METACARPAL ORIF    REPAIR, HAND, NERVE  12/10/2012    Procedure: REPAIR, HAND, NERVE;  Surgeon: Bolling Oh, MD;  Location: [facility];  Service: Plastics;  Laterality: Left;  LEFT SMALL FINGER EXPLORATION; REPAIR DIGITAL NERVE    REPAIR, LIGAMENT Left 03/02/2015    Procedure: REPAIR, LIGAMENT;  Surgeon: Mehan, Vineet, MD;  Location: [facility] OR;  Service: Plastics;  Laterality: Left;  LEFT MIDDLE FINGER RADIAL COLLATERAL LIGAMENT REPAIR       Allergies:     Allergies   Allergen Reactions    Gadolinium      Heart fluttering    Iodine      Heart fluttering    Penicillins Hives       Meds:     Current Outpatient Medications:     betamethasone  dipropionate (DEL-BETA) 0.05 % cream, Apply topically 2 (two) times daily (Patient not taking: Reported on 04/03/2023), Disp:  , Rfl:     ciclopirox (PENLAC) 8 % solution, APPLY TO AFFECTED NAILS NIGHTLY UNTIL CONDITION IMPROVES AS DIRECTED, Disp: , Rfl:     clindamycin  (CLEOCIN ) 300 MG capsule, Take 1 capsule (300 mg) by mouth 3 (three) times daily (Patient not taking: Reported on 04/03/2023), Disp: , Rfl:     denosumab  (PROLIA ) 60 MG/ML Solution Prefilled Syringe subcutaneous injection, INJECT 60 MG INTO THE SKIN ONCE FOR 1 DOSE EVERY 6 MONTHS, Disp: 1 mL, Rfl: 1    fluticasone (FLONASE) 50 MCG/ACT nasal spray, APPLY ONE SPRAY IN EACH NOSTRIL EVERY DAY, Disp: , Rfl:     loratadine  (Claritin ) 10 MG tablet, Take 1 tablet (10 mg total) by mouth daily, Disp: 30 tablet, Rfl: 0    mupirocin (BACTROBAN) 2 % ointment, Apply topically every 8 (eight) hours (Patient not taking: Reported on 04/03/2023), Disp: , Rfl:     oxybutynin XL (DITROPAN-XL) 5 MG 24 hr tablet, Take 1 tablet (5 mg) by mouth daily, Disp: , Rfl:     pravastatin (PRAVACHOL) 20 MG tablet, Take 1 tablet (20 mg) by mouth nightly, Disp: , Rfl:     pregabalin  (Lyrica ) 200 MG capsule, TAKE ONE CAPSULE BY MOUTH THREE TIMES A DAY, Disp: 90 capsule, Rfl: 1    raloxifene  (EVISTA ) 60 MG tablet, Take 1 tablet (60 mg total) by mouth daily., Disp: 30 tablet, Rfl: 3    sodium chloride  (MURO 128) 5 % ophthalmic solution, 1 drop as needed, Disp: , Rfl:     vitamin D , ergocalciferol , (DRISDOL ) 50000 UNIT Cap, Take 1 capsule (50,000 Units) by mouth once a week, Disp: 12 capsule, Rfl: 3    FH:     Family History   Problem Relation Name Age of Onset    Myocardial Infarction Mother      Diverticulitis Sister      Diabetes Maternal Grandmother      Brain cancer Maternal Grandfather      Breast cancer Neg Hx         SH:     Social History     Socioeconomic History  Marital status: Single   Tobacco Use    Smoking status: Every Day     Current packs/day: 0.50     Average packs/day: 0.5 packs/day for 30.0 years (15.0 ttl pk-yrs)     Types: Cigarettes    Smokeless tobacco: Never    Tobacco comments:     5-6  cigarettes per day    Vaping Use    Vaping status: Never Used   Substance and Sexual Activity    Alcohol use: Yes     Comment: occasional, shots of corazon on special occasions    Drug use: Not Currently     Frequency: 2.0 times per week     Types: Marijuana     Comment: uses fro pain rellief  will hold use 02/28/15       ROS:   All other systems reviewed and negative except as described above.        PHYSICAL EXAM:     There were no vitals filed for this visit.      General appearance - alert, well appearing, and in no distress  Mental status - alert, oriented to person, place, and time  Eyes - extraocular eye movements intact  Back exam - + tenderness of para L-spine  Neurological - alert, oriented, normal speech, no focal findings or movement disorder noted  Extremities - peripheral pulses normal, no pedal edema, no clubbing or cyanosis  Skin - normal coloration and turgor, no rashes, no suspicious skin lesions noted  Musculoskeletal - No tenderness of MCPs and PIPs. Good range of motion of shoulders, elbows, wrists and small joints of hands. Good range of motion of hips, knees and ankles.  Knees crepitus with range of motion of knees without effusions. Ankles without effusions.  No tenderness of MTPs or effusions.   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             Labs:     Component      Latest Ref Rng & Units 03/18/2018   WBC      3.10 - 9.50 x10 3/uL 9.24   Hemoglobin      11.4 - 14.8 g/dL 87.7   Hematocrit      34.7 - 43.7 % 36.1   Platelet Count      142 - 346 x10 3/uL 206   RBC      3.90 - 5.10 x10 6/uL 4.09   MCV      78.0 - 96.0 fL 88.3   MCH      25.1 - 33.5 pg 29.8   MCHC      31.5 - 35.8 g/dL 66.1   RDW      11 - 15 % 19 (H)   MPV      8.9 - 12.5 fL 10.2   Neutrophils      None % 45.9   Lymphocytes Automated      None % 41.5   Monocytes      None % 9.6   Eosinophils Automated      None % 2.1   Basophils Automated      None % 0.6   Immature Granulocytes      None % 0.3   Nucleated RBC      0.0 - 0.0  /100 WBC 0.0   Neutrophils Absolute      1.10 - 6.33 x10 3/uL 4.24   Lymphocytes Absolute Automated      0.42 - 3.22 x10 3/uL 3.83 (H)  Monocytes Absolute Automated      0.21 - 0.85 x10 3/uL 0.89 (H)   Eosinophils Absolute Automated      0.00 - 0.44 x10 3/uL 0.19   Basophils Absolute Automated      0.00 - 0.08 x10 3/uL 0.06   Immature Granulocytes Absolute      0.00 - 0.07 x10 3/uL 0.03   Nucleated RBC Absolute      0.00 - 0.00 x10 3/uL 0.00   Glucose      70 - 100 mg/dL 93   BUN      7.0 - 80.9 mg/dL 89.9   Creatinine      0.6 - 1.0 mg/dL 0.8   Calcium      8.5 - 10.5 mg/dL 8.9   Sodium      863 - 145 mEq/L 141   Potassium      3.5 - 5.1 mEq/L 3.7   Chloride      100 - 111 mEq/L 106   CO2      22 - 29 mEq/L 22       Radiology:     DXA bone density axial skeleton [IMG572] (Order [id number])  Status: Final result     Study Result    Narrative & Impression   INDICATION: Osteoporosis screening.  Menopause     INTERPRETATION:  DEXA methodology was utilized to evaluate the bone  mineral density.  The measurements are as follows:     PRIOR: 04/25/2011     Left hip- total:  BMD:  0.604 g/cm2.  T-score:  -2.8  Z- score: -1.4  7.3% increase from baseline.     Left femoral neck:  BMD: 0.610 g/cm/2  T-score: -2.2  Z- score: -0.5     Spine (L1-L4):  BMD:  1.032 g/cm2.  T-score:  -0.1  Z- score: 1.7  24% increase from baseline. Please note that lumbar spine BMD values may  be falsely elevated due to sclerosis.        IMPRESSION:    Osteoporosis of the left hip. Osteopenia of the left femoral  neck.       ASSESSMENT:   Ann Patterson is a 73 y.o. female with Osteoporosis ( started Prolia  in 2020) chronic smoker, DJD of C-spine and L-spine, OA of Rt hip, Polio with left leg muscle atrophy.    She has Osteoporosis and had Prolia  ( in 03/2020, 03/2021, 09/2021, 03/2022, 10/2022, 03/2023).    She has left ischial bursitis and wants to have steroid injection.     PLAN:           Osteoporosis:     1. Order labs   2. Recommend  Prolia  60 mg SQ every 6 months on 04/02/2023, then in 6/25  3. Recommend total calcium (diet and supplement) 1200 mg daily  4. Recommend Vit D 1000 IU twice per day   5. Exercise      Recommend  as tolerated assuming activities can be performed            safely with low risk of fall and/or injury.  6. Fall Risk Reduction      Recommend self-guided home assessment for fall risk (elimination of clutter, cords, rugs, etc)      Recommend participation in piliates and/or t'ai chi as able  7. Stop smoking       Osteoarthritis/Joint pain     Recommend Tylenol  as needed       left ischial bursitis  Follow 6 months           Bursitis steroid injection    Consent: Written consent not obtained.  Risks and benefits: risks, benefits and alternatives were discussed  Consent given by: patient  Patient understanding: patient states understanding of the procedure being performed  Patient identity confirmed: verbally with patient  Indications: pain   Sterile: ChloraPrep  Body area: bursae of left ischial   Local anesthesia used: yes  Patient sedated: no  Preparation: patient was prepped and draped in the usual sterile fasion  Needle gauge: 23  Meds administered: kenalog 40 mg with 1 ml of lidocaine   Patient tolerance: patient tolerated the procedure well with no immediate complications.  Comments: no

## 2023-09-29 ENCOUNTER — Other Ambulatory Visit: Payer: Self-pay

## 2023-09-29 ENCOUNTER — Telehealth (INDEPENDENT_AMBULATORY_CARE_PROVIDER_SITE_OTHER): Payer: Self-pay

## 2023-09-29 NOTE — Telephone Encounter (Signed)
 Hello,    Just wanted to confirm if patient is still on Prolia ? We've made several attempts to the patient to obtain payment information but was unsuccessful. I see that the patient came in for an OV recently. Please advise

## 2023-10-02 ENCOUNTER — Other Ambulatory Visit: Payer: Self-pay

## 2023-10-02 NOTE — Progress Notes (Signed)
 Stable Chart Review and Refill Activity evaluation     I have evaluated data from the Refill Activity.? I have reviewed the patient's chart in EMR and confirmed medication remains effective and patient is not experiencing any concerning side effects. Patient remains stable and adherent to therapy.     Patient's progress towards goals of therapy will be reassessed at next telephonic assessment with patient.?      Medications monitored by Specialty Pharmacy: denosumab  (PROLIA ) 60 MG/ML Solution Prefilled Syringe subcutaneous injection - inject 60mg  into skin once every 6 months    Specialty Medication discussed this encounter: denosumab  (PROLIA ) 60 MG/ML Solution Prefilled Syringe subcutaneous injection - inject 60mg  into skin once every 6 months    Date of last MD visit: 09/27/2023  MD summary notes/laboratory data:   She started Prolia  in 2020 with her GYN Dr. Ian Cahill at [facility].   She had Prolia  60mg  injection on 04/13/2021, 09/2021, 03/2022, 10/2022, 03/2023 at [facility]. No side effect. She takes calcium 600 mg BID and Vit D 50,000 iu weekly. She does walking exercise 30 minutes and does yard work. She had no history of cancer, chemotherapy or radiation therapy.   History of fragility fracture of left wrist   Current smoker: tries to cut down smoking, less than half pack day, smoke for  50 years  No Alcohol usages drinks per day    DEXA 12/18/20  Femoral Neck (L)    -1.9  Lumbar Spine           0.0  Left hip                     -2.6    DEXA 07/19/19:  Left Hip                     -2.8  Femoral Neck (left)   -2.2  Spine                         -0.1    Clinically Relevant Labs:  CHEM-7  Lab Results   Component Value Date    CREAT 0.75 09/19/2023    BUN 9 09/19/2023    NA 141 09/19/2023    K 4.9 09/19/2023    CL 101 09/19/2023    CO2 25 09/19/2023    CA 9.3 09/19/2023    GLU 93 09/19/2023   CBC  Lab Results   Component Value Date    WBC 6.96 03/25/2021    HGB 14.3 03/25/2021    HCT 42.7 03/25/2021    MCV 93.0  03/25/2021    PLT 212 03/25/2021   Vitamin D   Lab Results   Component Value Date    VITD 105.0 (H) 09/19/2023      Refill Activity Evaluation   Adherence per dispensing fill data: Y  Any dose change/medication change/new allergy/new health condition reported??No Changes or Updates Reported    Next F/U RPh review include: updated dexa scan    Follow up:10 months  Chart review completed by:  Caven Perine, PharmD  Clinical Pharmacist  [facility] and Specialty Pharmacy Care Team

## 2023-10-04 ENCOUNTER — Ambulatory Visit (INDEPENDENT_AMBULATORY_CARE_PROVIDER_SITE_OTHER): Payer: Medicare Other | Admitting: Internal Medicine

## 2023-10-04 ENCOUNTER — Other Ambulatory Visit: Payer: Self-pay

## 2023-10-04 MED FILL — Denosumab Inj Soln Prefilled Syringe 60 MG/ML: SUBCUTANEOUS | 180 days supply | Qty: 1 | Fill #0 | Status: AC

## 2023-10-04 NOTE — Progress Notes (Signed)
 Specialty Pharmacy Refill Note    Ann Patterson is a 73 y.o. female, who is being followed by [facility] team for management of: RxSp Osteoporosis (Enrolled) for the following services:  Clinical Management  Refill Management  Benefits and PA Management    Medications monitored by Specialty Pharmacy:   denosumab  (PROLIA ) 60 MG/ML Solution Prefilled Syringe subcutaneous injection   INJECT 60 MG INTO THE SKIN ONCE FOR 1 DOSE EVERY 6 MONTHS      Refill Coordination  Medications Linked to Program: Denosumab  (PROLIA )  HIPAA verified (patient name & dob or patient name & address) with approved contact: Yes  How has _______ (specialty medication) helped you manage your _______ (condition) from very well to very poor: Well  Changes to allergies?: No  Changes to medications, herbals or supplements?: No  New conditions (diagnosis) since last pharmacy outreach: No  Since the last fill, has the patient experienced any unplanned office visit, urgent care, emergency room, or hospital admission: No  Since the last fill, any new or worsened side effects?: No  Financial problems or insurance changes : No  Since the last fill, has the patient missed any doses of their specialty medication : No  Doses left of specialty medications: 0  Does the patient have the correct number of remaining doses: Yes  Patient confirmations: received welcome packet, patient bill of rights, & privacy practices: Patient has received all  Patient confirmation: patient previously received the drug monograph(s) for their specialty medication(s): Yes    Delivery Information  Delivery confirmation: signature required for delivery: Yes  Delivery method: Deliver to Clinic (Comment)  Delivery confirmation: delivery address: Delivery Address Reviewed & Accurate  Enter delivery address: [address], [place] [address]  Delivery phone number: [phone]  Delivery confirmation: patient informed of and confirms delivery  date. Enter Delivery date: : 10/06/23 (Exception Date 10/09/23)  Preferred time?: AM  Number of medications in delivery: 1  Is there any medication that is due not being filled?: No  Supplies needed?: No supplies needed  Does patient have any concerns about safely storing medications (at the correct temperature, away from children/pets,etc.)?: No  Do any medications need mixed or dated?: No  Financial confirmation: patient informed of financial responsibility and copay amount: Yes  Financial responsibility: copay amount: 12.15  Copay form of payment: Credit card on file  Questions or concerns for the pharmacist?: No  Are any medications first time fills?: No        Medicare Part B Fill? No    [name]

## 2023-10-05 ENCOUNTER — Other Ambulatory Visit: Payer: Self-pay

## 2023-10-06 ENCOUNTER — Telehealth (INDEPENDENT_AMBULATORY_CARE_PROVIDER_SITE_OTHER): Payer: Self-pay

## 2023-10-06 NOTE — Telephone Encounter (Signed)
 Prolia  injection arrived to office on 10/06/23. Injection was stored in refrigerator.

## 2023-10-12 ENCOUNTER — Encounter (INDEPENDENT_AMBULATORY_CARE_PROVIDER_SITE_OTHER): Payer: Self-pay

## 2023-10-12 NOTE — Telephone Encounter (Signed)
 Called both phone numbers on file in Pt's chart and cell's VM is full and home phone is not in service.  MyChart message sent to Pt to set up Prolia  nurse visit appointment. Waiting for Pt to call back.

## 2023-10-12 NOTE — Telephone Encounter (Signed)
 Pt is due for Prolia  injection 6/25

## 2023-10-21 ENCOUNTER — Other Ambulatory Visit: Payer: Self-pay | Admitting: Neurology

## 2023-10-21 DIAGNOSIS — M542 Cervicalgia: Secondary | ICD-10-CM

## 2023-10-21 DIAGNOSIS — M5412 Radiculopathy, cervical region: Secondary | ICD-10-CM

## 2023-10-21 MED ORDER — PREGABALIN 200 MG PO CAPS
200.0000 mg | ORAL_CAPSULE | Freq: Three times a day (TID) | ORAL | 0 refills | Status: DC
Start: 2023-10-21 — End: 2023-11-13

## 2023-10-22 ENCOUNTER — Other Ambulatory Visit: Payer: Self-pay | Admitting: Neurology

## 2023-10-22 DIAGNOSIS — M5412 Radiculopathy, cervical region: Secondary | ICD-10-CM

## 2023-10-22 DIAGNOSIS — M542 Cervicalgia: Secondary | ICD-10-CM

## 2023-10-22 NOTE — Progress Notes (Signed)
 Spoke with pharmacy:  Patient preference of brand name requires PA  Generic is available and approved and patient will try it, but prefers brand name  Will let Dr Sumner know that the PA is needed.     Rolan Rubins MD, JD, [facility]

## 2023-11-11 ENCOUNTER — Other Ambulatory Visit: Payer: Self-pay | Admitting: Neurology

## 2023-11-11 DIAGNOSIS — M542 Cervicalgia: Secondary | ICD-10-CM

## 2023-11-11 DIAGNOSIS — M5412 Radiculopathy, cervical region: Secondary | ICD-10-CM

## 2023-11-29 ENCOUNTER — Emergency Department

## 2023-11-29 ENCOUNTER — Emergency Department
Admission: EM | Admit: 2023-11-29 | Discharge: 2023-11-29 | Disposition: A | Discharge status: Home / Self Care | Attending: Student in an Organized Health Care Education/Training Program | Admitting: Student in an Organized Health Care Education/Training Program

## 2023-11-29 DIAGNOSIS — M542 Cervicalgia: Secondary | ICD-10-CM

## 2023-11-29 DIAGNOSIS — R079 Chest pain, unspecified: Secondary | ICD-10-CM

## 2023-11-29 DIAGNOSIS — Z008 Encounter for other general examination: Secondary | ICD-10-CM

## 2023-11-29 DIAGNOSIS — F4321 Adjustment disorder with depressed mood: Secondary | ICD-10-CM | POA: Insufficient documentation

## 2023-11-29 DIAGNOSIS — F17210 Nicotine dependence, cigarettes, uncomplicated: Secondary | ICD-10-CM | POA: Insufficient documentation

## 2023-11-29 DIAGNOSIS — F32A Depression, unspecified: Secondary | ICD-10-CM | POA: Insufficient documentation

## 2023-11-29 DIAGNOSIS — F419 Anxiety disorder, unspecified: Secondary | ICD-10-CM | POA: Insufficient documentation

## 2023-11-29 DIAGNOSIS — M5412 Radiculopathy, cervical region: Secondary | ICD-10-CM | POA: Insufficient documentation

## 2023-11-29 DIAGNOSIS — Z634 Disappearance and death of family member: Secondary | ICD-10-CM | POA: Insufficient documentation

## 2023-11-29 LAB — URINE DRUGS OF ABUSE SCREEN
Urine Amphetamine Screen: NEGATIVE
Urine Barbituate Screen: NEGATIVE
Urine Benzodiazepine Screen: NEGATIVE
Urine Cannabinoid Screen: POSITIVE — AB
Urine Cocaine Screen: NEGATIVE
Urine Fentanyl Screen: NEGATIVE
Urine Opiate Screen: NEGATIVE
Urine PCP Screen: NEGATIVE

## 2023-11-29 LAB — URINALYSIS WITH REFLEX TO MICROSCOPIC EXAM - REFLEX TO CULTURE
Urine Bilirubin: NEGATIVE
Urine Blood: NEGATIVE
Urine Glucose: NEGATIVE
Urine Leukocyte Esterase: NEGATIVE
Urine Nitrite: NEGATIVE
Urine Specific Gravity: 1.03 (ref 1.001–1.035)
Urine Urobilinogen: 2 mg/dL (ref 0.2–2.0)
Urine pH: 6 (ref 5.0–8.0)

## 2023-11-29 LAB — LAB USE ONLY - CBC WITH DIFFERENTIAL
Absolute Basophils: 0.05 x10 3/uL (ref 0.00–0.08)
Absolute Eosinophils: 0.01 x10 3/uL (ref 0.00–0.44)
Absolute Immature Granulocytes: 0.05 x10 3/uL (ref 0.00–0.07)
Absolute Lymphocytes: 2.39 x10 3/uL (ref 0.42–3.22)
Absolute Monocytes: 0.81 x10 3/uL (ref 0.21–0.85)
Absolute Neutrophils: 6.36 x10 3/uL — ABNORMAL HIGH (ref 1.10–6.33)
Absolute nRBC: 0 x10 3/uL (ref <=0.00)
Basophils %: 0.5 %
Eosinophils %: 0.1 %
Hematocrit: 43.6 % (ref 34.7–43.7)
Hemoglobin: 15.2 g/dL — ABNORMAL HIGH (ref 11.4–14.8)
Immature Granulocytes %: 0.5 %
Lymphocytes %: 24.7 %
MCH: 31.9 pg (ref 25.1–33.5)
MCHC: 34.9 g/dL (ref 31.5–35.8)
MCV: 91.6 fL (ref 78.0–96.0)
MPV: 9.8 fL (ref 8.9–12.5)
Monocytes %: 8.4 %
Neutrophils %: 65.8 %
Platelet Count: 250 x10 3/uL (ref 142–346)
Preliminary Absolute Neutrophil Count: 6.36 x10 3/uL — ABNORMAL HIGH (ref 1.10–6.33)
RBC: 4.76 x10 6/uL (ref 3.90–5.10)
RDW: 15 % (ref 11–15)
WBC: 9.67 x10 3/uL — ABNORMAL HIGH (ref 3.10–9.50)
nRBC %: 0 /100{WBCs} (ref <=0.0)

## 2023-11-29 LAB — ECG 12-LEAD
Atrial Rate: 60 {beats}/min
Atrial Rate: 66 {beats}/min
IHS MUSE NARRATIVE AND IMPRESSION: NORMAL
IHS MUSE NARRATIVE AND IMPRESSION: NORMAL
P Axis: 25 degrees
P Axis: 57 degrees
P-R Interval: 114 ms
P-R Interval: 120 ms
Q-T Interval: 450 ms
Q-T Interval: 476 ms
QRS Duration: 66 ms
QRS Duration: 74 ms
QTC Calculation (Bezet): 471 ms
QTC Calculation (Bezet): 476 ms
R Axis: 12 degrees
R Axis: 12 degrees
T Axis: 67 degrees
T Axis: 73 degrees
Ventricular Rate: 60 {beats}/min
Ventricular Rate: 66 {beats}/min

## 2023-11-29 LAB — COMPREHENSIVE METABOLIC PANEL
ALT: 7 U/L (ref <=55)
AST (SGOT): 39 U/L (ref <=41)
Albumin/Globulin Ratio: 0.9 (ref 0.9–2.2)
Albumin: 3.6 g/dL (ref 3.5–5.0)
Alkaline Phosphatase: 53 U/L (ref 37–117)
Anion Gap: 18 — ABNORMAL HIGH (ref 5.0–15.0)
BUN: 13 mg/dL (ref 7–21)
Bilirubin, Total: 1.2 mg/dL (ref 0.2–1.2)
CO2: 19 meq/L (ref 17–29)
Calcium: 8.9 mg/dL (ref 7.9–10.2)
Chloride: 102 meq/L (ref 99–111)
Creatinine: 0.6 mg/dL (ref 0.4–1.0)
GFR: >60 mL/min/1.73 m2 (ref >=60.0)
Globulin: 4 g/dL — ABNORMAL HIGH (ref 2.0–3.6)
Glucose: 74 mg/dL (ref 70–100)
Potassium: 3.5 meq/L (ref 3.5–5.3)
Protein, Total: 7.6 g/dL (ref 6.0–8.3)
Sodium: 139 meq/L (ref 135–145)

## 2023-11-29 LAB — ETHANOL (ALCOHOL) LEVEL: Alcohol: NOT DETECTED

## 2023-11-29 LAB — HIGH SENSITIVITY TROPONIN-I: hs Troponin: <2.7 ng/L (ref <=14.0)

## 2023-11-29 LAB — ACETAMINOPHEN LEVEL: Acetaminophen Level: <7 ug/mL — ABNORMAL LOW (ref 10–30)

## 2023-11-29 LAB — SALICYLATE LEVEL: Salicylate: <5 mg/dL — ABNORMAL LOW (ref 15.0–30.0)

## 2023-11-29 MED ORDER — SODIUM CHLORIDE 0.9 % IV BOLUS
1000.0000 mL | Freq: Once | INTRAVENOUS | Status: AC
Start: 2023-11-29 — End: 2023-11-29
  Administered 2023-11-29: 1000 mL via INTRAVENOUS

## 2023-11-29 MED ORDER — PREGABALIN 200 MG PO CAPS
200.0000 mg | ORAL_CAPSULE | Freq: Three times a day (TID) | ORAL | 4 refills | Status: AC
Start: 2023-11-29 — End: ?

## 2023-11-29 NOTE — ED Triage Notes (Addendum)
 Pt w/ aunt at bedside here for R sided chest pain, dizziness, dehydration, depressed state, expiratory wheezing sound, forgetting things infrequently. Pt reports cheat pain started today. Pts aunt requesting a psych eval for pt d/t declining and unstable state x 4 m since sons death. Pts aunt has reports pt has locked herself in hotel room x 1 wk, stopped taking all medication, and upset that she was found in hotel room. Pt a/o x 4, very upset, reports being more exaggerated that normal, GCS 15, airway intact, speaking in clear and full sentences.

## 2023-11-29 NOTE — Consults (Signed)
 [facility] Access Consult  Consult performed by: Renne Purchase, MD  Consult ordered by: Renne Purchase, MD  Reason for consult: Psychiatric Evaluation- grief    .[facility] Access Initial Consult    Ann Patterson is a 73 y.o. female admitted to the [facility] Emergency Department who was seen via telemedicine with their consent on 11/29/2023 by Leonardo Addison, LPC.    Call Details  Patient Location: [facility] ED  Patient Room Number: 110  Date contacted by ED Physician: 11/29/23  Time contacted by ED Physician: 1418  Date consult began: 11/29/23  Time consult began: 1708  Date consult concluded: 11/29/23  Time consult concluded: 1810  Time (in minutes) from Contact to Consult: 170  Time (in minutes) from Consult Start to Consult End: 62  Referring ED Department  Emergency Department: [facility]  --  [place] Suicide Severity Rating Scale (Short Version)  1. In the past month - Have you wished you were dead or wished you could go to sleep and not wake up?: No  2.  In the past month - Have you actually had any thoughts of killing yourself?: No  6. Have you ever done anything, started to do anything, or prepared to do anything to end your life: No  CSSRS Risk Level : No risk    Specific Questioning About Thoughts, Plans, and Suicidal Intent (SAFE-T)  How many times have you had these thoughts? (Past Month): Does not apply  When you have the thoughts how long do they last? (Past Month): Does not apply  Could/can you stop thinking about killing yourself or wanting to die if you want to? (Past Month):  (N/A)  Are there things - anyone or anything (e.g. family, religion, pain of death) - that stopped you from wanting to die or acting on thoughts of suicide? (Past Month): Does not apply  What sort of reasons did you have for thinking about wanting to die or killing yourself?  Was it to end the pain or stop the way you were feeling (in other words you couldn't go on living with this  pain or how you were feeling) or was it to get attention:  (N/A)    Step 2: Identify Risk Factors  Clinical Status (Current/Recent): Hopelessness or despair  Clinical Status (Lifetime): Patient denies  Precipitants/Stressors: Triggering events leading to humiliation, shame, and/or despair (e.g. Loss of relationship, financial or health status) (real or anticipated), Current or pending isolation or feeling alone  Treatment History / Dx: Patient denies  Access to lethal methods: No, does not have access to lethal methods    Step 3: Identify Protective Factors  Internal: Fear of death or the actual act of killing self, Identifies reasons for living  External: Responsibility to family or others, living with family, Supportive social networks or family  Other protective factors: N/A    Step 4: Guidelines to Determine Level of Risk  Suicide Risk Level: Low    Step 5: Possible Interventions to LOWER Risk Level  Behavioral Health Ambulatory, or Emergency Department: Mental heatlh referral at discharge  Behavioral Health Inpatient Unit:  (N/A)    Step 6: Documentation  Summary of Evaluation: Patient was evaluated and denied any current or recent suicidal ideation, intent, or plan. The patient also denied experiencing any self-injurious urges or behaviors. Throughout the evaluation, the patient was alert, oriented, and able to engage appropriately. No immediate safety concerns were identified at this time.      Have  you considered [facility] for the patient? yes  If yes, was the patient admitted to [facility]? no  If no, why not: patient declined.    Presenting Problem: I'm sad. I lost my second town boy in march.    Current major stressors: Recent Death of Her Son, Previous Loss of First Twin Son, Lack of Emotional Support.    Psychiatric History  Inpatient treatment history: none reported  Outpatient treatment history (current & past): none reported  Diagnoses (past): none reported  Prior medication trials: none reported  Suicide  Attempts/self- Injurious behaviors: patient denies  Trauma History: Recent Death of Her Son, Previous Loss of First Twin Son  Hx of violence/aggression:     Violence Toward Others  Within the Last 6 Months:: no history of violence toward others  Greater than 6 Months Ago:: no history of violence toward others    Violence Toward Self  Within the Last 6 Months:: no history of violence toward self  Greater than 6 Months Ago:: no history of violence toward self    Medical History (include active and chronic medical conditions)  Medical History[1]     Does patient have or needs one of the following:(please answer yes or no)  - Insulin Pump: no  - Oxygen: no  - Wound care: no  - PICC Line: no  - Catheter: no  - Implanted Devices: no  - Can they walk without assistance: yes  - Can they do their ADLs: yes    Substance Use History  Alcohol /Drug Use History  Alcohol  use within the past 12 months?: No  Alcohol  use greater than 12 months ago?: No  Drug use within the past 12 months?: No  Drug use greater than 12 months ago?: No    Substance Abuse History  Previous Substance Abuse Treatment?: No  Recovery and Support Involvement  Current Involvement: None  Past Involvement: None  Sponsor (Yes/No): n/a  Have you been prescribed medications to support recovery?: None  Recovery Resources/Support: N/A  Legal Guardian/Parent: N/A  Support Systems: Family members  Substance Recovery Support  Have you been prescribed medications to support recovery?: None  Recovery Resources/Support: N/A  Transportation Available: N/A  Past Withdrawal Symptoms  Past Withdrawal Symptoms: None  History of Blackouts?: No  History of Withdrawal Seizures?: No    Home Medications (names, doses, frequency, psychiatric, non psychiatric, adherent/nonadherent): .Current Medications[2]     Social History:   Living Arrangements: Family members  Type of Residence: Private residence  Support Systems: Family members  Employment status/occupation: none reported  Legal  history: none reported    Family History (psych dx, substance use, suicide attempts): none reported    Presenting Mental Status  Orientation Level: Oriented x 4  Consciousness: Alert  Memory: No Impairment  Thought Content: Within Normal Limits  Thought Process: Linear and goal-directed  Perception: No perceptual disturbances  Mood: Depressed  Affect: Full Range  Attitude: Cooperative  Behavior: Hyperactive  Speech: Hyperverbal  Eye Contact: Within Normal Limits  Appearance: Appropriatly dressed  Insight: Partial  Judgment: Good  Impulse Control: Intact  Concentration: Intact  Sleep: Within Normal Limits  Energy: Decreased  Appetite: Decreased  Weight change?: Loss  Weight Loss (Pounds): unknown  Period of Time: unknown  Reliability of Reporter/Patient: Fair    Summary: Patient is a 73 year old Female with unknown PMHx BIB her aunt to the ED with complaints of Right sided chest pain, dizziness, dehydration, depressed state, expiratory wheezing sound, forgetting things infrequently. Per report from ED provider, p/w  recent bereavement. She is experiencing significant emotional distress following the recent death of her son, who passed away at the age of 4 a few months ago. She feels that she does not want to live anymore but denies any active suicidal ideation or plans. Her son passed away on the same day she was forced to move out due to the sale of the house she was renting, which compounded her grief. Her other son, a twin, also passed away at the same hospital previously over 20 years ago. She is currently homeless and staying in hotels paid for by her sister. She has not eaten in several days, possibly up to a week, and cannot recall her last meal. She smokes cigarettes heavily and uses marijuana when available, for which she has a medicinal marijuana card. 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 She acknowledges experiencing symptoms consistent with anxiety, depression, or PTSD but has not been  formally diagnosed. She has a psychiatrist but does not mention any specific medications for mental health issues.    Writer completed this evaluation using tele-medicine [employer]. Patient was observed resting in the ED stretcher calm and cooperative. This Clinical research associate came on video and introduced himself Patient appeared oriented 4x with logical thought process. Patient's speech was normal and coherent. Patient tells Clinical research associate that she came into the ED today because she was feeling unwell, emotionally overwhelmed, and depressed. She recently lost her second twin son on March 18th, and her first twin passed away 10 years ago on 04/01/2025just before his 14th birthday. These losses have had a severe emotional impact on her. she reports deep sadness, loneliness, loss of appetite, poor sleep, and a general sense of emotional numbness. She says she would continue crying if she could, but feels she has no tears left.    She also experiences motion sickness and a feeling that the room is spinning, which she's had since childhood. Though she has spoken with her neurologist and primary care doctor who prescribed medications, she has not seen a psychiatrist. When asked, she expressed skepticism about what a psychiatrist could do for her, but the interviewer gently explained that a therapist and psychiatrist could help her process her grief and monitor how it's affecting her daily life. Ultimately, Ms. Elizondo acknowledged that her emotional distress brought her to the hospital and agreed she was seeking medical and possibly psychiatric help due to her depression, grief, and isolation.    Collateral from Patient's Aunt Romero Mcdonald)-Miss [name] states that patient appears to be struggling with severe depression and instability. Miss [name] explains that Miss Sanzo has been moving from hotel to hotel, forgetting things, not eating, and likely not taking her medication. She recently disappeared and was later found disoriented in a  hotel, not knowing how she got there or how she would pay for her stay. Miss [name] and other elderly family members are overwhelmed and worried for her safety, citing past concerning statements from Miss Blackard about wanting to harm herself a few weeks ago. Miss [name] finally convinced her to come to the hospital voluntarily for a psychiatric evaluation. She expresses deep concern, love, and frustration, feeling out of options to help Miss Niehaus stabilize her life.    The Writer began by [employer], then redirected the conversation to [employer]. They acknowledged that Miss Labella had heard everything [name] shared and wanted to address one specific point: [name] had mentioned that Miss Engdahl made a comment a few weeks  ago about using a gun to harm herself. The Writer asked Miss Adkison what she had meant by that. Miss Varas responded by clarifying that her intent had been misunderstood. She explained that she had said she wanted to shoot herself in the foot--not to end her life, but as a desperate attempt to get the help she needed. The Writer expressed confusion and asked how injuring herself would result in more assistance than what she was currently receiving.    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 The Writer paused to ensure they were understanding correctly and reiterated that the comment was made out of desperation. They asked Miss Kham what kind of help she had hoped for at the time.\ Miss Dutan admitted that she hadn't had a specific expectation and was mentally unwell in that moment. She described it as a time when she had lost all touch with reality and shared that she had only recently begun to feel mentally clear again.    The Writer then asked if, at that moment, she had felt like she wanted to die or physically harm  herself. Miss Hutto firmly replied no, stating that she loved life. 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 The Writer gently reminded her to lower her voice, assuring her that she could be heard clearly.  Miss Pulver apologized, saying she didn't intend to yell and sometimes forgot how loud she was. She asked for forgiveness, and the Writer reassured her that it was okay. Miss Dileo then said she was struggling and needed support. The Writer encouraged her, telling her she was doing well, and again asked her to speak more softly--not for their sake, but to help her stay calm. Miss Stipes agreed and said she would try.    The Writer then returned to Miss Anna's concerns, stating that [name] had every reason to be worried. They pointed out that Miss Kosak had been leaving the house without notifying anyone, leaving [name] to search for her. Miss Marcon acknowledged this, saying it wasn't the first time she had wandered off. She mentioned having traveled to places like [place] , [place] , and [place]  simply because she felt the urge to go. When asked why she didn't inform anyone of her whereabouts, Miss Azizi said [name] always found out eventually and admitted she might have been trying to hide from her. The Writer asked why she would want to hide from her family. Miss Mclees explained that she didn't want to bring them down with her sadness. She said her family often felt sorry for her, and she didn't want that anymore--she wanted them to know she would be okay.    The Writer then asked if she would be willing to see a psychiatrist. Miss Thalman replied that she already had one, but the Writer clarified that she had a neurologist, not a psychiatrist. They explained that a psychiatrist specializes in mental health and would be able to evaluate her emotional and psychological needs. Miss Heacock said she would agree to see one, as long as she wouldn't  be harmed. The Writer assured her that she would be safe.  They asked if she would be open to taking medication if recommended by the psychiatrist. Kineta Fudala said she already took 200 mg  of medication three times a day and questioned how much more would be needed. The Writer explained that the psychiatrist would assess her needs and determine whether any adjustments were necessary, emphasizing the importance of remaining open-minded.    Miss Sebring expressed frustration, saying she had taken too many medications in her life and didn't want to be prescribed more. However, she added that if medication was necessary, she would comply. The Writer acknowledged her openness and validated how exhausting it must be to manage so many medications. They made it clear that no prescriptions were being made at that moment but that the first step was a referral to a psychiatrist at a nearby clinic.  The Writer explained that Miss [name] would need to accompany Miss Ivie to the clinic the next morning, as the clinic accepted walk-ins. A note would be placed in the system on Miss Tayag's behalf. Upon discharge, Miss Economos would receive paperwork, and the Writer directed her to look for the section titled Mental Health Resources, which would be highlighted in bold. That section would contain the clinic's address, contact information, and further instructions.    The Writer also advised Miss [name] that when they arrived at the clinic, she should inform staff that Miss Sigman had been seen at [facility] and was referred to a psychiatrist. The clinic would have the Writer's note and would likely ask some follow-up questions. They emphasized that Miss Anna's role as a collateral source was critical her presence could provide much-needed context and clarity. Without that, the clinic staff might not fully grasp the situation, potentially impacting treatment recommendations, including medication. Both patient and her Wylie [name] acknowledged this disposition an agreed to report to [facility] tomorrow.     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 Patient denies any current suicidal ideations, homicidal ideations, and hallucinations. patient can return home and follow up with the recommended outpatient care. Patient has been provided with appropriate resources, including outpatient mental health follow-up, and is aware of how to access emergency services if needed. The patient has agreed to follow-up with outpatient mental health provider that was provided by this Clinical research associate. The patient has been given contact information for crisis hotlines and instructed to seek immediate help if necessary. Patient expressed understanding and will be discharged from the emergency department      Leonardo Addison, [place], [facility]    [facility]  [address]   [address]  [place], [place] [address]          [1]   Past Medical History:  Diagnosis Date    Headache     Hyperlipidemia     Low back pain     Meningitis spinal 1956    spinal and regular    Neuropathy     upper c spine issues, + carpal tunnel symptoms bilaterally    Pain     Pneumonia 2015    Polio  Polio 1956    Vertigo    [2] No current facility-administered medications for this encounter.    Current Outpatient Medications:     pravastatin (PRAVACHOL) 20 MG tablet, Take 1 tablet (20 mg) by mouth once at bedtime, Disp: , Rfl:     pregabalin  (LYRICA ) 200 MG  capsule, TAKE ONE CAPSULE BY MOUTH THREE TIMES A DAY, Disp: 90 capsule, Rfl: 4    betamethasone  dipropionate (DEL-BETA) 0.05 % cream, Apply topically 2 (two) times daily (Patient not taking: Reported on 04/03/2023), Disp: , Rfl:     ciclopirox (PENLAC) 8 % solution, APPLY TO AFFECTED NAILS NIGHTLY UNTIL CONDITION IMPROVES AS DIRECTED, Disp: , Rfl:     clindamycin  (CLEOCIN ) 300 MG capsule, Take 1 capsule (300 mg) by mouth 3 (three) times daily (Patient not taking: Reported on 04/03/2023), Disp: , Rfl:     denosumab  (PROLIA ) 60 MG/ML Solution Prefilled Syringe subcutaneous injection, INJECT 60 MG INTO THE SKIN ONCE FOR 1 DOSE EVERY 6 MONTHS, Disp: 1 mL, Rfl: 1    fluticasone (FLONASE) 50 MCG/ACT nasal spray, APPLY ONE SPRAY IN EACH NOSTRIL EVERY DAY, Disp: , Rfl:     loratadine  (Claritin ) 10 MG tablet, Take 1 tablet (10 mg total) by mouth daily, Disp: 30 tablet, Rfl: 0    mupirocin (BACTROBAN) 2 % ointment, Apply topically every 8 (eight) hours (Patient not taking: Reported on 04/03/2023), Disp: , Rfl:     oxybutynin XL (DITROPAN-XL) 5 MG 24 hr tablet, Take 1 tablet (5 mg) by mouth daily, Disp: , Rfl:     raloxifene  (EVISTA ) 60 MG tablet, Take 1 tablet (60 mg total) by mouth daily., Disp: 30 tablet, Rfl: 3    sodium chloride  (MURO 128) 5 % ophthalmic solution, 1 drop as needed, Disp: , Rfl:     vitamin D , ergocalciferol , (DRISDOL ) 50000 UNIT Cap, Take 1 capsule (50,000 Units) by mouth once a week, Disp: 12 capsule, Rfl: 3

## 2023-11-29 NOTE — ED Provider Notes (Signed)
 [facility] EMERGENCY DEPARTMENT  ATTENDING PHYSICIAN HISTORY AND PHYSICAL EXAM     Patient Name: Ann Patterson, Ann Patterson  Encounter Date:  11/29/2023  Attending Physician: Reyes Grow, MD  Room:  110/110  Patient DOB:  09/15/1950  Age: 73 y.o. female  MRN:  [id number]  PCP: Ann Jeryl PENNER, MD     CC: Dizziness, Nausea, and Emesis    HPI:   Ann Patterson is a 73 year old female who p/w recent bereavement. She is experiencing significant emotional distress following the recent death of her son, who passed away at the age of 62 a few months ago. She feels that she does not want to live anymore but denies any active suicidal ideation or plans. Her son passed away on the same day she was forced to move out due to the sale of the house she was renting, which compounded her grief. Her other son, a twin, also passed away at the same hospital previously over 20 years ago. She is currently homeless and staying in hotels paid for by her sister. Though she has passive SI, she reports she would never act upon these due to her sister and aunt who is at bedside. She has had decreased PO last few days. She smokes cigarettes heavily and uses marijuana when available, for which she has a medicinal marijuana card. 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 She acknowledges experiencing symptoms consistent with anxiety, depression, or PTSD but has not been formally diagnosed. She has a psychiatrist but does not mention any specific medications for mental health issues.     Vitals:   Vitals:    11/29/23 1330 11/29/23 1345 11/29/23 1445 11/29/23 1530   BP: 131/79 133/80 116/72 100/55   Pulse: 63 64 (!) 58 64   Resp: 18 17 16 19    Temp: 98 F (36.7 C)      TempSrc: Oral      SpO2: 100% 98% 98% 97%   Weight: 45.4 kg      Height: 5' 1 (1.549 m)          Physical Exam:   GENERAL: Alert, cooperative, well developed, no acute distress.  HEENT: Atraumatic, normocephalic, conjunctiva anicteric.  NECK: No obvious masses.  CV:  Regular rate, rhythm.  RESPIRATORY: Non-labored. Equal chest rise.  ABDOMEN: Non-distended. Non-tender.  MUSCULOSKELETAL: No gross extremity deformities. No swelling. Right leg full range of motion, left leg limited due to polio.  SKIN: No lesions. No rashes.  NEUROLOGICAL: Awake. Alert. Follows commands. Moves all extremities spontaneously. Mental status is at baseline.  PSYCH: passive SI, no AVH    ED Medications Given:   Medications   sodium chloride  0.9 % bolus 1,000 mL (1,000 mLs Intravenous New Bag 11/29/23 1513)        ED Lab Results:  Labs Reviewed   COMPREHENSIVE METABOLIC PANEL - Abnormal; Notable for the following components:       Result Value    Anion Gap 18.0 (*)     Globulin 4.0 (*)     All other components within normal limits   LAB USE ONLY - CBC WITH DIFFERENTIAL - Abnormal; Notable for the following components:    WBC 9.67 (*)     Hemoglobin 15.2 (*)     Preliminary Absolute Neutrophil Count 6.36 (*)     Absolute Neutrophils 6.36 (*)     All other components within normal limits   ACETAMINOPHEN  LEVEL - Abnormal; Notable for the following components:  Acetaminophen  Level <7 (*)     All other components within normal limits   SALICYLATE LEVEL - Abnormal; Notable for the following components:    Salicylate <5.0 (*)     All other components within normal limits   HIGH SENSITIVITY TROPONIN-I - Normal   CBC AND DIFFERENTIAL    Narrative:     The following orders were created for panel order CBC with Differential (Order).  Procedure                               Abnormality         Status                     ---------                               -----------         ------                     CBC with Differential (...[[id number]]  Abnormal            Final result                 Please view results for these tests on the individual orders.   ETHANOL (ALCOHOL ) LEVEL   URINALYSIS WITH REFLEX TO MICROSCOPIC EXAM - REFLEX TO CULTURE   LAB USE ONLY - URINE GRAY CULTURE HOLD TUBE   URINE DRUGS OF ABUSE  SCREEN       ED Imaging Results:  CT Head WO Contrast   Final Result       1.No acute intracranial hemorrhage or mass effect identified.   2.Mild cerebral volume loss and mildly extensive chronic small vessel   ischemic change in periventricular white matter.      [name], MD   11/29/2023 4:06 PM      Chest AP Portable   Final Result         1. No acute abnormality.   2. Scoliosis.      [name], MD   11/29/2023 2:20 PM           Procedures:  Procedures    ED Course as of 11/29/23 1610   Wed Nov 29, 2023   1436 Poor waveform on initial EKG.  Repeat EKG: I have reviewed and independently interpreted the patient's EKG as: Normal sinus rhythm at 60. Left axis. Normal PR, QRS, QTc interval. No ST changes.   [JA]      ED Course User Index  [JA] Renne Purchase, MD         Medical Decision Making:  [phone] F p/w anxiety, and possibly depression, She has passive but no active SI. There clinically does not appear to be evidence of co-ingestion/emergent toxidrome, trauma, or active withdrawal. The patient was observed in the ED with frequent monitoring and reassessment. Labs were obtained for medical screening, CTH reviewed was negative. She has depressive symptoms in the context of recent bereavement and her social situation. She has some protective factors of her living family and has a good support system. Currently pending [facility] consultation and final disposition, care transitioned to incoming physician.     The patient was handed off to the next ED provider at the completion of my shift. We discussed all aspects of the work-up that had been completed,  any pending studies/therapies, and all clinical decision making at the time care was handed off.     The patient's past medical records, including those in Care Everywhere when necessary, were reviewed by me.    Medical Decision Making  Amount and/or Complexity of Data Reviewed  Labs: ordered.  Radiology: ordered.  ECG/medicine tests: ordered.       Diagnosis:   Depression    Disposition:      I am the primary attending physician of record for this patient.      Renne Purchase, MD  11/30/23 [phone]

## 2023-11-29 NOTE — Discharge Instructions (Addendum)
 MENTAL HEALTH RESOURCES  [facility]): [address], [place], [place] [address]   Phone: [phone] Walk-In: IPAC Office  The [facility] office is open Mon-Fri from 8 a.m. to 4 p.m. The [facility] office is closed for all major holidays. We also close early at 3 p.m. the first Wednesday of every month, with the last registration accepted at 2 p.m.   Strictly walk-ins No appointments taken.   IPAC is for urgent psychiatric assessments. Due to the high volume of patients, individuals who will be arriving after 3 p.m. are strongly encouraged to call ahead to determine wait times and availability for that day. IPAC Providers DO NOT prescribe controlled substances such as stimulants (e.g., Adderall and Ritalin), benzodiazepines (e.g., Xanax and Ativan), or opiates (e.g., Percocet, OxyContin ).  We do not participate with [employer].  We recommend that you check your benefits coverage with your insurance company prior to coming to our facility.           [facility]:  Address: [address],   [place], [place] [address]  P: [phone]    For non-emergency CSB services:   Call our Entry & Referral Services during normal business hours (Monday through Friday, 9 a.m. to 5 p.m.) at [phone], TTY [phone]. Staff can take calls in [place] and [place] and can access interpreters for other languages when needed.    Youth and adults can also come in person, without prior appointment, to Entry & Referral Services at the [facility] Monday through Friday, 9 a.m. to 5 p.m. to be screened for services. Youth walk-in evaluations are offered during these times and also until 7 p.m. on Tuesdays.   Not sure you need us ? Take one of our quick, confidential online screenings, which are also available in [place].  [facility] WALK-IN ASSESSMENTS   2nd floor: (available in Spanish)  Walk-in hours are Monday through Friday, from 9 a.m. to 5 p.m. Youth walk-in  evaluations are offered during these times and also until 7 p.m. on Tuesdays.   [employer] ([address] and [address]), and [employer] bus ([address] and [address])

## 2023-11-29 NOTE — ED Provider Notes (Signed)
 [facility] EMERGENCY DEPARTMENT  BEHAVIORAL HEALTH SUBSEQUENT CARE NOTE     Patient Name: Ann Patterson, Ann Patterson  Department:[facility] EMERGENCY DEPT  Initial Encounter Date:  11/29/2023  Today's Date:  11/29/23                                                              Clinical Course:      Patient received in bedside sign out from Dr. Renne Purchase, MD at approximately 6:08 PM      Clinical Course:    ED Course as of 11/29/23 1808   Wed Nov 29, 2023   1436 Poor waveform on initial EKG.  Repeat EKG: I have reviewed and independently interpreted the patient's EKG as: Normal sinus rhythm at 60. Left axis. Normal PR, QRS, QTc interval. No ST changes.   [JA]      ED Course User Index  [JA] Renne Purchase, MD       Final Impression:   Final diagnoses:   None                                                               Orders Placed During This Visit:     Encounter Orders:  Orders Placed This Encounter   Procedures    Chest AP Portable    CT Head WO Contrast    CBC with Differential (Order)    Comprehensive Metabolic Panel    High Sensitivity Troponin-I    CBC with Differential (Component)    Urinalysis with Reflex to Microscopic Exam and Culture    Urine Elnor Culture Hold Tube    Urine Drugs of Abuse Screen    Ethanol (Alcohol ) Level    Acetaminophen  Level    Salicylate Level    Vital Signs    Vital signs    Document    Behavioral Health Central Access Consult    ECG 12 lead    ECG 12 Lead    Saline lock IV       Encounter Medications:  Medications   sodium chloride  0.9 % bolus 1,000 mL (0 mLs Intravenous Stopped 11/29/23 1754)          Allergies & Medications:     Allergies:  Allergies[1]    Home Medications:  Home Medications       Med List Status: In Progress Set By: Wells, Jaclyn, RN at 11/29/2023  1:58 PM      Status Comment        11/29/2023  1:58 PM     Pt doesn't remember full list              betamethasone  dipropionate (DEL-BETA) 0.05 % cream     Apply topically 2 (two) times daily     Patient not taking: Reported on  04/03/2023     ciclopirox (PENLAC) 8 % solution     APPLY TO AFFECTED NAILS NIGHTLY UNTIL CONDITION IMPROVES AS DIRECTED     clindamycin  (CLEOCIN ) 300 MG capsule     Take 1 capsule (300 mg) by mouth 3 (three) times daily     Patient  not taking: Reported on 04/03/2023     denosumab  (PROLIA ) 60 MG/ML Solution Prefilled Syringe subcutaneous injection     INJECT 60 MG INTO THE SKIN ONCE FOR 1 DOSE EVERY 6 MONTHS     fluticasone (FLONASE) 50 MCG/ACT nasal spray     APPLY ONE SPRAY IN EACH NOSTRIL EVERY DAY     loratadine  (Claritin ) 10 MG tablet     Take 1 tablet (10 mg total) by mouth daily     mupirocin (BACTROBAN) 2 % ointment     Apply topically every 8 (eight) hours     Patient not taking: Reported on 04/03/2023     oxybutynin XL (DITROPAN-XL) 5 MG 24 hr tablet     Take 1 tablet (5 mg) by mouth daily     pravastatin (PRAVACHOL) 20 MG tablet     Take 1 tablet (20 mg) by mouth once at bedtime     pregabalin  (LYRICA ) 200 MG capsule     TAKE ONE CAPSULE BY MOUTH THREE TIMES A DAY     raloxifene  (EVISTA ) 60 MG tablet     Take 1 tablet (60 mg total) by mouth daily.     sodium chloride  (MURO 128) 5 % ophthalmic solution     1 drop as needed     vitamin D , ergocalciferol , (DRISDOL ) 50000 UNIT Cap     Take 1 capsule (50,000 Units) by mouth once a week                                                                     Disclaimer:     The purpose of this note is to serve as a simplified record of care for the patient's extended emergency department course. Please refer to the initial provider note for a full history and physical exam.                [1]   Allergies  Allergen Reactions    Gadolinium      Heart fluttering    Iodine      Heart fluttering    Penicillins Hives        Fabian Frederic BIRCH, MD  01/02/24 [phone]

## 2023-11-30 ENCOUNTER — Encounter (HOSPITAL_BASED_OUTPATIENT_CLINIC_OR_DEPARTMENT_OTHER): Payer: Self-pay | Admitting: Cardiovascular Disease

## 2023-11-30 ENCOUNTER — Ambulatory Visit (INDEPENDENT_AMBULATORY_CARE_PROVIDER_SITE_OTHER): Admitting: Psychiatry

## 2023-11-30 ENCOUNTER — Encounter (HOSPITAL_BASED_OUTPATIENT_CLINIC_OR_DEPARTMENT_OTHER): Payer: Self-pay | Admitting: Psychiatry

## 2023-11-30 VITALS — BP 118/77 | HR 75 | Temp 97.8°F | Resp 14 | Ht 61.0 in | Wt 89.0 lb

## 2023-11-30 DIAGNOSIS — F1290 Cannabis use, unspecified, uncomplicated: Secondary | ICD-10-CM

## 2023-11-30 DIAGNOSIS — F4321 Adjustment disorder with depressed mood: Secondary | ICD-10-CM

## 2023-11-30 DIAGNOSIS — R4189 Other symptoms and signs involving cognitive functions and awareness: Secondary | ICD-10-CM

## 2023-11-30 DIAGNOSIS — F32A Depression, unspecified: Secondary | ICD-10-CM

## 2023-11-30 LAB — LAB USE ONLY - URINE GRAY CULTURE HOLD TUBE

## 2023-11-30 MED ORDER — ESCITALOPRAM OXALATE 10 MG PO TABS: 10.0000 mg | ORAL_TABLET | Freq: Every day | ORAL | 1 refills | Status: AC
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
## 2023-12-04 ENCOUNTER — Telehealth (HOSPITAL_BASED_OUTPATIENT_CLINIC_OR_DEPARTMENT_OTHER): Payer: Self-pay

## 2023-12-04 NOTE — Telephone Encounter (Signed)
 As requested by [facility] provider, wt reached out to pt's contact Ana to provide information on [facility] services.     Wt left VM to reach wt or [facility] PCCM for more information.

## 2024-01-19 ENCOUNTER — Ambulatory Visit: Admitting: Vascular Neurology

## 2024-02-13 ENCOUNTER — Ambulatory Visit: Admitting: Vascular Neurology

## 2024-02-19 ENCOUNTER — Observation Stay
Admission: EM | Admit: 2024-02-19 | Discharge: 2024-02-22 | Disposition: A | Discharge status: Home / Self Care | Attending: Hospitalist | Admitting: Hospitalist

## 2024-02-19 ENCOUNTER — Emergency Department

## 2024-02-19 DIAGNOSIS — E876 Hypokalemia: Secondary | ICD-10-CM | POA: Insufficient documentation

## 2024-02-19 DIAGNOSIS — R4182 Altered mental status, unspecified: Secondary | ICD-10-CM | POA: Insufficient documentation

## 2024-02-19 DIAGNOSIS — R569 Unspecified convulsions: Principal | ICD-10-CM | POA: Insufficient documentation

## 2024-02-19 DIAGNOSIS — Z79899 Other long term (current) drug therapy: Secondary | ICD-10-CM | POA: Insufficient documentation

## 2024-02-19 DIAGNOSIS — S0012XA Contusion of left eyelid and periocular area, initial encounter: Secondary | ICD-10-CM | POA: Insufficient documentation

## 2024-02-19 DIAGNOSIS — E785 Hyperlipidemia, unspecified: Secondary | ICD-10-CM | POA: Insufficient documentation

## 2024-02-19 DIAGNOSIS — D72829 Elevated white blood cell count, unspecified: Secondary | ICD-10-CM | POA: Insufficient documentation

## 2024-02-19 DIAGNOSIS — F1290 Cannabis use, unspecified, uncomplicated: Secondary | ICD-10-CM | POA: Insufficient documentation

## 2024-02-19 DIAGNOSIS — R2689 Other abnormalities of gait and mobility: Secondary | ICD-10-CM | POA: Insufficient documentation

## 2024-02-19 DIAGNOSIS — G8929 Other chronic pain: Secondary | ICD-10-CM | POA: Insufficient documentation

## 2024-02-19 DIAGNOSIS — F17210 Nicotine dependence, cigarettes, uncomplicated: Secondary | ICD-10-CM | POA: Insufficient documentation

## 2024-02-19 DIAGNOSIS — F03911 Unspecified dementia, unspecified severity, with agitation: Secondary | ICD-10-CM | POA: Insufficient documentation

## 2024-02-19 DIAGNOSIS — R636 Underweight: Secondary | ICD-10-CM | POA: Insufficient documentation

## 2024-02-19 DIAGNOSIS — Z8612 Personal history of poliomyelitis: Secondary | ICD-10-CM | POA: Insufficient documentation

## 2024-02-19 DIAGNOSIS — R079 Chest pain, unspecified: Secondary | ICD-10-CM

## 2024-02-19 DIAGNOSIS — W19XXXA Unspecified fall, initial encounter: Secondary | ICD-10-CM | POA: Insufficient documentation

## 2024-02-19 DIAGNOSIS — E871 Hypo-osmolality and hyponatremia: Secondary | ICD-10-CM | POA: Insufficient documentation

## 2024-02-19 DIAGNOSIS — R55 Syncope and collapse: Secondary | ICD-10-CM | POA: Insufficient documentation

## 2024-02-19 DIAGNOSIS — F1490 Cocaine use, unspecified, uncomplicated: Secondary | ICD-10-CM | POA: Insufficient documentation

## 2024-02-19 DIAGNOSIS — G629 Polyneuropathy, unspecified: Secondary | ICD-10-CM | POA: Insufficient documentation

## 2024-02-19 DIAGNOSIS — M549 Dorsalgia, unspecified: Secondary | ICD-10-CM | POA: Insufficient documentation

## 2024-02-19 DIAGNOSIS — F1010 Alcohol abuse, uncomplicated: Secondary | ICD-10-CM | POA: Insufficient documentation

## 2024-02-19 DIAGNOSIS — M810 Age-related osteoporosis without current pathological fracture: Secondary | ICD-10-CM | POA: Insufficient documentation

## 2024-02-19 DIAGNOSIS — Z681 Body mass index (BMI) 19 or less, adult: Secondary | ICD-10-CM | POA: Insufficient documentation

## 2024-02-19 DIAGNOSIS — R9089 Other abnormal findings on diagnostic imaging of central nervous system: Secondary | ICD-10-CM | POA: Insufficient documentation

## 2024-02-19 DIAGNOSIS — Y9259 Other trade areas as the place of occurrence of the external cause: Secondary | ICD-10-CM | POA: Insufficient documentation

## 2024-02-19 LAB — COMPREHENSIVE METABOLIC PANEL
ALT: 12 U/L (ref <=55)
AST (SGOT): 35 U/L (ref <=41)
Albumin/Globulin Ratio: 1 (ref 0.9–2.2)
Albumin: 4.1 g/dL (ref 3.5–4.9)
Alkaline Phosphatase: 65 U/L (ref 37–117)
Anion Gap: 13 (ref 5.0–15.0)
BUN: 14 mg/dL (ref 7–21)
Bilirubin, Total: 0.6 mg/dL (ref 0.2–1.2)
CO2: 25 meq/L (ref 17–29)
Calcium: 9.6 mg/dL (ref 7.9–10.2)
Chloride: 93 meq/L — ABNORMAL LOW (ref 99–111)
Creatinine: 0.9 mg/dL (ref 0.4–1.0)
GFR: >60 mL/min/1.73 m2 (ref >=60.0)
Globulin: 4.2 g/dL — ABNORMAL HIGH (ref 2.0–3.6)
Glucose: 114 mg/dL — ABNORMAL HIGH (ref 70–100)
Potassium: 3 meq/L — ABNORMAL LOW (ref 3.5–5.3)
Protein, Total: 8.3 g/dL (ref 6.0–8.3)
Sodium: 131 meq/L — ABNORMAL LOW (ref 135–145)

## 2024-02-19 LAB — PT AND APTT
INR: 1 (ref 0.9–1.1)
PT: 11.5 s (ref 10.1–12.9)
PTT: 28 s (ref 27–39)

## 2024-02-19 LAB — LAB USE ONLY - CBC WITH DIFFERENTIAL
Absolute Basophils: 0.05 x10 3/uL (ref 0.00–0.08)
Absolute Eosinophils: 0.12 x10 3/uL (ref 0.00–0.44)
Absolute Immature Granulocytes: 0.06 x10 3/uL (ref 0.00–0.07)
Absolute Lymphocytes: 2.87 x10 3/uL (ref 0.42–3.22)
Absolute Monocytes: 1.17 x10 3/uL — ABNORMAL HIGH (ref 0.21–0.85)
Absolute Neutrophils: 6.75 x10 3/uL — ABNORMAL HIGH (ref 1.10–6.33)
Absolute nRBC: 0 x10 3/uL (ref <=0.00)
Basophils %: 0.5 %
Eosinophils %: 1.1 %
Hematocrit: 39.3 % (ref 34.7–43.7)
Hemoglobin: 13.7 g/dL (ref 11.4–14.8)
Immature Granulocytes %: 0.5 %
Lymphocytes %: 26 %
MCH: 31.4 pg (ref 25.1–33.5)
MCHC: 34.9 g/dL (ref 31.5–35.8)
MCV: 90.1 fL (ref 78.0–96.0)
MPV: 9.6 fL (ref 8.9–12.5)
Monocytes %: 10.6 %
Neutrophils %: 61.3 %
Platelet Count: 251 x10 3/uL (ref 142–346)
Preliminary Absolute Neutrophil Count: 6.75 x10 3/uL — ABNORMAL HIGH (ref 1.10–6.33)
RBC: 4.36 x10 6/uL (ref 3.90–5.10)
RDW: 15 % (ref 11–15)
WBC: 11.02 x10 3/uL — ABNORMAL HIGH (ref 3.10–9.50)
nRBC %: 0 /100{WBCs} (ref <=0.0)

## 2024-02-19 LAB — ETHANOL (ALCOHOL) LEVEL: Alcohol: NOT DETECTED

## 2024-02-19 LAB — MAGNESIUM: Magnesium: 2.1 mg/dL (ref 1.6–2.6)

## 2024-02-19 LAB — HIGH SENSITIVITY TROPONIN-I: hs Troponin: 5.9 ng/L (ref <=14.0)

## 2024-02-19 NOTE — ED Provider Notes (Signed)
 [facility]  Emergency Department Physician Note      Diagnosis/Disposition     ED Disposition:  Data Unavailable    ED Diagnosis:  Data Unavailable    Discharge Prescription    No medications on file         History of Present Illness      Nursing Triage Note: Pt BIBA from hotel s/p witnessed seizure & fall. Pt currently stays at hotel where bystander saw her walking, pt began seizing then fell w/ seizure lasting <2min.  EMS reports multiple empty bottles of ETOH in room w/ rx of Lyrica  & additional unlabeled medications. Area of redness under L eye, unsure if from this event. Pt has chronic neck pain. Pt denies headache, vision changes, dizziness, lightheadeness, & n/v. No tenderness noted. Pt is A/Ox3, disoriented to situation.  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    Patient has intermittently been living in hotels after losing her permanent domicile where she lives with her adult son until just recently.  Patient's son passed away unexpectedly several months ago and because of that she had to move into temporary housing.  Family has been assisting her financially including her aunt [name] this was gained from prior history care.  Patient presents after she had possible seizure episode and fall.  Patient has small area of periorbital ecchymoses on the left side.  She reports some left lateral neck discomfort but reports this is baseline for her.  History is questionable as patient does appear intoxicated.  She is moving all extremities equally.  Patient is able to answer questions appropriately and gives some history on her self her face is  symmetrical she is moving her bilateral upper extremities equally no arm drift.  No leg drift.  Normal sensation throughout the face arms and legs.    Patient reported to me that she does have a prior history of seizure disorder however I cannot find evidence in her prior documentation to support this.  She does not appear postictal at this time        Physical Exam     Pulse 83  BP 148/84  Resp 18  SpO2 94 %  Temp 98.2 F (36.8 C)     Physical Exam  Constitutional:       General: She is not in acute distress.     Appearance: She is not ill-appearing, toxic-appearing or diaphoretic.   HENT:      Head: Normocephalic and atraumatic.      Right Ear: Tympanic membrane normal.      Left Ear: Tympanic membrane normal.      Nose: Nose normal.      Mouth/Throat:      Mouth: Mucous membranes are dry.   Eyes:      Extraocular Movements: Extraocular movements intact.      Pupils: Pupils are equal, round, and reactive to light.   Cardiovascular:      Rate and Rhythm: Normal rate and regular rhythm.      Pulses: Normal pulses.      Heart sounds: Normal heart sounds. No murmur heard.  No friction rub. No gallop.   Pulmonary:      Effort: Pulmonary effort is normal.      Breath sounds: Normal breath sounds.   Abdominal:      General: Abdomen is flat. Bowel sounds are normal.      Palpations: Abdomen is soft.      Tenderness: There is no abdominal tenderness.   Musculoskeletal:         General: Normal range of motion.      Cervical back: Normal range of motion and neck supple.   Skin:     General: Skin is warm and dry.      Capillary Refill: Capillary refill takes less than 2 seconds.      Coloration: Skin is not jaundiced.   Neurological:      General: No focal deficit present.      Mental Status: She is alert.      Cranial Nerves: No cranial nerve deficit.      Sensory: No sensory deficit.      Motor: No weakness.      Coordination: Coordination normal.      Gait: Gait normal.      Deep Tendon Reflexes: Reflexes normal.       Comments: Patient is oriented to person and place and was able to some of her history which corroborated with EMS however patient was intermittently talking about her son's if he was a child and said that he was 41 years old when in fact he passed away within the last year at the age of 76 so there is some degree of short-term recall that is missing given the patient's history of dementia this is not surprising however there may be some component as well to postconcussive syndrome versus recent seizure.           Medical Decision Making        Initial Differential Diagnosis:  Initial differential diagnosis to include but not limited to: Alcohol  intoxication, intracranial injury, TIA or CVA unlikely, syncopal episode with myoclonic jerks possible as the patient does not have a prior seizure history, intracranial bleeding possible though less likely, electrolyte abnormalities possible, alcohol  withdrawal seizure possible though the patient does not display overt signs of alcohol  withdrawal at this time, medication reaction possible though less likely    Final Impression:  Syncopal episode versus seizure  Dementia  The patient was admitted and handed off to CNS. We discussed all aspects of the work-up that had been completed in the ED, any pending studies/therapies, and all clinical decision making at the time care was handed off.          Medical Decision Making  Patient's lab work that was available during the time of admission showed mild low sodium and potassium.  These will be repleted on the floor.  EKG shows no dysrhythmia or ischemic changes head CT x-ray CT and C-spine CT show no acutely concerning findings.  Patient was stable while in the emergency department and will remain on seizure precautions.    Alcohol  level negative however my concern for withdrawal seizures is low in this patient    Patient will be admitted to the floor on telemetry for ongoing management.    Amount and/or Complexity of Data  Reviewed  Labs: ordered.  Radiology: ordered.  ECG/medicine tests: ordered.    Risk  Decision regarding hospitalization.  Interpretations         EKG: I reviewed and independently interpreted the patient's EKG as:     EKG shows sinus rhythm at a rate of 76 PR interval 124 QRS 84 QTc 346 no ST or T wave abnormalities concerning for acute ischemia or dysrhythmia      Radiology: All available radiologist's interpretations were reviewed if not separately interpreted by me.           Procedures      Procedures      Supplemental Encounter Data   Medical History[7]  Past Surgical History[8]  Social History[9]  Family History[10]  Allergies[11]    Medications Administered:  Medications - No data to display  Laboratory and Imaging Studies:     CT Head without Contrast    (Results Pending)   CT Face/Maxillofacial Bones (Trauma)    (Results Pending)   CT Cervical Spine without Contrast    (Results Pending)       Attestations     I am the primary attending physician of record for this patient.          [7]   Past Medical History:  Diagnosis Date    Headache     Hyperlipidemia     Low back pain     Meningitis spinal 1956    spinal and regular    Neuropathy     upper c spine issues, + carpal tunnel symptoms bilaterally    Pain     Pneumonia 2015    Polio     Polio 1956    Vertigo    [8]   Past Surgical History:  Procedure Laterality Date    APPENDECTOMY (OPEN)      73 years of age    BACK SURGERY  2005    plate in her neck-    CARPAL TUNNEL RELEASE      bilateral hands 1994 and 1996    EXTRACTION, CATARACT, PHACO, IOL Left 02/28/2019    Procedure: EXTRACTION, CATARACT, PHACO, IOL;  Surgeon: Butler Deatrice ORN, MD;  Location: [facility];  Service: Ophthalmology;  Laterality: Left;  LEFT  EYE PHACO WITH IOL    EXTRACTION, CATARACT, PHACO, IOL Right 03/14/2019    Procedure: EXTRACTION, CATARACT, PHACO, IOL;  Surgeon: Butler Deatrice ORN, MD;  Location: [facility];  Service: Ophthalmology;   Laterality: Right;  RIGHT  EYE PHACO WITH IOL    FRACTURE SURGERY Left     at 73 years of age    HYSTERECTOMY      1995    ORIF, FINGER Left 03/02/2015    Procedure: ORIF, FINGER;  Surgeon: Bolling Oh, MD;  Location: [facility] OR;  Service: Plastics;  Laterality: Left;  LEFT SMALL FINGER METACARPAL ORIF    REPAIR, HAND, NERVE  12/10/2012    Procedure: REPAIR, HAND, NERVE;  Surgeon: Bolling Oh, MD;  Location: [facility];  Service: Plastics;  Laterality: Left;  LEFT SMALL FINGER EXPLORATION; REPAIR DIGITAL NERVE    REPAIR, LIGAMENT Left 03/02/2015    Procedure: REPAIR, LIGAMENT;  Surgeon: Mehan, Vineet, MD;  Location: [facility] OR;  Service: Plastics;  Laterality: Left;  LEFT MIDDLE FINGER RADIAL COLLATERAL LIGAMENT REPAIR   [9]   Social History  Tobacco Use    Smoking status: Every Day     Current packs/day: 0.50     Average packs/day: 0.5 packs/day for 30.0 years (15.0 ttl pk-yrs)     Types: Cigarettes    Smokeless tobacco: Never  Tobacco comments:     5-6 cigarettes per day    Vaping Use    Vaping status: Never Used   Substance Use Topics    Alcohol  use: Yes     Comment: occasional, shots of corazon on special occasions    Drug use: Not Currently     Frequency: 2.0 times per week     Types: Marijuana     Comment: uses fro pain rellief  will hold use 02/28/15   [10]   Family History  Problem Relation Name Age of Onset    Myocardial Infarction Mother      Diverticulitis Sister      Diabetes Maternal Grandmother      Brain cancer Maternal Grandfather      Breast cancer Neg Hx     [11]   Allergies  Allergen Reactions    Gadolinium      Heart fluttering    Iodine      Heart fluttering    Penicillins Hives        Joshua Ronal RAMAN, MD  02/23/24 1409

## 2024-02-19 NOTE — ED Triage Notes (Signed)
 Per bay responder, pt biba from hotel for a witnessed seizure. Pt seize then fall. Ems says there are multiple empty bottles of ETOH. Pt endorsing lower back pain. Pt denies headache, dizziness, sob, n/v.     Pt aaox3(disoriented to time), skin warm and dry, resps even and unlabored, mm moist and pink.

## 2024-02-19 NOTE — ED Notes (Signed)
Bed: S 14  Expected date:   Expected time:   Means of arrival:   Comments:  RTE here

## 2024-02-19 NOTE — ED Notes (Signed)
 Bed: T 2A  Expected date:   Expected time:   Means of arrival: [id number] [place]  Comments:

## 2024-02-20 ENCOUNTER — Observation Stay

## 2024-02-20 ENCOUNTER — Encounter: Payer: Self-pay | Admitting: Internal Medicine

## 2024-02-20 DIAGNOSIS — E871 Hypo-osmolality and hyponatremia: Secondary | ICD-10-CM

## 2024-02-20 DIAGNOSIS — R569 Unspecified convulsions: Principal | ICD-10-CM | POA: Diagnosis present

## 2024-02-20 DIAGNOSIS — E876 Hypokalemia: Secondary | ICD-10-CM

## 2024-02-20 LAB — ECG 12-LEAD
Atrial Rate: 76 {beats}/min
IHS MUSE NARRATIVE AND IMPRESSION: NORMAL
P Axis: -13 degrees
P-R Interval: 124 ms
Q-T Interval: 308 ms
QRS Duration: 84 ms
QTC Calculation (Bezet): 346 ms
R Axis: -20 degrees
T Axis: 26 degrees
Ventricular Rate: 76 {beats}/min

## 2024-02-20 LAB — URINE DRUGS OF ABUSE SCREEN
Urine Amphetamine Screen: NEGATIVE
Urine Barbituate Screen: NEGATIVE
Urine Benzodiazepine Screen: NEGATIVE
Urine Cannabinoid Screen: POSITIVE — AB
Urine Cocaine Screen: POSITIVE — AB
Urine Fentanyl Screen: NEGATIVE
Urine Opiate Screen: NEGATIVE
Urine PCP Screen: NEGATIVE

## 2024-02-20 LAB — URINALYSIS WITH REFLEX TO MICROSCOPIC EXAM - REFLEX TO CULTURE
Urine Bilirubin: NEGATIVE
Urine Blood: NEGATIVE
Urine Glucose: NEGATIVE
Urine Ketones: NEGATIVE mg/dL
Urine Leukocyte Esterase: NEGATIVE
Urine Nitrite: NEGATIVE
Urine Protein: NEGATIVE
Urine Specific Gravity: 1.011 (ref 1.001–1.035)
Urine Urobilinogen: NORMAL mg/dL (ref 0.2–2.0)
Urine pH: 6 (ref 5.0–8.0)

## 2024-02-20 LAB — BASIC METABOLIC PANEL
Anion Gap: 9 (ref 5.0–15.0)
BUN: 10 mg/dL (ref 7–21)
CO2: 28 meq/L (ref 17–29)
Calcium: 9.4 mg/dL (ref 7.9–10.2)
Chloride: 104 meq/L (ref 99–111)
Creatinine: 0.8 mg/dL (ref 0.4–1.0)
GFR: >60 mL/min/1.73 m2 (ref >=60.0)
Glucose: 97 mg/dL (ref 70–100)
Potassium: 3.4 meq/L — ABNORMAL LOW (ref 3.5–5.3)
Sodium: 141 meq/L (ref 135–145)

## 2024-02-20 LAB — MAGNESIUM: Magnesium: 2.2 mg/dL (ref 1.6–2.6)

## 2024-02-20 LAB — VITAMIN B12: Vitamin B-12: 358 pg/mL (ref 211–911)

## 2024-02-20 LAB — THYROID STIMULATING HORMONE (TSH) WITH REFLEX TO FREE T4: TSH: 1.03 u[IU]/mL (ref 0.35–4.94)

## 2024-02-20 MED ORDER — POTASSIUM & SODIUM PHOSPHATES 280-160-250 MG PO PACK
2.0000 | PACK | ORAL | Status: DC | PRN
Start: 2024-02-20 — End: 2024-02-22

## 2024-02-20 MED ORDER — POTASSIUM CHLORIDE 20 MEQ PO PACK
0.0000 meq | PACK | ORAL | Status: DC | PRN
Start: 2024-02-20 — End: 2024-02-22

## 2024-02-20 MED ORDER — POTASSIUM CHLORIDE IN NACL 20-0.9 MEQ/L-% IV SOLN
INTRAVENOUS | Status: DC
Start: 2024-02-20 — End: 2024-02-21
  Filled 2024-02-20: qty 1000

## 2024-02-20 MED ORDER — POTASSIUM CHLORIDE 20 MEQ PO PACK
40.0000 meq | PACK | Freq: Once | ORAL | Status: DC
Start: 2024-02-20 — End: 2024-02-21
  Filled 2024-02-20: qty 2

## 2024-02-20 MED ORDER — HEPARIN SODIUM (PORCINE) 5000 UNIT/ML IJ SOLN
5000.0000 [IU] | Freq: Two times a day (BID) | INTRAMUSCULAR | Status: DC
Start: 2024-02-20 — End: 2024-02-22
  Administered 2024-02-20: 5000 [IU] via SUBCUTANEOUS
  Filled 2024-02-20: qty 1

## 2024-02-20 MED ORDER — MAGNESIUM SULFATE IN D5W 1-5 GM/100ML-% IV SOLN
1.0000 g | INTRAVENOUS | Status: DC | PRN
Start: 2024-02-20 — End: 2024-02-22

## 2024-02-20 MED ORDER — HALOPERIDOL LACTATE 5 MG/ML IJ SOLN
1.0000 mg | Freq: Four times a day (QID) | INTRAMUSCULAR | Status: DC | PRN
Start: 2024-02-20 — End: 2024-02-22
  Administered 2024-02-20: 1 mg via INTRAVENOUS
  Filled 2024-02-20 (×2): qty 1

## 2024-02-20 MED ORDER — POTASSIUM CHLORIDE CRYS ER 20 MEQ PO TBCR
0.0000 meq | EXTENDED_RELEASE_TABLET | ORAL | Status: DC | PRN
Start: 2024-02-20 — End: 2024-02-22
  Administered 2024-02-21: 40 meq via ORAL
  Filled 2024-02-20: qty 2

## 2024-02-20 MED ORDER — PREGABALIN 50 MG PO CAPS
200.0000 mg | ORAL_CAPSULE | Freq: Three times a day (TID) | ORAL | Status: DC
Start: 2024-02-20 — End: 2024-02-22
  Administered 2024-02-20 – 2024-02-22 (×7): 200 mg via ORAL
  Filled 2024-02-20 (×8): qty 4

## 2024-02-20 MED ORDER — RALOXIFENE HCL 60 MG PO TABS
60.0000 mg | ORAL_TABLET | Freq: Every day | ORAL | Status: DC
Start: 2024-02-20 — End: 2024-02-22
  Administered 2024-02-20 – 2024-02-22 (×3): 60 mg via ORAL
  Filled 2024-02-20 (×4): qty 1

## 2024-02-20 MED ORDER — OXYBUTYNIN CHLORIDE ER 5 MG PO TB24
5.0000 mg | ORAL_TABLET | Freq: Every day | ORAL | Status: DC
Start: 2024-02-20 — End: 2024-02-22
  Administered 2024-02-20 – 2024-02-22 (×3): 5 mg via ORAL
  Filled 2024-02-20 (×4): qty 1

## 2024-02-20 MED ORDER — POTASSIUM CHLORIDE 10 MEQ/100ML IV SOLN (WRAP)
10.0000 meq | INTRAVENOUS | Status: DC | PRN
Start: 2024-02-20 — End: 2024-02-22

## 2024-02-20 NOTE — Progress Notes (Signed)
 02/20/24 1945 02/20/24 1948 02/20/24 1949   Vital Signs   Temp 98.3 F (36.8 C)  --   --    Temp src Oral  --   --    Heart Rate 68 81 90   Resp Rate 16  --   --    BP 128/87 128/79 124/81   BP Location Right arm Right arm Right arm   BP Method Automatic Automatic Automatic   MAP (mmHg) 94 93 96   Patient Position Lying Lying Standing      02/20/24 1952   Vital Signs   Temp  --    Temp src  --    Heart Rate 95   Resp Rate  --    BP 129/85   BP Location Right arm   BP Method Automatic   MAP (mmHg) 100   Patient Position Standing

## 2024-02-20 NOTE — Progress Notes (Signed)
 NURSING ADMISSION NOTE          Date of Admission:   02/20/2024     Reason for Admission:   Seizure activity      Admitted from:   ED      Nursing Note:   Patient Aox4. English speaking, and VSS. Oriented patient to the floor and POC. Bed to low position. Call bell within reach. Checked by two nurses that Tele in place with [facility] verified by calling [facility]. Both Nurses verified the rhythm on Tele.     Patient scores as low/mod/high: high. Patient ambulates: 1x assist (unsteady).  Bed alarm on and floor mats in place. Patient belongings reviewed. Patient reports pain as 0/10. Safety interventions in place.      4 eyes in 4 hours pressure injury assessment note:       Completed with: Belky RN  Unit & Time admitted: PSB2 @15 :45                                                                                                     Bony Prominences: Check appropriate box; if wound is present enter wound assessment in LDA              Occiput:                 [x] WNL  []  Wound present  Face:                     [x] WNL  []  Wound present  Ears:                      [x] WNL         []  Wound present  Spine:                    [x] WNL         []  Wound present  Shoulders:             [x] WNL         []  Wound present  Elbows:                  [x] WNL         []  Wound present  Sacrum/coccyx:     [x] WNL         []  Wound present  Ischial Tuberosity:  [x] WNL        []  Wound present  Trochanter/Hip:      [x] WNL       []  Wound present  Knees:                   [x] WNL         []  Wound present  Ankles:                   [x] WNL        []  Wound present  Heels:                    [x] WNL         []   Wound present  Other pressure areas:      []  Wound location        Device related: []  Device name:           LDA completed if wound present: yes/no  Consult WOCN if necessary     Other skin related issues, ie tears, rash, etc, document in Integumentary flowsheet

## 2024-02-20 NOTE — Progress Notes (Incomplete)
 Adult Observation Progress Note    Shift Note:    General: Patient VSS, in no apparent distress at this time.  Neuro: Patient is AOx3. Patient denies numbness or tingling. Perrla.   Cardio: *** on telemetry, verified with [facility]. S1, S2 auscultated. Expiratory wheezing. Wet cough.  Resp: Patient on RA with lung sounds diminished bilaterally on auscultation. Expiratory wheezing. Wet cough.  Integ: Red spots on bilateral arms  MSK: Patient ambulates: independently with: standby assist. low/mod/high: Mod falls risk.   GI: Bowel sounds auscultated all 4 quadrants.   GU: Patient is continent of urine.   IV Access: IV: 18g  in: RAC.     No significant events or provider communication. Patient denies pain at this time, and is resting comfortably in bed.    BM during shift: YES/NO: no  Last BM: 02/20/24    Pending Orders: EEG, MRI, IV fluids,     Discharge Plan: TBD, pending medical clearance    Social/Family Visits: None    POC update: pt updated with POC during bedside report

## 2024-02-20 NOTE — H&P (Signed)
 [facility] HOSPITALIST ADMISSION HISTORY AND PHYSICAL EXAM       Patient Name: Ann Patterson  Primary Care Physician: Din, Anwar U, MD    CC: seizure activity       History of Presenting Illness:   Ann Patterson is a 73 y.o. female with h/o polio, chronic smoker, hyperlipidemia, osteoporosis, back pain, neuropathy, and memory loss per record who was brought to ED by ambulance for seizure activity at a hotel where she has been staying recently.   Per report, bystanders noted generalized diffuse shaking while patient was on the ground.  Patient was awake but slow to respond during evaluation by EMS. No tongue bite or incontinence. No seizure activity during transport or on arrival to ED.   Patient is poor historian. She states she fell while on the hallway in the hotel and remembers falling, and then had LOC for about a minute. She has past history of LOC. She is not aware of having history of seizure. She denies headache, neck pain, or other pain. She denies SOB, palpitations, abdominal pain, vomiting, diarrhea, dysuria, cough, or fever. She is feeling cold while in ED. She does not know names of her medications except for lyrica .   Patient states she rarely drinks alcohol  but had a been during the day. Denies drug abuse (past drug screen positive for cannabinoid).         Past Medical History:   Medical History[1]    Past Surgical History:   Past Surgical History[2]    Family History:   Family History[3]    Social History:   Social History[4]    Allergies:   Allergies[5]    Medications:     Home Medications       Med List Status: In Progress Set By: Cleotilde Collar, RN at 02/19/2024 11:02 PM              ciclopirox (PENLAC) 8 % solution     APPLY TO AFFECTED NAILS NIGHTLY UNTIL CONDITION IMPROVES AS DIRECTED     denosumab  (PROLIA ) 60 MG/ML Solution Prefilled Syringe subcutaneous injection     INJECT 60 MG INTO THE SKIN ONCE FOR 1 DOSE EVERY 6 MONTHS     escitalopram  (LEXAPRO ) 10 MG tablet (Expired)      Take 1 tablet (10 mg) by mouth once daily For depression     fluticasone (FLONASE) 50 MCG/ACT nasal spray     APPLY ONE SPRAY IN EACH NOSTRIL EVERY DAY     oxybutynin XL (DITROPAN-XL) 5 MG 24 hr tablet     Take 1 tablet (5 mg) by mouth daily     pravastatin (PRAVACHOL) 20 MG tablet     Take 1 tablet (20 mg) by mouth once at bedtime     pregabalin  (LYRICA ) 200 MG capsule     Take 1 capsule (200 mg) by mouth 3 (three) times daily     raloxifene  (EVISTA ) 60 MG tablet     Take 1 tablet (60 mg total) by mouth daily.     sodium chloride  (MURO 128) 5 % ophthalmic solution     1 drop as needed     vitamin D , ergocalciferol , (DRISDOL ) 50000 UNIT Cap     Take 1 capsule (50,000 Units) by mouth once a week             Review of Systems:   All other systems were reviewed and are negative except: as above    Physical Exam:     Vitals:  02/20/24 0347 02/20/24 0445 02/20/24 0458 02/20/24 0515   BP: 105/62 118/59 113/63 160/72   Pulse: 64 (!) 58 60 70   Resp:    18   Temp:       TempSrc:       SpO2: 94% 93% 92% 92%   Weight:       Height:            General: Frail elderly female. Cachectic. No acute distress.  HEENT: PERRLA, EOMI,  Anicteric sclera. Dry mucous membranes  Neck: Supple. No lymphadenopathy. No JVD. No carotid bruits  Cardiovascular: Regular rate and rhythm. No murmurs, rubs or gallops  Lungs: Clear to auscultation bilaterally. No wheezing, rhonchi, or rales  Abdomen: Soft,. Non-tender. Non-distended.    Extremities: No clubbing. No cyanosis. No edema  Neuro: No focal deficit noted.   Skin: No rashes or lesions noted         Labs:     Results for orders placed or performed during the hospital encounter of 02/19/24 (from the past 24 hours)   Comprehensive Metabolic Panel    Collection Time: 02/19/24 11:11 PM   Result Value    Glucose 114 (H)    BUN 14    Creatinine 0.9    Sodium 131 (L)    Potassium 3.0 (L)    Chloride 93 (L)    CO2 25    Calcium 9.6    Anion Gap 13.0    GFR >60.0    AST (SGOT) 35    ALT 12    Alkaline  Phosphatase 65    Albumin 4.1    Protein, Total 8.3    Globulin 4.2 (H)    Albumin/Globulin Ratio 1.0    Bilirubin, Total 0.6   Ethanol (Alcohol ) Level    Collection Time: 02/19/24 11:11 PM   Result Value    Alcohol  Not Detected   PT/APTT    Collection Time: 02/19/24 11:11 PM   Result Value    PT 11.5    INR 1.0    PTT 28    Collection Time: 02/19/24 11:11 PM   Result Value    Magnesium  2.1   High Sensitivity Troponin-I    Collection Time: 02/19/24 11:11 PM   Result Value    hs Troponin 5.9   CBC with Differential (Component)    Collection Time: 02/19/24 11:11 PM   Result Value    WBC 11.02 (H)    Hemoglobin 13.7    Hematocrit 39.3    Platelet Count 251    MPV 9.6    RBC 4.36    MCV 90.1    MCH 31.4    MCHC 34.9    RDW 15    nRBC % 0.0    Absolute nRBC 0.00    Preliminary Absolute Neutrophil Count 6.75 (H)    Neutrophils % 61.3    Lymphocytes % 26.0    Monocytes % 10.6    Eosinophils % 1.1    Basophils % 0.5    Immature Granulocytes % 0.5    Absolute Neutrophils 6.75 (H)    Absolute Lymphocytes 2.87    Absolute Monocytes 1.17 (H)    Absolute Eosinophils 0.12    Absolute Basophils 0.05    Absolute Immature Granulocytes 0.06       CT Head without Contrast  Result Date: 02/19/2024  1.Mild chronic small vessel ischemic disease. 2.No acute intracranial hemorrhage or acute cranial fractures. 3.No acute facial fractures are seen. Krzysztof M. Trixie, MD 02/19/2024  11:38 PM    CT Face/Maxillofacial Bones (Trauma)  Result Date: 02/19/2024  1.Mild chronic small vessel ischemic disease. 2.No acute intracranial hemorrhage or acute cranial fractures. 3.No acute facial fractures are seen. Krzysztof M. Bochenek, MD 02/19/2024 11:38 PM    CT Cervical Spine without Contrast  Result Date: 02/19/2024   1.Postoperative and degenerative changes. 2.No CT evidence of an acute osseous injury to the cervical spine. Krzysztof M. Bochenek, MD 02/19/2024 11:36 PM      EKG Results       Procedure Component Value Units Date/Time    ECG 12 lead  [[id number]] Collected: 02/19/24 2302     Updated: 02/19/24 2305     Ventricular Rate 76 BPM      Atrial Rate 76 BPM      P-R Interval 124 ms      QRS Duration 84 ms      Q-T Interval 308 ms      QTC Calculation (Bezet) 346 ms      P Axis -13 degrees      R Axis -20 degrees      T Axis 26 degrees      IHS MUSE NARRATIVE AND IMPRESSION --     NORMAL SINUS RHYTHM  NORMAL ECG  WHEN COMPARED WITH ECG OF 29-Nov-2023 14:29,  CRITERIA FOR SEPTAL INFARCT ARE NO LONGER PRESENT  NONSPECIFIC T WAVE ABNORMALITY NOW EVIDENT IN INFERIOR LEADS  QT HAS SHORTENED          Assessment/Plan:   Seizure like activity: seizure vs convulsive syncope   Hyponatremia, hypovolumic   Hypokalemia   - admit   - telemetry   - orthostatics   - seizure precautions   - MRI brain, EEG   - IV fluid: NS with Kcl  - follow up BMP     Chronic back pain   Hyperlipidemia  Osteoporosis   - continue PTA medications     DVT prophylaxis  - SCD, heparin subQ            Patient has a BMI of 16.7 kg/m2    Underweight       Recent Labs     02/19/24  2311   Sodium 131*     Diagnosis: Mild Hyponatremia  Recent Labs     02/19/24  2311   Potassium 3.0*     Diagnosis: Hypokalemia       Cr Baseline Estimation (minimum in last 3 months): 0.6 mg/dL  Maximum Cr in last 36 hours: 0.9 mg/dL    Recent Labs (Last 3 Months)     02/19/24  2311 11/29/23  1405   Creatinine 0.9 0.6   BUN 14 13       Acute Kidney Injury Diagnosis: No additional ACUTE diagnosis         Admission status:  Observation     Signed by: Justus JULIANNA Most, MD, MD   cc:Din, Jeryl PENNER, MD          [1]   Past Medical History:  Diagnosis Date    Headache     Hx of fall 04/05/2018    Hyperlipidemia     Low back pain     Memory loss 02/07/2011    Meningitis spinal 1956    spinal and regular    Neuropathy     upper c spine issues, + carpal tunnel symptoms bilaterally    Osteoporosis 12/07/2020    Pneumonia 2015    Polio 1956    Vertigo    [  2]   Past Surgical History:  Procedure Laterality Date    APPENDECTOMY  (OPEN)      74 years of age    BACK SURGERY  2005    plate in her neck-    CARPAL TUNNEL RELEASE      bilateral hands 1994 and 1996    EXTRACTION, CATARACT, PHACO, IOL Left 02/28/2019    Procedure: EXTRACTION, CATARACT, PHACO, IOL;  Surgeon: Butler Deatrice ORN, MD;  Location: [facility];  Service: Ophthalmology;  Laterality: Left;  LEFT  EYE PHACO WITH IOL    EXTRACTION, CATARACT, PHACO, IOL Right 03/14/2019    Procedure: EXTRACTION, CATARACT, PHACO, IOL;  Surgeon: Butler Deatrice ORN, MD;  Location: [facility] OR;  Service: Ophthalmology;  Laterality: Right;  RIGHT  EYE PHACO WITH IOL    FRACTURE SURGERY Left     at 73 years of age    HYSTERECTOMY      1995    ORIF, FINGER Left 03/02/2015    Procedure: ORIF, FINGER;  Surgeon: Bolling Oh, MD;  Location: [facility] OR;  Service: Plastics;  Laterality: Left;  LEFT SMALL FINGER METACARPAL ORIF    REPAIR, HAND, NERVE  12/10/2012    Procedure: REPAIR, HAND, NERVE;  Surgeon: Bolling Oh, MD;  Location: [facility];  Service: Plastics;  Laterality: Left;  LEFT SMALL FINGER EXPLORATION; REPAIR DIGITAL NERVE    REPAIR, LIGAMENT Left 03/02/2015    Procedure: REPAIR, LIGAMENT;  Surgeon: Mehan, Vineet, MD;  Location: [facility] OR;  Service: Plastics;  Laterality: Left;  LEFT MIDDLE FINGER RADIAL COLLATERAL LIGAMENT REPAIR   [3]   Family History  Problem Relation Name Age of Onset    Myocardial Infarction Mother      Diverticulitis Sister      Diabetes Maternal Grandmother      Brain cancer Maternal Grandfather      Breast cancer Neg Hx     [4]   Social History  Socioeconomic History    Marital status: Single   Tobacco Use    Smoking status: Every Day     Current packs/day: 0.50     Average packs/day: 0.5 packs/day for 30.0 years (15.0 ttl pk-yrs)     Types: Cigarettes    Smokeless tobacco: Never    Tobacco comments:     5-6 cigarettes per day    Vaping Use    Vaping status: Never Used   Substance and Sexual Activity    Alcohol  use: Yes     Comment:  occasional, shots of corazon on special occasions    Drug use: Not Currently     Frequency: 2.0 times per week     Types: Marijuana     Comment: uses fro pain rellief  will hold use 02/28/15     Social Drivers of Health     Food Insecurity: Food Insecurity Present (11/29/2023)    Hunger Vital Sign     Worried About Running Out of Food in the Last Year: Often true     Ran Out of Food in the Last Year: Often true   Transportation Needs: Unmet Transportation Needs (11/29/2023)    PRAPARE - Therapist, Art (Medical): Yes     Lack of Transportation (Non-Medical): Yes   Intimate Partner Violence: Not At Risk (11/29/2023)    Humiliation, Afraid, Rape, and Kick questionnaire     Fear of Current or Ex-Partner: No     Emotionally Abused: No     Physically  Abused: No     Sexually Abused: No   Housing Stability: At Risk (11/29/2023)    Housing Stability NCSS     Do you have housing?: No     Are you worried about losing your housing?: Yes   [5]   Allergies  Allergen Reactions    Gadolinium      Heart fluttering    Iodine      Heart fluttering    Penicillins Hives

## 2024-02-20 NOTE — ED to IP RN Note (Signed)
 [facility] EMERGENCY DEPT  ED NURSING NOTE FOR THE RECEIVING INPATIENT NURSE   ED NURSE [place]   [place] [id number]   ED CHARGE RN Brittni   ADMISSION INFORMATION   Ann Patterson is a 73 y.o. female admitted with an ED diagnosis of:    1. Altered mental status         Isolation: None   Allergies: Gadolinium, Iodine, and Penicillins   Holding Orders confirmed? No   Belongings Documented? No   Home medications sent to pharmacy confirmed? No   NURSING CARE   Patient Comes From:   Mental Status: Home Independent  alert and confused   ADL: Needs assistance with ADLs   Ambulation: 1 person assist   Pertinent Information  and Safety Concerns:     Broset Violence Risk Level: Moderate Per bay responder, pt biba from hotel for a witnessed seizure. Pt seize then fall. Ems says there are multiple empty bottles of ETOH. Pt endorsing lower back pain. Pt denies headache, dizziness, sob, n/v.      Pt aaox3(disoriented to time), skin warm and dry, resps even and unlabored, mm moist and pink.         CT / NIH   CT Head ordered on this patient?  Yes   NIH/Dysphagia assessment done prior to admission? No   VITAL SIGNS (at the time of this note)      Vitals:    02/20/24 0120   BP: 135/68   Pulse: 85   Resp: 18   Temp:    SpO2: 93%     Pain Score: 0-No pain (02/19/24 2236)

## 2024-02-20 NOTE — Plan of Care (Addendum)
 Patient was seen and examined at the bedside.Does not want to bothered, wants to sleep, tired.    Vitals:    02/20/24 1541   BP: 126/77   Pulse: 64   Resp: 18   Temp: 98.2 F (36.8 C)   SpO2: 96%     Seizure like activity: seizure vs convulsive syncope   Hyponatremia, hypovolumic   Hypokalemia   - telemetry   - orthostatics -check   - seizure precautions   - MRI brain, EEG -pending   - IV fluid: NS with Kcl  - follow up BMP - added on, replace per protocol      Chronic back pain   Hyperlipidemia  Osteoporosis   - continue PTA medications      DVT prophylaxis  - SCD, heparin subQ

## 2024-02-20 NOTE — Progress Notes (Addendum)
 Patient is complaining that someone has stolen her sunglasses and pink reading glasses. When patient arrived, there was only a pair of blue reading glasses in her purse. ED called and they are unable to locate the glasses.

## 2024-02-20 NOTE — Plan of Care (Signed)
 Problem: Moderate/High Fall Risk Score >5  Goal: Patient will remain free of falls  Outcome: Progressing  Flowsheets (Taken 02/20/2024 2000)  High (Greater than 13):   HIGH-Consider use of low bed   HIGH-Initiate use of floor mats as appropriate   HIGH-Apply yellow Fall Risk arm band   HIGH-Utilize chair pad alarm for patient while in the chair   HIGH-Bed alarm on at all times while patient in bed   HIGH-Visual cue at entrance to patient's room     Problem: Compromised Sensory Perception  Goal: Sensory Perception Interventions  Outcome: Progressing  Flowsheets (Taken 02/20/2024 0920 by Kim Maillard, RN)  Sensory Perception Interventions: Offload heels, Pad bony prominences, Reposition q 2hrs/turn Clock, Q2 hour skin assessment under devices if present     Problem: Compromised Moisture  Goal: Moisture level Interventions  Outcome: Progressing  Flowsheets (Taken 02/20/2024 0920 by Kim Maillard, RN)  Moisture level Interventions: Moisture wicking products, Moisture barrier cream     Problem: Compromised Activity/Mobility  Goal: Activity/Mobility Interventions  Outcome: Progressing  Flowsheets (Taken 02/20/2024 0920 by Kim Maillard, RN)  Activity/Mobility Interventions: Pad bony prominences, TAP Seated positioning system when OOB, Promote PMP, Reposition q 2 hrs / turn clock, Offload heels     Problem: Neurological Deficit  Goal: Neurological status is stable or improving  Outcome: Progressing  Flowsheets (Taken 02/20/2024 2012)  Neurological status is stable or improving:   Observe for seizure activity and initiate seizure precautions if indicated   Perform CAM Assessment     Problem: Potential for Aspiration  Goal: Risk of aspiration will be minimized  Outcome: Progressing     Problem: Peripheral Neurovascular Impairment  Goal: Extremity color, movement, sensation are maintained or improved  Outcome: Progressing     Problem: Impaired Mobility  Goal: Mobility/Activity is maintained at optimal level  for patient  Outcome: Progressing  Flowsheets (Taken 02/20/2024 2012)  Mobility/activity is maintained at optimal level for patient:   Encourage independent activity per ability   Consult/collaborate with Physical Therapy and/or Occupational Therapy     Problem: Communication Impairment  Goal: Will be able to express needs and understand communication  Outcome: Progressing  Flowsheets (Taken 02/20/2024 2012)  Able to express needs and understand communication:   Provide alternative method of communication if needed   Consult/collaborate with Case Management/Social Work   Include patient care companion in decisions related to communication   Patient/patient care companion demonstrates understanding on disease process, treatment plan, medications and discharge plan

## 2024-02-20 NOTE — Progress Notes (Signed)
 Patient is requesting to go out to have a cigarette. This RN informed her that she cannot leave to smoke. Patient became very angry and aggressive. Patient pacing around the room. She is high fall risk and very unsteady. Charge RN Rosina called to bedside and security called.   Informed patient that she can leave AMA but will have to come back in through the ED. Patient refusing to leave AMA, she just wants to get a cigarette. Patient requests to speak to [name] (boyfriend), but does not know his phone number. All the phone numbers in her chart are unavailable or straight to voicemail.   Patient refusing meds and IV fluids.     MD Phayal aware and spoke with patient on the phone.

## 2024-02-20 NOTE — ED Notes (Signed)
 Bed: N 35  Expected date:   Expected time:   Means of arrival:   Comments:  16 here

## 2024-02-20 NOTE — Progress Notes (Signed)
 VIRTUAL NURSE ADMISSION ASSESSMENT        Date and time: 02/20/24 5:11 PM  Patient name: Ann Patterson  Attending Physician:  Odis Nissen, MD    VIRTUAL NURSE:  INTERPRETER:  Heron  N/A   BEDSIDE NURSE:  Nat Edelman   Reason for Admission: Altered mental status [R41.82]   Date of Admission:  02/19/2024     Initial Assessment:   Dentures/ Hearing Aids/ Glasses?: none      LBM: 02/20/24     Point of contact : [facility]     What is most important to pt during stay? Go home       Important/Safety Information:   Mobility prior to admission: independent     Mobility devices used prior to admission: cane, walker     Pt admitted from (home/ NH/ SNF): hotel       Admission Data Collected:               YES

## 2024-02-20 NOTE — PT Eval Note (Signed)
 Physical Therapy Evaluation  Ann Patterson      Post Acute Care Therapy Recommendations:     Discharge Recommendations:  SNF    If SNF  recommended discharge disposition is not available, patient will need hands on assist for all mobility and HHPT.     DME needs IF patient is discharging home: Front wheel walker    Therapy discharge recommendations may change with patient status.  Please refer to most recent note for up-to-date recommendations.    Assessment:   Significant Findings: poor safety awareness with mobility    Ann Patterson is a 73 y.o. female admitted 02/19/2024.  Patient presents A and O x person, place, not date and questionably confused to situation.  She demonstrates functional strength and ROM throughout extremities except L LE with baseline mild increased tone from long standing effects of polio per pt.  She is able to mobilize out of bed and ambulate in hallway with min A, moderate cues for safety and walker management.  Recommend SNF rehab at d/c to maximize functional independence.        Therapy Diagnosis: impaired functional mobility    Rehabilitation Potential: good    Treatment Activities: functional mobility training, gait training, therapeutic exercise, pt education  Educated the patient to role of physical therapy, plan of care, goals of therapy and HEP, safety with mobility and ADLs, energy conservation techniques.    Plan:        Treatment/Interventions: functional mobility, gait training, therapeutic exercise, pt education    Risks/benefits/POC discussed with patient      Unit: [facility] EMERGENCY DEPT  Bed: N 35/N 35     Precautions and Contraindications:   Fall      Consult received for Ann Patterson for PT Evaluation and Treatment.  Patient's medical condition is appropriate for Physical Therapy intervention at this time.    History of Present Illness:   Ann Patterson is a 73 y.o. female admitted on 02/19/2024 with found down with  questionable seizure activity.      Medical Diagnosis: Altered mental status [R41.82]    Past Medical/Surgical History:  Medical History[1]  Past Surgical History[2]    Imaging/Tests/Labs:  CT Head without Contrast  Result Date: 02/19/2024  1.Mild chronic small vessel ischemic disease. 2.No acute intracranial hemorrhage or acute cranial fractures. 3.No acute facial fractures are seen. Krzysztof M. Bochenek, MD 02/19/2024 11:38 PM    CT Face/Maxillofacial Bones (Trauma)  Result Date: 02/19/2024  1.Mild chronic small vessel ischemic disease. 2.No acute intracranial hemorrhage or acute cranial fractures. 3.No acute facial fractures are seen. Krzysztof M. Bochenek, MD 02/19/2024 11:38 PM    CT Cervical Spine without Contrast  Result Date: 02/19/2024   1.Postoperative and degenerative changes. 2.No CT evidence of an acute osseous injury to the cervical spine. Krzysztof M. Bochenek, MD 02/19/2024 11:36 PM        Social History:   Prior Level of Function:  Prior level of function: Independent with ADLs, Ambulates independently, Ambulates with assistive device  Assistive Device: Single point cane  Baseline Activity Level: Community ambulation  Ambulated 100 feet or more prior to admission: Yes    Home Living Arrangements:  Living Arrangements: Friends  Type of Home: Other (Comment)  Home Layout: Other (Comment)    Subjective:  [place]   Patient is agreeable to participation in the therapy session. Nursing clears patient for therapy.     Patient Goal: to get home  Pain:   Scale: denies    Objective:   Patient is in bed with dressings, telemetry, peripheral IV, and female external catheter in place.  Pt wore mask during therapy session:No      Cognitive Status and Neuro Exam:  A and O x 3. Follows all commands.  Sensation: grossly intact to light touch throughout  Vision: grossly intact. Denies blurred or double vision.  R hand dominant.    Musculoskeletal Examination  RUE ROM:  WFL  LUE ROM: WFL  RLE ROM: WFL  LLE ROM: WFL with stretch, clonus.    RUE Strength: grossly 5/5  LUE Strength: grossly 5/5  RLE Strength: grossly 5/5  LLE Strength: grossly 5/5    Functional Mobility  Rolling: min A  Supine to Sit: min A  Scooting: min A  Sit to Stand: min A  Stand to Sit: min A  Transfers: min A        Ambulation  PMP - Progressive Mobility Protocol   PMP Activity: Step 7 - Walks out of Room  Distance Walked (ft) (Step 6,7): 100 Feet   Level of assistance required: min A  Pattern: multiple path deviations with assist to correct walker. Multiple losses of balance and poor maneuvering obstacles.    Device Used: wheeled walker  Weightbearing Status: no restrictions      Balance  Static Sitting: fair  Dynamic Sitting: fair  Static Standing: fair-  Dynamic Standing: fair-    Participation and Activity Tolerance  Participation Effort: good  Endurance: good    AM-PACT Inpatient Short Forms  Inpatient AM-PACT Performed? (PT): Basic Mobility Inpatient Short Form  AM-PACT 6 Clicks Basic Mobility Inpatient Short Form  Turning Over in Bed: A little  Sitting Down On/Standing From Armchair: A little  Lying on Back to Sitting on Side of Bed: A little  Assist Moving to/from Bed to Chair: A little  Assist to Walk in Hospital Room: A little  Assist to Climb 3-5 Steps with Railing: A lot  PT Basic Mobility Raw Score: 17  CMS 0-100% Score: 50.57%      Patient left with call bell within reach, all needs met, SCDs n/a, fall mat n/a, bed alarm in place, chair alarm n/a and all questions answered. RN notified of session outcome and patient response.     Goals:  Goals  Goal Formulation: With patient  Time for Goal Acheivement: 5 visits  Goals: Select goal  Pt Will Go Supine To Sit: with stand by assist  Pt Will Perform Sit To Supine: with stand by assist  Pt Will Perform Sit to Stand: with stand by assist  Pt Will Transfer Bed/Chair: with rolling walker, with stand by assist  Pt Will Ambulate: > 200 feet, with rolling  walker, with stand by assist      PPE worn during session: gloves        Ann Patterson, PT, DPT  Pager [phone]      Time of Treatment  PT Received On: 02/20/24  Start Time: 0750  Stop Time: 0815  Time Calculation (min): 25 min         [1]   Past Medical History:  Diagnosis Date    Headache     Hx of fall 04/05/2018    Hyperlipidemia     Low back pain     Memory loss 02/07/2011    Meningitis spinal 1956    spinal and regular    Neuropathy     upper c spine  issues, + carpal tunnel symptoms bilaterally    Osteoporosis 12/07/2020    Pneumonia 2015    Polio 1956    Vertigo    [2]   Past Surgical History:  Procedure Laterality Date    APPENDECTOMY (OPEN)      73 years of age    BACK SURGERY  2005    plate in her neck-    CARPAL TUNNEL RELEASE      bilateral hands 1994 and 1996    EXTRACTION, CATARACT, PHACO, IOL Left 02/28/2019    Procedure: EXTRACTION, CATARACT, PHACO, IOL;  Surgeon: Butler Deatrice ORN, MD;  Location: [facility] SURGERY OR;  Service: Ophthalmology;  Laterality: Left;  LEFT  EYE PHACO WITH IOL    EXTRACTION, CATARACT, PHACO, IOL Right 03/14/2019    Procedure: EXTRACTION, CATARACT, PHACO, IOL;  Surgeon: Butler Deatrice ORN, MD;  Location: [facility] SURGERY OR;  Service: Ophthalmology;  Laterality: Right;  RIGHT  EYE PHACO WITH IOL    FRACTURE SURGERY Left     at 73 years of age    HYSTERECTOMY      1995    ORIF, FINGER Left 03/02/2015    Procedure: ORIF, FINGER;  Surgeon: Bolling Oh, MD;  Location: [facility] OR;  Service: Plastics;  Laterality: Left;  LEFT SMALL FINGER METACARPAL ORIF    REPAIR, HAND, NERVE  12/10/2012    Procedure: REPAIR, HAND, NERVE;  Surgeon: Bolling Oh, MD;  Location: [facility];  Service: Plastics;  Laterality: Left;  LEFT SMALL FINGER EXPLORATION; REPAIR DIGITAL NERVE    REPAIR, LIGAMENT Left 03/02/2015    Procedure: REPAIR, LIGAMENT;  Surgeon: Mehan, Vineet, MD;  Location: [facility] OR;  Service: Plastics;  Laterality: Left;  LEFT MIDDLE FINGER RADIAL COLLATERAL  LIGAMENT REPAIR

## 2024-02-20 NOTE — OT Eval Note (Signed)
 Occupational Therapy Eval Ann Patterson        Post Acute Care Therapy Recommendations:     Discharge Recommendations:  SNF    If SNF  recommended discharge disposition is not available, patient will need hands on assist for ADL's, functional mobility/transfers, and HHOT.     DME needs IF patient is discharging home: Paediatric nurse, Front wheel walker    Therapy discharge recommendations may change with patient status.  Please refer to most recent note for up-to-date recommendations.      Assessment:   Significant Findings: none    Ann Patterson is a 73 y.o. female admitted 02/19/2024. Pt received in bed and was agreeable to OT. Pt able to complete bed mobility with SBA, functional transfers with minA with RW, minA with LBD. Pt required significant cues for hand/foot placement for safety and noted to bump into objects on both the L and R side. Pt presents with decreased strength, decreased cognition, decreased endurance/activity tolerance, balance deficits, decreased safety awareness, and impaired insight into deficits limiting her performance in ADL's. Rec SNF. Pt is below functional baseline, and will benefit from acute OT services to optimize return to PLOF.       Therapy Diagnosis: decreased ADL performance and generalized weakness    Rehabilitation Potential: Good    Educated the patient to role of occupational therapy, plan of care, goals of therapy and safety with mobility and ADLs, energy conservation techniques.    Plan:   OT Frequency Recommended: 2-3x/wk     Treatment/Interventions: Treatment Interventions: ADL retraining, Functional transfer training, Endurance training, Patient/Family training, Equipment eval/education, Compensatory technique education, Continued evaluation    Risks/benefits/POC discussed with Pt       Unit: [facility]  Bed: N 35/N 35        Precautions and Contraindications:   Weight Bearing Status: no restrictions  Other Precautions: falls, seizure  precautions    Consult received for Ann Patterson for OT Evaluation and Treatment.  Patient's medical condition is appropriate for Occupational Therapy intervention at this time.      History of Present Illness:    Ann Patterson is a 73 y.o. female admitted on 02/19/2024 with seizure activity at a hotel where she has been staying recently.  per H&P.    Admitting Diagnosis: Altered mental status [R41.82]    Past Medical/Surgical History:  PMH: reviewed  PSH: reviewed   [Medical History]    [Medical History]  Past Medical History  Diagnosis Date    Headache     Hx of fall 04/05/2018    Hyperlipidemia     Low back pain     Memory loss 02/07/2011    Meningitis spinal 1956    spinal and regular    Neuropathy     upper c spine issues, + carpal tunnel symptoms bilaterally    Osteoporosis 12/07/2020    Pneumonia 2015    Polio 1956    Vertigo      [Past Surgical History]    [Past Surgical History]  Procedure Laterality Date    APPENDECTOMY (OPEN)      73 years of age    BACK SURGERY  2005    plate in her neck-    CARPAL TUNNEL RELEASE      bilateral hands 1994 and 1996    EXTRACTION, CATARACT, PHACO, IOL Left 02/28/2019    Procedure: EXTRACTION, CATARACT, PHACO, IOL;  Surgeon: Butler Deatrice ORN, MD;  Location: [facility] SURGERY OR;  Service: Ophthalmology;  Laterality: Left;  LEFT  EYE PHACO WITH IOL    EXTRACTION, CATARACT, PHACO, IOL Right 03/14/2019    Procedure: EXTRACTION, CATARACT, PHACO, IOL;  Surgeon: Butler Deatrice ORN, MD;  Location: [facility] SURGERY OR;  Service: Ophthalmology;  Laterality: Right;  RIGHT  EYE PHACO WITH IOL    FRACTURE SURGERY Left     at 73 years of age    HYSTERECTOMY      1995    ORIF, FINGER Left 03/02/2015    Procedure: ORIF, FINGER;  Surgeon: Bolling Oh, MD;  Location: [facility] OR;  Service: Plastics;  Laterality: Left;  LEFT SMALL FINGER METACARPAL ORIF    REPAIR, HAND, NERVE  12/10/2012    Procedure: REPAIR, HAND, NERVE;  Surgeon: Bolling Oh, MD;  Location: [facility];  Service: Plastics;  Laterality: Left;  LEFT SMALL FINGER EXPLORATION; REPAIR DIGITAL NERVE    REPAIR, LIGAMENT Left 03/02/2015    Procedure: REPAIR, LIGAMENT;  Surgeon: Mehan, Vineet, MD;  Location: [facility] OR;  Service: Plastics;  Laterality: Left;  LEFT MIDDLE FINGER RADIAL COLLATERAL LIGAMENT REPAIR       Imaging/Tests/Labs:  Rad: reviewed   CT Head without Contrast  Result Date: 02/19/2024  1.Mild chronic small vessel ischemic disease. 2.No acute intracranial hemorrhage or acute cranial fractures. 3.No acute facial fractures are seen. Ann M. Bochenek, MD 02/19/2024 11:38 PM    CT Face/Maxillofacial Bones (Trauma)  Result Date: 02/19/2024  1.Mild chronic small vessel ischemic disease. 2.No acute intracranial hemorrhage or acute cranial fractures. 3.No acute facial fractures are seen. Ann M. Bochenek, MD 02/19/2024 11:38 PM    CT Cervical Spine without Contrast  Result Date: 02/19/2024   1.Postoperative and degenerative changes. 2.No CT evidence of an acute osseous injury to the cervical spine. Ann M. Bochenek, MD 02/19/2024 11:36 PM    Lab Results   Component Value Date/Time    HGB 13.7 02/19/2024 11:11 PM    HGB 14.3 03/25/2021 12:31 PM    HCT 39.3 02/19/2024 11:11 PM    HCT 42.7 03/25/2021 12:31 PM    K 3.0 (L) 02/19/2024 11:11 PM    K 4.9 09/19/2023 12:21 PM    NA 131 (L) 02/19/2024 11:11 PM    NA 141 09/19/2023 12:21 PM    INR 1.0 02/19/2024 11:11 PM    INR 1.0 02/27/2015 01:39 PM    TROPI 5.9 02/19/2024 11:11 PM    TROPI <2.7 11/29/2023 02:05 PM       Social History:   Prior level of function: Independent with ADLs, Ambulates independently, Ambulates with assistive device  Assistive Device: Single point cane  Baseline Activity Level: Community ambulation  Driving: does not drive  Cooking: independent  DME Currently at Home: [employer], [employer] Living Arrangements:  Living Arrangements: Friends  Type of Home: Other (Comment)  Home Layout: Other (Comment)  Bathroom  Shower/Tub: Medical Sales Representative: Standard     Home Living - Notes / Comments: pt reports her friend has been staying with her recently    Subjective: Subjective: I can walk without this walker     Patient is agreeable to participation in therapy session and Nursing clears patient for therapy.    Patient Goal: Patient Goal: go home  Pain:   Pain Assessment: No/denies pain                Pain interventions: N/A    Objective:   Patient is in bed with telemetry  and peripheral IV in place.  Pt wore mask during therapy session:No       Vital Signs:  Stable with no signs/symptoms of distress    Cognitive Status and Neuro Exam:  Arousal/Alertness: Appropriate responses to stimuli  Attention Span: Appears intact  Orientation Level: Oriented to person, Oriented to place  Memory: Decreased recall of recent events  Following Commands: minimal verbal instruction  Safety Awareness: moderate verbal instruction  Insights: Decreased awareness of deficits, Educated in safety awareness  Problem Solving: Assistance required to identify errors made, Assistance required to generate solutions, Assistance required to implement solutions  Behavior: attentive, cooperative, calm, impulsive, inattentive  Motor Planning: intact  Coordination: intact  Hand Dominance: right handed    Musculoskeletal Examination  Right Upper Extremity ROM: within functional limits  Left Upper Extremity ROM: within functional limits  Right Lower Extremity ROM: within functional limits  Left Lower Extremity ROM: within functional limits         Right Upper Extremity Strength: within functional limits  Left Upper Extremity Strength: within functional limits  Right Lower Extremity Strength: within functional limits  Left Lower Extremity Strength: within functional limits      Sensory/Oculomotor Examination  Auditory: intact  Tactile - Light Touch: intact  Visual Acuity: wears glasses      Ocular Range of Motion: Within Functional Limits  Head Position:  Within Defined Limits  Tracking: Able to track stimulus in all quads without difficulty    Activities of Daily Living  Eating: Modified Independent  Grooming: Minimal Assist  Bathing: Minimal Assist  UB Dressing: Modified Independent  LB Dressing: Minimal Assist  Toileting: Minimal Assist    Functional Mobility:  Supine to Sit: Modified Independent  Sit to Supine: Modified Independent  Sit to Stand: Minimal Assist  Functional Mobility/Ambulation: Minimal Assist (with RW; cues for hand/foot placement for safety)    PMP Activity: Step 7 - Walks out of Room    Balance  Static Sitting Balance: good  Dyanamic Sitting Balance: good  Static Standing Balance: good  Dynamic Standing Balance: fair    Participation and Activity Tolerance  Participation Effort: good  Endurance: Tolerates 10 - 20 min exercise with multiple rests    Patient left with call bell within reach, all needs met,   SCD's off (as found)  Floor mat not in place (as found)  Bed alarm on  and all questions answered. RN notified of session outcome and patient response.      Goals:  Time For Goal Achievement: 5 visits  ADL Goals  Patient will groom self: Supervision  Patient will dress lower body: Supervision  Patient will toilet: Supervision  Mobility and Transfer Goals  Pt will perform functional transfers: Supervision        Executive Fucntion Goals  Pt will follow energy conservation techniques: modified independent, with demonstration of 2 ECT while performing ADLs/IADLs, to increase ability to complete ADLs                PPE worn during session: procedural mask and gloves    Tech present: N/A  PPE worn by tech: N/A        Danette Finian Helvey OTR/L  Can be reached via secure chat    Time of Treatment:   OT Received On: 02/20/24  Start Time: 9184  Stop Time: 0840  Time Calculation (min): 25 min

## 2024-02-20 NOTE — Progress Notes (Signed)
 Pt transferred to HARVIN Hoose, patient's sister, was notified.

## 2024-02-20 NOTE — Progress Notes (Signed)
 Attempted EEG per RN patient is restless and not cooperative at the moment, will attempt tomorrow when notified.

## 2024-02-21 ENCOUNTER — Observation Stay

## 2024-02-21 DIAGNOSIS — R55 Syncope and collapse: Secondary | ICD-10-CM

## 2024-02-21 LAB — ECHO ADULT TTE COMPLETE
AV Area (Cont Eq VTI): 2.0858
AV Mean Gradient: 2
AV Peak Velocity: 1.07
Ao Root Diameter (2D): 3.1
BP Mod LV Ejection Fraction: 60
IVS Diastolic Thickness (2D): 0.9
LA Dimension (2D): 2.4
LA Volume Index (BP A-L): 21.584
LVID diastole (2D): 3.4
LVID systole (2D): 2.4
Mitral Valve Findings: NORMAL
Prox Ascending Aorta Diameter: 3.1
Pulmonary Valve Findings: NORMAL
RV Basal Diastolic Dimension: 3.9
RV Systolic Pressure: 21.49
TAPSE: 2.04
Tricuspid Valve Findings: NORMAL

## 2024-02-21 LAB — BASIC METABOLIC PANEL
Anion Gap: 7 (ref 5.0–15.0)
BUN: 10 mg/dL (ref 7–21)
CO2: 24 meq/L (ref 17–29)
Calcium: 8.7 mg/dL (ref 7.9–10.2)
Chloride: 107 meq/L (ref 99–111)
Creatinine: 0.7 mg/dL (ref 0.4–1.0)
GFR: >60 mL/min/1.73 m2 (ref >=60.0)
Glucose: 90 mg/dL (ref 70–100)
Potassium: 3.6 meq/L (ref 3.5–5.3)
Sodium: 138 meq/L (ref 135–145)

## 2024-02-21 LAB — LAB USE ONLY - URINE GRAY CULTURE HOLD TUBE

## 2024-02-21 MED ORDER — NALOXONE HCL 0.4 MG/ML IJ SOLN (WRAP)
0.2000 mg | INTRAMUSCULAR | Status: DC | PRN
Start: 2024-02-21 — End: 2024-02-22

## 2024-02-21 MED ORDER — ATORVASTATIN CALCIUM 10 MG PO TABS
10.0000 mg | ORAL_TABLET | Freq: Every day | ORAL | Status: DC
Start: 2024-02-21 — End: 2024-02-22
  Administered 2024-02-21 – 2024-02-22 (×2): 10 mg via ORAL
  Filled 2024-02-21 (×2): qty 1

## 2024-02-21 MED ORDER — NICOTINE 21 MG/24HR TD PT24
1.0000 | MEDICATED_PATCH | Freq: Every day | TRANSDERMAL | Status: DC
Start: 2024-02-21 — End: 2024-02-22

## 2024-02-21 NOTE — Progress Notes (Cosign Needed)
 *STUDENT NOTE*  **This is for practice purposes only, please see attending provider notes**       Date Time  02/21/24 11:38 AM   Patient Name  Ann Patterson, Ann Patterson   DOB  07/28/1950   MRN  [id number]   Adult Observation Unit  P205/P205.01   Attending Physician  Joesph Alan SAILOR, MD     Primary Care Physician: Din, Anwar U, MD       History of Presenting Illness:   Ann Patterson is a 73 y.o. female has a past medical history of Headache, fall (04/05/2018), Hyperlipidemia, Low back pain, Memory loss (02/07/2011), Meningitis spinal (1956), Neuropathy, Osteoporosis (12/07/2020), Pneumonia (2015), Polio (1956), and Vertigo.     She presents to the hospital with seizure activity at a hotel where she has been staying recently. Per report, bystanders noted generalized diffuse shaking while patient was on the ground. Patient was awake but slow to respond during evaluation by EMS. No tongue bite or incontinence. No seizure activity during transport or on arrival to ED. Patient is poor historian. She states she fell while on the hallway in the hotel and remembers falling, and then had LOC for about a minute. She has past history of LOC. She is not aware of having history of seizure. She denies headache, neck pain, or other pain. She denies SOB, palpitations, abdominal pain, vomiting, diarrhea, dysuria, cough, or fever. She is feeling cold while in ED. She does not know names of her medications except for lyrica . Patient states she rarely drinks alcohol  but had been during the day. Denies drug abuse (past drug screen positive for cannabinoid).       Alcohol  screening was negative. Urine toxicology screen was positive for cannabinoid and cocaine. CT cervical spine and maxillofacial showed no acute process. CT head showed chronic small vessel ischemic changes, but otherwise negative. ECG showed NSR with TW abnormalities. Echo completed 10/29; normal. Awaiting MRI Brain and EEG to be completed.  Potassium upon arrival was  3 and Na was 131. Infused normal saline and 20 mEq potasium infusion for 24 hours.     Patient became restless and wanted to go outside to smoke overnight 10/28. Patient has a 1:1 sitter in place. Haldol IV given once 10/28.    #Seizure like activity: seizure vs convulsive syncope   #Hyponatremia, hypovolumic Resolved  #Hypokalemia, Resolved  - Normal Saline with KCL 20 mEq infusion at 100 ml/hr for 24 hrs; completed  - Replaced potassium with 40 mEq PO  - Awaiting MRI Brain to be completed  - Awaiting EEG  - ECHO: EF 60% with no wall motion abnormalities, No diastolic dysfunction. Negative for PHTN.   - CT head chronic ischemic small vessel changes    #Hyperlipidemia  - Continue PTA med Pravastatin 20 mg at discharge    #Osteoporosis  #Chronic back pain  - Takes Evista  60 mg PO daily; Continue with PTA dose.  - Takes Prolio injections every 6 months at PCP office.     #Mood Disorder  - Takes Lyrica  200 mg PO TID; Continue with PTA dose.  - Follows with [facility].  - 1:1 sitter in place  - COWS evaluation; cocaine and cannabinoids in urine screen    #Smoker  - Nicotine patch offered; she refused due to smell.        Interpreter: no, not indicated      Past Medical History:   Medical History[1]  Available old records reviewed.   Past Surgical History:  Past Surgical History[2]  Family History:   Family History[3]  Social History:   Social History[4]  Allergies:   Allergies[5]  Medications:   Prescriptions Prior to Admission[6]  Review of Systems:   Review of Systems   Constitutional: Negative.  Negative for chills and fever.   HENT: Negative.  Negative for sore throat.    Eyes: Negative.    Respiratory: Negative.  Negative for stridor.    Cardiovascular: Negative.    Gastrointestinal: Negative.  Negative for abdominal pain, blood in stool and melena.   Genitourinary: Negative.    Musculoskeletal:  Positive for falls.   Skin: Negative.  Negative for itching and rash.   Neurological:  Positive for  loss of consciousness.   Endo/Heme/Allergies: Negative.    Psychiatric/Behavioral:  Positive for substance abuse.      All other systems were reviewed and negative  Physical Exam:   BP 127/72   Pulse 68   Temp 98.1 F (36.7 C) (Oral)   Resp 17   Ht 1.549 m (5' 0.98)   Wt 40.1 kg (88 lb 6.5 oz)   SpO2 94%   BMI 16.71 kg/m   Body mass index is 16.71 kg/m.      11/30/2023 02/19/2024 02/20/2024   Weight Monitoring   Height 154.9 cm 154.9 cm 154.9 cm   Height Method  Estimated    Weight 40.37 kg 40.1 kg 40.1 kg   Weight Method  Estimated    BMI (calculated) 16.8 kg/m2 16.7 kg/m2 16.7 kg/m2            Physical Exam  Constitutional:       General: She is not in acute distress.     Appearance: Normal appearance.   HENT:      Head: Atraumatic.   Eyes:      Extraocular Movements: Extraocular movements intact.      Pupils: Pupils are equal, round, and reactive to light.   Cardiovascular:      Rate and Rhythm: Normal rate and regular rhythm.      Heart sounds: Normal heart sounds. No murmur heard.     No friction rub. No gallop.   Pulmonary:      Breath sounds: Normal breath sounds. No wheezing, rhonchi or rales.   Abdominal:      General: Bowel sounds are normal.      Tenderness: There is no abdominal tenderness. There is no guarding.   Musculoskeletal:         General: No swelling. Normal range of motion.   Skin:     General: Skin is warm and dry.   Neurological:      Mental Status: She is alert and oriented to person, place, and time.   Psychiatric:         Mood and Affect: Mood normal.         Behavior: Behavior normal.         Labs/Imaging:   I have Personally Reviewed   Results for orders placed or performed during the hospital encounter of 02/19/24 (from the past 24 hours)   Urine Drugs of Abuse Screen    Collection Time: 02/20/24  4:45 PM   Result Value    Urine Amphetamine Screen Negative    Urine Barbituate Screen Negative    Urine Benzodiazepine Screen Negative    Urine Cannabinoid Screen Positive (A)     Urine Cocaine Screen Positive (A)    Urine Fentanyl  Screen Negative    Urine Opiate Screen Negative  Urine PCP Screen Negative   Urine Elnor Culture Hold Tube    Collection Time: 02/20/24  4:45 PM   Result Value    Extra Tube Hold for add-ons.   Urinalysis with Reflex to Microscopic Exam and Culture    Collection Time: 02/20/24  4:45 PM    Specimen: Urine, Clean Catch   Result Value    Urine Color Straw    Urine Clarity Clear    Urine Specific Gravity 1.011    Urine pH 6.0    Urine Leukocyte Esterase Negative    Urine Nitrite Negative    Urine Protein Negative    Urine Glucose Negative    Urine Ketones Negative    Urine Urobilinogen Normal    Urine Bilirubin Negative    Urine Blood Negative   Basic Metabolic Panel    Collection Time: 02/21/24  3:34 AM   Result Value    Glucose 90    BUN 10    Creatinine 0.7    Calcium 8.7    Sodium 138    Potassium 3.6    Chloride 107    CO2 24    Anion Gap 7.0    GFR >60.0     ECG: NSR with TW abnormalites.   Radiology: No results found.    ASSESSMENT/PLAN:   1. Complete imaging for seizure workup; EEG and MRI pending          DVT ppx: HEPARIN  Code: FULL CODE  Diet: Regular Diet                                                                                Please See attending internal medicine note that follows this mid-level encounter note  Disposition:   Today's date: 02/21/2024  Admit Date: 02/19/2024 10:35 PM  Anticipated medical stability for discharge:Green - definitely tomorrow  Service status: Observation:patient is stable and improving.  Reason for ongoing hospitalization: MRI, EEG  Anticipated discharge needs: TBD      Patient Education/Discharge Instructions:       I have discussed with Attending: Joesph Alan SAILOR, MD  Talayia Hjort, RN, NP Student  Adult Observation Unit Nurse Practitioner  [phone] or [phone] on [phone]         [1]   Past Medical History:  Diagnosis Date    Headache     Hx of fall 04/05/2018    Hyperlipidemia     Low back pain     Memory loss 02/07/2011     Meningitis spinal 1956    spinal and regular    Neuropathy     upper c spine issues, + carpal tunnel symptoms bilaterally    Osteoporosis 12/07/2020    Pneumonia 2015    Polio 1956    Vertigo    [2]   Past Surgical History:  Procedure Laterality Date    APPENDECTOMY (OPEN)      73 years of age    BACK SURGERY  2005    plate in her neck-    CARPAL TUNNEL RELEASE      bilateral hands 1994 and 1996    EXTRACTION, CATARACT, PHACO, IOL Left 02/28/2019    Procedure: EXTRACTION, CATARACT, PHACO, IOL;  Surgeon: Butler Deatrice ORN, MD;  Location: [facility] OR;  Service: Ophthalmology;  Laterality: Left;  LEFT  EYE PHACO WITH IOL    EXTRACTION, CATARACT, PHACO, IOL Right 03/14/2019    Procedure: EXTRACTION, CATARACT, PHACO, IOL;  Surgeon: Butler Deatrice ORN, MD;  Location: [facility];  Service: Ophthalmology;  Laterality: Right;  RIGHT  EYE PHACO WITH IOL    FRACTURE SURGERY Left     at 73 years of age    HYSTERECTOMY      1995    ORIF, FINGER Left 03/02/2015    Procedure: ORIF, FINGER;  Surgeon: Bolling Oh, MD;  Location: [facility] OR;  Service: Plastics;  Laterality: Left;  LEFT SMALL FINGER METACARPAL ORIF    REPAIR, HAND, NERVE  12/10/2012    Procedure: REPAIR, HAND, NERVE;  Surgeon: Bolling Oh, MD;  Location: [facility];  Service: Plastics;  Laterality: Left;  LEFT SMALL FINGER EXPLORATION; REPAIR DIGITAL NERVE    REPAIR, LIGAMENT Left 03/02/2015    Procedure: REPAIR, LIGAMENT;  Surgeon: Mehan, Vineet, MD;  Location: [facility] OR;  Service: Plastics;  Laterality: Left;  LEFT MIDDLE FINGER RADIAL COLLATERAL LIGAMENT REPAIR   [3]   Family History  Problem Relation Name Age of Onset    Myocardial Infarction Mother      Diverticulitis Sister      Diabetes Maternal Grandmother      Brain cancer Maternal Grandfather      Breast cancer Neg Hx     [4]   Social History  Socioeconomic History    Marital status: Single   Tobacco Use    Smoking status: Every Day     Current packs/day: 0.50     Average  packs/day: 0.5 packs/day for 30.0 years (15.0 ttl pk-yrs)     Types: Cigarettes    Smokeless tobacco: Never    Tobacco comments:     5-6 cigarettes per day    Vaping Use    Vaping status: Never Used   Substance and Sexual Activity    Alcohol  use: Yes     Comment: occasional, shots of corazon on special occasions    Drug use: Not Currently     Frequency: 2.0 times per week     Types: Marijuana     Comment: uses fro pain rellief  will hold use 02/28/15     Social Drivers of Health     Food Insecurity: No Food Insecurity (02/20/2024)    Hunger Vital Sign     Worried About Running Out of Food in the Last Year: Never true     Ran Out of Food in the Last Year: Never true   Recent Concern: Food Insecurity - Food Insecurity Present (11/29/2023)    Hunger Vital Sign     Worried About Programme Researcher, Broadcasting/film/video in the Last Year: Often true     Ran Out of Food in the Last Year: Often true   Transportation Needs: Unmet Transportation Needs (02/20/2024)    PRAPARE - Therapist, Art (Medical): Yes     Lack of Transportation (Non-Medical): Yes   Intimate Partner Violence: Not At Risk (02/20/2024)    Humiliation, Afraid, Rape, and Kick questionnaire     Fear of Current or Ex-Partner: No     Emotionally Abused: No     Physically Abused: No     Sexually Abused: No   Housing Stability: Not At Risk (02/20/2024)    Housing Stability NCSS  Do you have housing?: Yes     Are you worried about losing your housing?: No   Recent Concern: Housing Stability - At Risk (11/29/2023)    Housing Stability NCSS     Do you have housing?: No     Are you worried about losing your housing?: Yes   [5]   Allergies  Allergen Reactions    Gadolinium      Heart fluttering    Iodine      Heart fluttering    Penicillins Hives   [6]   Medications Prior to Admission   Medication Sig Dispense Refill Last Dose/Taking    fluticasone (FLONASE) 50 MCG/ACT nasal spray APPLY ONE SPRAY IN EACH NOSTRIL EVERY DAY   02/19/2024    oxybutynin XL (DITROPAN-XL)  5 MG 24 hr tablet Take 1 tablet (5 mg) by mouth once daily   02/19/2024    pregabalin  (LYRICA ) 200 MG capsule Take 1 capsule (200 mg) by mouth 3 (three) times daily 30 capsule 4 02/19/2024    raloxifene  (EVISTA ) 60 MG tablet Take 1 tablet (60 mg total) by mouth daily. 30 tablet 3 02/19/2024    ciclopirox (PENLAC) 8 % solution APPLY TO AFFECTED NAILS NIGHTLY UNTIL CONDITION IMPROVES AS DIRECTED   Unknown    denosumab  (PROLIA ) 60 MG/ML Solution Prefilled Syringe subcutaneous injection INJECT 60 MG INTO THE SKIN ONCE FOR 1 DOSE EVERY 6 MONTHS 1 mL 1 Unknown    escitalopram  (LEXAPRO ) 10 MG tablet Take 1 tablet (10 mg) by mouth once daily For depression 30 tablet 1     pravastatin (PRAVACHOL) 20 MG tablet Take 1 tablet (20 mg) by mouth once at bedtime   Unknown    sodium chloride  (MURO 128) 5 % ophthalmic solution 1 drop as needed       vitamin D , ergocalciferol , (DRISDOL ) 50000 UNIT Cap Take 1 capsule (50,000 Units) by mouth once a week 12 capsule 3

## 2024-02-21 NOTE — Plan of Care (Signed)
 Adult Observation Progress Note    Shift Note:    General: Patient VSS, in no apparent distress at this time. Seizure precautions in place. 1:1 sitter at bedside. COWS score: 0.  Neuro: Patient is Aox3 (disoriented to time). Patient denies numbness or tingling. Perrla.   Cardio: Not on telemetry, verified with [facility]. S1, S2 auscultated.   Resp: Patient on RA with lung sounds clear bilaterally on auscultation.   Integ: Scattered bruising. Dry skin.  MSK: Patient ambulates: independently with: standby assist. low/mod/high: High falls risk.   GI: Bowel sounds auscultated all 4 quadrants. Last BM 10/28.   GU: Patient is continent of urine.   IV Access: IV: 18g  in: RAC.     No significant events or provider communication. Patient denies pain at this time, and is resting comfortably in bed.    BM during shift: YES/NO: no    Pending Orders:   - MRI brain results  - EEG  - 1:1 sitter  - COWS  - PT/OT    Discharge Plan: TBD, pending medical clearance    Social/Family Visits: N/A    POC update: pt updated with POC during bedside report    Problem: Moderate/High Fall Risk Score >5  Goal: Patient will remain free of falls  Outcome: Progressing  Flowsheets (Taken 02/21/2024 1957)  High (Greater than 13):   HIGH-Visual cue at entrance to patient's room   HIGH-Bed alarm on at all times while patient in bed   HIGH-Initiate use of floor mats as appropriate   HIGH-Consider use of low bed     Problem: Compromised Sensory Perception  Goal: Sensory Perception Interventions  Outcome: Progressing  Flowsheets (Taken 02/20/2024 0920 by Kim Maillard, RN)  Sensory Perception Interventions: Offload heels, Pad bony prominences, Reposition q 2hrs/turn Clock, Q2 hour skin assessment under devices if present     Problem: Compromised Moisture  Goal: Moisture level Interventions  Outcome: Progressing  Flowsheets (Taken 02/20/2024 0920 by Kim Maillard, RN)  Moisture level Interventions: Moisture wicking products, Moisture barrier cream      Problem: Compromised Activity/Mobility  Goal: Activity/Mobility Interventions  Outcome: Progressing  Flowsheets (Taken 02/20/2024 0920 by Kim Maillard, RN)  Activity/Mobility Interventions: Pad bony prominences, TAP Seated positioning system when OOB, Promote PMP, Reposition q 2 hrs / turn clock, Offload heels     Problem: Neurological Deficit  Goal: Neurological status is stable or improving  Outcome: Progressing  Flowsheets (Taken 02/20/2024 2012 by Veneda Leek, RN)  Neurological status is stable or improving:   Observe for seizure activity and initiate seizure precautions if indicated   Perform CAM Assessment     Problem: Potential for Aspiration  Goal: Risk of aspiration will be minimized  Outcome: Progressing     Problem: Peripheral Neurovascular Impairment  Goal: Extremity color, movement, sensation are maintained or improved  Outcome: Progressing     Problem: Impaired Mobility  Goal: Mobility/Activity is maintained at optimal level for patient  Outcome: Progressing  Flowsheets (Taken 02/20/2024 2012 by Veneda Leek, RN)  Mobility/activity is maintained at optimal level for patient:   Encourage independent activity per ability   Consult/collaborate with Physical Therapy and/or Occupational Therapy     Problem: Communication Impairment  Goal: Will be able to express needs and understand communication  Outcome: Progressing  Flowsheets (Taken 02/20/2024 2012 by Veneda Leek, RN)  Able to express needs and understand communication:   Provide alternative method of communication if needed   Consult/collaborate with Case Management/Social Work   Include patient care companion  in decisions related to communication   Patient/patient care companion demonstrates understanding on disease process, treatment plan, medications and discharge plan

## 2024-02-21 NOTE — Progress Notes (Signed)
 Quick Doc  [facility] PROFESSIONAL SERVICES BUILDING OBSERVATION   Patient Name: Ann Patterson   Attending Physician: Joesph Alan SAILOR, MD   Today's date:   02/21/2024 LOS: 0 days   Expected Discharge Date      Quick  Assessment:                                                              Readmission Risk Score: 13.2    CM made contact with patient via Caregility, sitters are in the room.  CM introduced self, discussed role and attempted to confirm info on face sheet.  Patient reported address on facesheet is her [address], and she does not know her phone number.  Patient reported she was living in a room with a friend, believes the state placed her there.  Patient provided verbal consent for CM to speak with her friend, Shasta, on the facesheet however no answer and CM left a voicemail.  Patient reporting she does not know info or not forthcoming with info.  Patient contacted [facility] was informed patient is not registered.  CM reviewed the EMS report and contacted [facility] [address] [place] [phone].  CM spoke with Camellia at front desk who reported patient is a resident, [name] shares the room and pays the bill. He reported patient is able to return.  CM transferred to x [phone] but no answer.     _______________  Discussed in MDR this afternoon.  Discharge pending EEG and MRI.                                                                                   Provider Notifications:

## 2024-02-21 NOTE — Progress Notes (Signed)
 02/21/24 1314   Case Management Quick Doc   Acknowledgment of Outpatient/Observation Observation letter given   CMA Tasks   CMA tasks MOON delivered     Unable to obtain at this time.    CMS attempted room phone, then called pt's cell twice (unable to complete call).  Copy of letter available in mychart for pt to review.

## 2024-02-21 NOTE — Consults (Signed)
Please see CM note

## 2024-02-21 NOTE — Progress Notes (Signed)
 Attending Attestation:     I have seen and examined the patient with the APP. I have discussed the plan with APP Terese, full note to follow. I have performed the substantive Medical Decision-Making portion of this encounter.     I have personally performed the substantive portion of this visit by conducting a face to face encounter, ordering and reviewing labs/imaging studies, discussing plan of care with consultants and other care team members, and performing medical decision making. A total of 53 minutes was spent.        Assessment and Plan:  #Seizure like activity - possibly convulsive syncope, seizure less likely   #electrolyte abnormalities, POA, hypokalemia and hyponatremia  #Nicotine use  #Alcohol  use disorder  #Substance use disorder - utox positive for cannabinoid and cocaine  #H/o polio  #Osteoporosis  #Underweight by BMI of 16.71  - MRI brain pending  - echo pending  - EEG pending  - add nicotine patch  - add COWS    DVT ppx: heparin (porcine) injection 5,000 Units  Place sequential compression device      Bedside trio rounds with APP, RN and patient.     Alan Search, MD  Associate Medical Director, [facility]   Assistant Professor of Medical Education, [facility]  Internal Medicine Hospitalist  Department of Medicine   [facility]  [address]  [place] [address]  T [phone] P [phone]         [facility]      Please pardon any potential grammatical errors or typos as aspects of this note may have been created through text-to-speech software.

## 2024-02-21 NOTE — PT Progress Note (Signed)
 [facility]   Physical Therapy Cancellation Note      Patient:  Ann Patterson MRN#:  [id number]  Unit:  [facility] PROFESSIONAL SERVICES BUILDING OBSERVATION Room/Bed:  P205/P205.01    02/21/2024  Time: 1:24 PM     2nd attempt:  PT Cancellation: Visit  PT Visit Cancellation Reason: Patient/caregiver declines therapy at this time   Pt sleeping in bed. Wakes to stimulus. Despite encouragement, pt declines to participate, stating I'm sleeping now. If I try to get up I'll probably fall to the floor. Come back later. When advised provider would not be able to return at a later time this date, pt stated that's fine.  Will cont to follow, retry as able.     Cyrah Mclamb, MSPT

## 2024-02-21 NOTE — Progress Notes (Signed)
 CNS PROGRESS NOTE    Date Time: 02/21/24 7:05 AM  Patient Name: Ann Patterson  Attending Physician: Joesph Alan SAILOR, MD    CC:   Chief Complaint   Patient presents with    Seizures    Fall       Assessment:   Hospital Course  Ann Patterson is a 73 y.o. female with a PMHx of polio, tobacco use, HLD, osteoporosis, chronic back pain, neuropathy, memory loss, admitted 02/19/2024 to observation for seizure activity with diffuse generalized shaking witnessed by bystanders at a hotel where she has been staying recently. Per HPI, Patient was awake but slow to respond during evaluation by EMS. No tongue bite or incontinence. No seizure activity during transport or on arrival to ED. Patient is poor historian. She states she fell while on the hallway in the hotel and remembers falling, and then had LOC for about a minute. She has past history of LOC. She is not aware of having history of seizure.    On ED arrival, VSS.  EKG showed NSR.  Labs notable for: WBC 11.02, Na 131, K 3.0, hs trop 5.9, negative BAL.  CT head showed no acute abnormality.  CT maxillofacial showed no acute facial fractures.  CT C-spine showed postop and degenerative changes, no acute osseous injury.  She was admitted to observation.  She was started on NS with KCl.  PT/OT evaluated and recommended SNF.  UA was negative.  UDS positive for cannabinoids and cocaine.  Initial attempt to complete EEG unsuccessful as patient was restless and not cooperating.  Evening after admission, patient refusing IV fluids and medications, repeatedly asking to go outside to smoke a cigarette.  She was given IV haldol 1 mg for agitation.  Orthostatic vitals were negative.  Echo showed EF 60%, normal LV diastolic function, normal RV systolic function, mild AR, no pHTN.  Awaiting MRI brain and EEG.      Interval History:  10/29: Agitated overnight, received IV haldol x1. Awaiting MRI brain, EEG, echo  Plan:   # Seizure-like activity, possible convulsive  syncope vs less likely seizure  # Leukocytosis, suspect reactive  # Hyponatremia, resolved  # Hypokalemia, resolved  # Cocaine use, positive urine tox screen  # Cannabinoid use, positive urine tox screen  # Chronic tobacco use  - awaiting MRI brain  - awaiting routine EEG  - 1:1 sitter  - started on COWS protocol in case of withdrawal given positive urine tox  - added nicotine patch --> refusing d/t smell  - [place] IVF  - continue PRN haldol for agitation  - seizure precautions  - PT/OT eval --> pt declined to work w/ PT this morning    Chronic:  # History of polio  # HLD  # Osteoporosis  # Chronic back pain  # Neuropathy  # Memory loss  # Underweight, BMI 16.71  - continue home oxybutynin, lyrica , evista   - resumed home statin  - taking Vit D 5K units qSunday + Prolia  injections q6 mos as OP         Interpreter: no, not indicated     Code Status: FULL CODE    Last BM: Last BM Date: 02/20/24    Mobility: PMP Activity: Step 7 - Walks out of Room (02/20/2024  8:00 PM)    VTE Prophylaxis: heparin (porcine) injection 5,000 Units  Place sequential compression device     Foley Catheter: No Foley Present    Venous Access: No Temporary Central Line Present  Medical Readiness for Discharge: Anticipated Tomorrow      Time spent coordinating care:38 minutes    Please see attending note that follows this mid-level encounter note.   Review of Systems:   Review of Systems - Negative except noted in HPI  ROS  Physical Exam:   Physical Exam  Vitals reviewed.   Constitutional:       General: She is not in acute distress.     Appearance: She is not toxic-appearing.   HENT:      Head: Normocephalic and atraumatic.   Eyes:      Extraocular Movements: Extraocular movements intact.      Conjunctiva/sclera: Conjunctivae normal.   Cardiovascular:      Rate and Rhythm: Normal rate and regular rhythm.   Pulmonary:      Effort: Pulmonary effort is normal. No respiratory distress.      Breath sounds: Normal breath sounds.   Abdominal:       General: Bowel sounds are normal. There is no distension.      Palpations: Abdomen is soft.      Tenderness: There is no abdominal tenderness. There is no guarding.   Musculoskeletal:         General: No swelling.   Skin:     General: Skin is warm and dry.   Neurological:      General: No focal deficit present.      Mental Status: She is alert.      Sensory: No sensory deficit.      Motor: No weakness.   Psychiatric:         Attention and Perception: Attention normal.         Speech: Speech normal.         Behavior: Behavior is cooperative.         Judgment: Judgment is impulsive.       VITAL SIGNS PHYSICAL EXAM   Temp:  [97.4 F (36.3 C)-98.3 F (36.8 C)] 97.4 F (36.3 C)  Heart Rate:  [64-95] 80  Resp Rate:  [15-18] 15  BP: (114-133)/(70-87) 128/82  Blood Glucose:      No intake or output data in the 24 hours ending 02/21/24 0705        Meds:   Current Facility-Administered Medications[1]  Labs:     Recent Labs     02/19/24  2311   WBC 11.02*   Hemoglobin 13.7   Hematocrit 39.3   Platelet Count 251   MCV 90.1     Recent Labs     02/21/24  0334 02/20/24  0919 02/19/24  2311   Sodium 138 141 131*   Potassium 3.6 3.4* 3.0*   Chloride 107 104 93*   CO2 24 28 25    BUN 10 10 14    Creatinine 0.7 0.8 0.9   Glucose 90 97 114*   Calcium 8.7 9.4 9.6   Magnesium   --  2.2 2.1     Recent Labs     02/19/24  2311   AST (SGOT) 35   ALT 12   Alkaline Phosphatase 65   Protein, Total 8.3   Albumin 4.1     Recent Labs     02/19/24  2311   PTT 28   PT 11.5   INR 1.0     Imaging personally reviewed.         Today's date: 02/21/2024  Admit Date: 02/19/2024 10:35 PM    I have discussed with Attending: Joesph Alan SAILOR, MD  Signed by: Asberry LITTIE Provost, FNP  Adult Observation Unit Nurse Practitioner    I have Reviewed the interval history, images and pertinent test results and personally examined the patient and confirmed the major physical findings of the preceding mid-levels note. Agree with above         [1]   Current  Facility-Administered Medications   Medication Dose Route Frequency    heparin (porcine)  5,000 Units Subcutaneous Q12H SCH    oxybutynin XL  5 mg Oral Daily    potassium chloride  40 mEq Oral Once    pregabalin   200 mg Oral TID    raloxifene   60 mg Oral Daily

## 2024-02-21 NOTE — Plan of Care (Signed)
 Problem: Moderate/High Fall Risk Score >5  Goal: Patient will remain free of falls  Outcome: Progressing     Problem: Compromised Sensory Perception  Goal: Sensory Perception Interventions  Outcome: Progressing     Problem: Compromised Moisture  Goal: Moisture level Interventions  Outcome: Progressing     Problem: Compromised Activity/Mobility  Goal: Activity/Mobility Interventions  Outcome: Progressing     Problem: Neurological Deficit  Goal: Neurological status is stable or improving  Outcome: Progressing     Problem: Potential for Aspiration  Goal: Risk of aspiration will be minimized  Outcome: Progressing     Problem: Peripheral Neurovascular Impairment  Goal: Extremity color, movement, sensation are maintained or improved  Outcome: Progressing     Problem: Impaired Mobility  Goal: Mobility/Activity is maintained at optimal level for patient  Outcome: Progressing     Problem: Communication Impairment  Goal: Will be able to express needs and understand communication  Outcome: Progressing

## 2024-02-21 NOTE — PT Progress Note (Signed)
 [facility]   Physical Therapy Cancellation Note      Patient:  Ann Patterson MRN#:  [id number]  Unit:  [facility] PROFESSIONAL SERVICES BUILDING OBSERVATION Room/Bed:  P205/P205.01    02/21/2024  Time: 8:25 AM       PT Cancellation: Visit  PT Visit Cancellation Reason: Testing/Procedure (echo at bedside. Will retry as able.)       Talen Poser, MSPT

## 2024-02-21 NOTE — Progress Notes (Signed)
 Contacted nurse regarding routine EEG, patient leaving for MRI soon will be updated later per nurse.

## 2024-02-21 NOTE — Consults (Signed)
 Wound Ostomy Continence Virtual Consultation    Date Time: 02/21/24 2:49 PM  Patient Name: Ann Patterson, Ann Patterson  Consulting Service: [facility] Day: 3     Reason for Consult   Consult received regarding: red spots patterns in both arms, pt reports these was blisters then if those popped would turn into bumpy skin texture (right arm)     Assessment & Plan   Assessment:   WOC Consult was completed virtually utilizing the photography included by primary bedside staff, located in Chart Review under the Media tab.     Location:  BUE  Wound description: Scattered ecchymosis and fragile, dry skin  Periwound: RUE edema  Consistent with/Etiology: No active wounds  Wound Photography:               Plan/Follow-Up:     Recommend use of Prevent silicone moisturizing cream. Dermatological conditions outside WOC scope of practice    History of Present Illness   This is a 73 y.o. female  has a past medical history of Headache, fall (04/05/2018), Hyperlipidemia, Low back pain, Memory loss (02/07/2011), Meningitis spinal (1956), Neuropathy, Osteoporosis (12/07/2020), Pneumonia (2015), Polio (1956), and Vertigo..  Admitted with Seizure-like activity (CMS/HCC).      Tacoma Merida BSN, RN, [employer], CMSRN  Spectra : [phone]  WOC Office: [phone]  Secure Chat: [facility] Wound Ostomy Coordinators - Inpatient

## 2024-02-21 NOTE — Progress Notes (Addendum)
 Adult Observation Progress Note    Shift Note:    General: Patient VSS, in no apparent distress at this time.  Neuro: Patient is AOx4. Patient denies numbness or tingling.   Cardio: Not on telemetry, denies chest pain  Resp: Patient on RA with lung sounds clear bilaterally on auscultation.   Integ: skin red rashes/patches scattered  MSK: Patient ambulates: independently with: standby assist. low/mod/high: High falls risk.   GI: Bowel sounds auscultated all 4 quadrants.   GU: Patient is continent of urine.   IV Access: IV: 18g  in: RAC.     No significant events or provider communication. Patient denies pain at this time, and is resting comfortably in bed.    --Pt refuses prescribed nicotine patch, stating dislike the smell.    BM during shift: YES/NO: no    Plan:  MRI brain WO contrast resulted  EEG pending  Electrolyte  Tele - completed  1:1 sitter  COWS  PT/OT - pt refusing to work with PT  CM following for safe dispo  ECHO resulted    Discharge Plan: To bd, pending medical clearance    Social/Family Visits: sister Audria) called    POC update: pt updated with POC during bedside report      Vitals:    02/20/24 2000 02/20/24 2302 02/21/24 0328 02/21/24 0739   BP:  114/81 128/82 116/70   Pulse:  85 80 (!) 56   Resp:  17 15 18    Temp:  97.6 F (36.4 C) 97.4 F (36.3 C) 98.4 F (36.9 C)   TempSrc:  Oral Oral Oral   SpO2:  95% 97% 97%   Weight: 40.1 kg (88 lb 6.5 oz)      Height: 1.549 m (5' 0.98)          Patient Lines/Drains/Airways Status       Active Lines, Drains and Airways       Name Placement date Placement time Site Days    Peripheral IV 02/19/24 18 G Standard Right Antecubital 02/19/24  2306  Antecubital  1

## 2024-02-21 NOTE — Discharge Instr - AVS First Page (Addendum)
 Reason for your Hospital Admission:  ***      Instructions for after your discharge:  ***

## 2024-02-22 DIAGNOSIS — R55 Syncope and collapse: Secondary | ICD-10-CM

## 2024-02-22 DIAGNOSIS — R4182 Altered mental status, unspecified: Secondary | ICD-10-CM

## 2024-02-22 DIAGNOSIS — R569 Unspecified convulsions: Principal | ICD-10-CM

## 2024-02-22 NOTE — Consults (Signed)
 Home Health Referral    Referral from (Case Manager) for home health care upon discharge.    By [employer] systems, the patient has the right to freely choose a home care provider.    A company of the patients choosing. We have supplied the patient with a listing of providers in your area who asked to be included and participate in Medicare.   [facility] a home care agency that provides adult home care services and participates in Medicare   The preferred provider of your insurance company. Choosing a home care provider other than your insurance company's preferred provider may affect your insurance coverage.      Home Health Discharge Information    Your doctor has ordered DME in-home service(s) for you while you recuperate at home, to assist you in the transition from hospital to home.        The [employer] Company:  [employer]  Ph: [phone]  Equipment Ordered: Vannie        The above services were set up by:  Arneshia Ade (Post Acute Care Coordinator)   Phone:      IF YOU HAVE NOT HEARD FROM YOUR HOME HEALTH AGENCY WITHIN 24-48 HOURS AFTER DISCHARGE PLEASE CALL YOUR AGENCY TO ARRANGE A TIME FOR YOUR FIRST VISIT. FOR ANY SCHEDULING CONCERNS OR QUESTIONS RELATED TO HOME HEALTH, SUCH AS TIME OR DATE PLEASE CONTACT YOUR HOME HEALTH AGENCY AT THE NUMBER LISTED ABOVE.

## 2024-02-22 NOTE — Discharge Summary (Signed)
 [facility]  Internal Medicine Hospitalists  Discharge Summary          CNS PROGRESS NOTE/DISCHARGE SUMMARY  CONDITIONAL DISCHARGE: No. Pt is medically ready for discharge        Date Time: 02/22/24 3:48 PM  Patient Name: Ann Patterson  Attending Physician: Joesph Alan SAILOR, MD   Primary Care Physician: Din, Anwar U, MD    Date of Admission: 02/19/2024  Date of Discharge: 02/22/24    Attending Attestation:     I have seen and examined the patient with the APP. I have discussed the plan with APP Barefoot and have edited this note where necessary.  I have performed the substantive Medical Decision-Making portion of this encounter.     I have personally performed the substantive portion of this visit by conducting a face to face encounter, ordering and reviewing labs/imaging studies, discussing plan of care with consultants and other care team members, and performing medical decision making. Discharge planning completed in 45 minutes.     Assessment/Plan  #Seizure like activity - possibly convulsive syncope vs seizure (EEG without seizure activity)  #electrolyte abnormalities, POA, hypokalemia and hyponatremia  #Nicotine use  #Alcohol  use disorder  #Substance use disorder - utox positive for cannabinoid and cocaine  #H/o polio  #Osteoporosis  #Underweight by BMI of 16.71      Alan Joesph, MD  Associate Medical Director, [facility] Medicine   Assistant Professor of Medical Education, [facility]  Internal Medicine Hospitalist  Department of Medicine   [facility]  [address]  [place] [address]  T [phone] P [phone]         [facility] for        Discharge Dx:   # Possible Seizure activity, suspect convulsive syncope vs substance provoked seizure  # Leukocytosis, suspect reactive  # Hyponatremia, resolved  # Hypokalemia, resolved  # Cocaine use, positive urine tox screen  # Cannabinoid use, positive urine tox screen  # Chronic tobacco use  #  HLD  # Osteoporosis  # Chronic back pain  # Neuropathy  # Memory loss  # History of polio  # Underweight, BMI 16.71    Disposition: Return to [facility] where patient currently resides, CM coordinated transport      Discharge MEDICATIONS        Medication List        CONTINUE taking these medications      ciclopirox 8 % solution  Commonly known as: PENLAC     escitalopram  10 MG tablet  Commonly known as: LEXAPRO   Take 1 tablet (10 mg) by mouth once daily For depression     fluticasone 50 MCG/ACT nasal spray  Commonly known as: FLONASE     oxybutynin XL 5 MG 24 hr tablet  Commonly known as: DITROPAN-XL     pravastatin 20 MG tablet  Commonly known as: PRAVACHOL     pregabalin  200 MG capsule  Commonly known as: LYRICA   Take 1 capsule (200 mg) by mouth 3 (three) times daily     Prolia  60 MG/ML Sosy subcutaneous injection  Generic drug: denosumab   INJECT 60 MG INTO THE SKIN ONCE FOR 1 DOSE EVERY 6 MONTHS     raloxifene  60 MG tablet  Commonly known as: EVISTA   Take 1 tablet (60 mg total) by mouth daily.     sodium chloride  5 % ophthalmic solution  Commonly known as: MURO 128     vitamin D  (ergocalciferol )  50000 UNIT Caps  Commonly known as: DRISDOL   Take 1 capsule (50,000 Units) by mouth once a week              Consultations:   Treatment Team:   Joesph Alan SAILOR, MD  Barefoot, Reche HERO, FNP  Vo, Nhi, RN  Tamea Verneita BIRCH, LPN  Ann Lamprey, RN  Siddique, Piggott, [place]  Rosalinda Morene BRAVO, FNP  Azarion, Peggye LABOR, MD  Ulysess Rake, MD  Wash Oza PARAS, NP  Maranda Altamese PARAS, PA   Recent Labs:   Recent Labs   Lab 02/19/24  2311   WBC 11.02*   Hemoglobin 13.7   Hematocrit 39.3   Platelet Count 251     Recent Labs   Lab 02/21/24  0334   Sodium 138   Potassium 3.6   Chloride 107   CO2 24   BUN 10   Creatinine 0.7   GFR >60.0   Glucose 90   Calcium 8.7         Recent Labs   Lab 02/20/24  0919   TSH 1.03      No results found for: T4. Recent Labs   Lab 02/19/24  2311   hs Troponin 5.9                 No results found for  this or any previous visit (from the past 360 hours).  Procedures/Radiology performed:   MRI Brain WO Contrast  Result Date: 02/21/2024   1.No acute infarct, hemorrhage, hydrocephalus, or mass effect. 2.FLAIR signal appears subtly greater along the right hippocampus/mesial temporal region compared to the left. This may just be artifactual. Recent seizure activity could also cause this appearance. An underlying underlying process such as encephalitis or mesial temporal sclerosis (although without marked asymmetry in size of the hippocampi) would be difficult to exclude in the appropriate clinical setting. Images are variably degraded by motion. Coronal T2-weighted images through the temporal lobes are particularly affected, which limits evaluation. Consider follow-up MRI when the patient is better able to cooperate or with sedation if there is ongoing clinical concern. 3.Patchy chronic small vessel ischemic changes and generalized brain volume loss. Lynwood Artist Gee, MD 02/21/2024 8:48 PM    CT Head without Contrast  Result Date: 02/19/2024  1.Mild chronic small vessel ischemic disease. 2.No acute intracranial hemorrhage or acute cranial fractures. 3.No acute facial fractures are seen. Krzysztof M. Bochenek, MD 02/19/2024 11:38 PM    CT Face/Maxillofacial Bones (Trauma)  Result Date: 02/19/2024  1.Mild chronic small vessel ischemic disease. 2.No acute intracranial hemorrhage or acute cranial fractures. 3.No acute facial fractures are seen. Krzysztof M. Bochenek, MD 02/19/2024 11:38 PM    CT Cervical Spine without Contrast  Result Date: 02/19/2024   1.Postoperative and degenerative changes. 2.No CT evidence of an acute osseous injury to the cervical spine. Krzysztof M. Bochenek, MD 02/19/2024 11:36 PM          ECHOCARDIOGRAM   Echo Results       Procedure Component Value Units Date/Time    Echo Adult TTE Complete [[id number]] Collected: 02/21/24 0825     Updated: 02/21/24 0949     LA Volume Index (BP A-L)  [id number]     No Prior TTE True     IVS Diastolic Thickness (2D) 0.9     LVID diastole (2D) 3.4     LVID systole (2D) 2.4     BP Mod LV Ejection Fraction 60     LA Dimension (2D) 2.4  AV Mean Gradient 2     AV Peak Velocity 1.07     AV Area (Cont Eq VTI) [id number]     Ao Root Diameter (2D) 3.1     Prox Ascending Aorta Diameter 3.1     RV Basal Diastolic Dimension 3.9     RV Systolic Pressure 21.49     TAPSE 2.04     Aortic Valve Findings The aortic valve is tricuspid.     Aortic Valve Findings There is mild aortic regurgitation.     Aortic Valve Findings There is no aortic stenosis with a peak velocity of  1.1 m/s, mean gradient of  2 mmHg, and aortic valve area of  2.1 cm.     Pulmonary Valve Findings The pulmonic valve is structurally normal.     Pulmonary Valve Findings There is mild pulmonic regurgitation.     Mitral Valve Findings The mitral valve is structurally normal.     Mitral Valve Findings There is trace mitral regurgitation.     Mitral Valve Findings There is mild mitral annular calcification.     Tricuspid Valve Findings The tricuspid valve is structurally normal.     Tricuspid Valve Findings There is mild tricuspid regurgitation.     Tricuspid Valve Findings No pulmonary hypertension with estimated right ventricular systolic pressure of  21 mmHg.     Summary --     ECHO ADULT TTE COMPLETE   Date: 02/21/2024   Normal atria and aorta.   No prior study is available for comparison.   Left ventricular systolic function is normal with an ejection fraction by Biplane Method of Discs of  60 %.   There is normal left ventricular diastolic function.   Normal right ventricular systolic function.   There is mild aortic regurgitation.   No pulmonary hypertension with estimated right ventricular systolic pressure of  21 mmHg.         Narrative:      Name:     Daneli Butkiewicz  Age:     85 years  DOB:     01-27-1951  Gender:     Female  MRN:     [id number]  Wt:     88 lb  BSA:     1.30  m2  Systolic BP:     871 mmHg  Diastolic BP:     82 mmHg  Technical Quality:     Adequate  Exam Date/Time:     02/21/2024 8:25 AM  Study Site:     [facility]  Exam Type:     ECHO ADULT TTE COMPLETE    Study Info  Indications      Syncope with Canadian Score >2 -  Procedure    Complete two-dimensional, color flow and spectral Doppler transthoracic  echocardiogram is performed.    Reading Physician:     Selinda DELENA Mayans MD PHD    Staff  Sonographer:     Aron Rowan RDCS  Ordering Provider:     Justus JULIANNA Most    61 in      History/Risk Factors  Hyperlipidemia:     Yes    Prior Cardiac Studies  No Prior Echo Available.     --      Summary    * Left ventricular systolic function is normal with an ejection fraction by  Biplane Method of Discs of  60 %.    * There is normal left ventricular diastolic function.    * Normal right ventricular systolic  function.    * Normal atria and aorta.    * There is mild aortic regurgitation.    * No pulmonary hypertension with estimated right ventricular systolic  pressure of  21 mmHg.    * No prior study is available for comparison.      Findings  Left Ventricle    The left ventricle is small. There is concentric left ventricular  remodeling. Left ventricular systolic function is normal with an ejection  fraction by Biplane Method of Discs of  60 %. Left ventricular segmental wall  motion is normal. There is normal left ventricular diastolic function.  Right Ventricle    The right ventricular cavity size is normal in size. Normal right  ventricular systolic function. TAPSE = 2.0 cm, S' = 10 cm/s, fractional area  change = 36 %.      Left Atrium    The left atrium is normal in size.    Right Atrium    The right atrium is normal in size.      Aortic Valve    The aortic valve is tricuspid. There is no aortic stenosis with a peak  velocity of  1.1 m/s, mean gradient of  2 mmHg, and aortic valve area of  2.1  cm2. There is mild aortic regurgitation.    Pulmonary Valve    The pulmonic valve is  structurally normal. There is mild pulmonic  regurgitation.    Mitral Valve    The mitral valve is structurally normal. There is trace mitral  regurgitation. There is mild mitral annular calcification.    Tricuspid Valve    The tricuspid valve is structurally normal. There is mild tricuspid  regurgitation. No pulmonary hypertension with estimated right ventricular  systolic pressure of  21 mmHg.      Aorta    The aortic root is normal in size at 3.1 cm in diameter. The ascending aorta  is normal in size at 3.1 cm in diameter.    Inferior Vena Cava    The IVC is normal in size with > 50% respiratory variance consistent with  normal RA pressure of 3 mmHg.    Pericardium / Pleural Effusion    No pericardial effusion visualized.      Measurements  2D Measurements  ----------------------------------------------------------------------  Name                                 Value        Normal  ----------------------------------------------------------------------    Parasternal 2D  ----------------------------------------------------------------------   IVS Diastolic Thickness  (2D)                               0.90 cm     0.60-0.90 LVID Diastole (2D)                 3.40 cm     3.80-5.20  LVIW Diastolic Thickness  (2D)                               0.90 cm     0.60-0.95 LVID Systole (2D)                  2.40 cm     2.20-3.50 LVOT Diameter  1.80 cm               LA Dimension (2D)                  2.40 cm     2.70-3.80 Ao Root Diameter (2D)              3.10 cm     2.70-3.70 Prox Asc Ao Diameter               3.10 cm     2.30-3.10     LV Ejection Fraction 2D  ----------------------------------------------------------------------  LV EF (BP MOD)                        60 %         54-74 Apical 2D Dimensions  ----------------------------------------------------------------------   RV Basal Diastolic  Dimension                          3.90 cm     2.50-4.10 LA Volume Index (BP A-L)        21.58 ml/m2    16.00-34.00 RA Area (4C)                     13.60 cm2       <=18.00  M-mode Measurements  ----------------------------------------------------------------------  Name                                 Value        Normal  ----------------------------------------------------------------------    M-Mode  ----------------------------------------------------------------------  TAPSE                              2.04 cm        >=1.60  LVOT/Aortic Valve Doppler Measurements  ----------------------------------------------------------------------  Name                                 Value        Normal  ----------------------------------------------------------------------    LVOT Doppler  ----------------------------------------------------------------------  LVOT Peak Velocity                0.93 m/s               AoV Doppler  ----------------------------------------------------------------------  AV Peak Velocity                  1.07 m/s               AV Peak Gradient                    5 mmHg               AV Mean Gradient                    2 mmHg           <20 AV Area Index (Cont Eq Vel)     1.7 cm2/m2               AV Area (Cont Eq VTI)             2.09 cm2        >=3.00 AV Area Index (Cont Eq VTI)  1.60 cm2/m2               AV V1/V2 Ratio                        0.87  RVOT/Pulmonic Valve Doppler Measurements  ----------------------------------------------------------------------  Name                                 Value        Normal  ----------------------------------------------------------------------    PV Doppler  ----------------------------------------------------------------------  PV Peak Velocity                  0.71 m/s  Mitral Valve Measurements  ----------------------------------------------------------------------  Name                                 Value        Normal  ----------------------------------------------------------------------    MV Annular  TDI  ----------------------------------------------------------------------  MV Septal e' Velocity             0.08 m/s        >=0.07 MV Lateral e' Velocity            0.10 m/s        >=0.10  Tricuspid Valve Measurements  ----------------------------------------------------------------------  Name                                 Value        Normal  ----------------------------------------------------------------------    TV Regurgitation Doppler  ----------------------------------------------------------------------  TR Peak Velocity                  2.15 m/s        <=2.80 RA Pressure                         3 mmHg           <=3 RV Systolic Pressure               21 mmHg           <36  Aorta / Venous Measurements  ----------------------------------------------------------------------  Name                                 Value        Normal  ----------------------------------------------------------------------    IVC/SVC  ----------------------------------------------------------------------  IVC Diameter (Exp 2D)              2.30 cm        <=2.10      Report Signatures  Finalized by Selinda DELENA Mayans  MD PHD on 02/21/2024 09:49 AM  Preliminary by Aron Rowan  RDCS on 02/21/2024 09:14 AM              Hospital Course:   Reason for admission/ HPI: Per HPI, Patient was awake but slow to respond during evaluation by EMS. No tongue bite or incontinence. No seizure activity during transport or on arrival to ED. Patient is poor historian. She states she fell while on the hallway in the hotel and remembers falling, and then had LOC for about a minute. She has past history of LOC. She is not aware of  having history of seizure.    Hospital Course: Tamara Monteith is a 73 y.o. female with a PMHx of polio, tobacco use, HLD, osteoporosis, chronic back pain, neuropathy, memory loss, admitted 02/19/2024 to observation for seizure activity with diffuse generalized shaking witnessed by bystanders at a hotel where she has  been staying recently.      On ED arrival, VSS.  EKG showed NSR.  Labs notable for: WBC 11.02, Na 131, K 3.0, hs trop 5.9, negative BAL.  CT head showed no acute abnormality.  CT maxillofacial showed no acute facial fractures.  CT C-spine showed postop and degenerative changes, no acute osseous injury.  She was admitted to observation.  She was started on NS with KCl.  PT/OT evaluated and recommended SNF.  UA was negative.  UDS positive for cannabinoids and cocaine.  Initial attempt to complete EEG unsuccessful as patient was restless and not cooperating.  Evening after admission, patient refusing IV fluids and medications, repeatedly asking to go outside to smoke a cigarette.  She was given IV haldol 1 mg for agitation.  Orthostatic vitals were negative.  Echo showed EF 60%, normal LV diastolic function, normal RV systolic function, mild AR, no pHTN. MRI Brain shows hippocampal FLAIR signal abnormality could represent previous seizure activity vs artifact, less likely mesial temporal sclerosis or encephalitis. General neurology consulted given these findings regarding any indication for AEDs, not currently indicated as seizure activity, if occurred, was likely provoked with substance use. rEEG shows no epileptiform activity. Patient will follow up with Neurology in 4-6 weeks, and outpatient repeat MRI brain ordered for 2 weeks from now. Discussed risks of ongoing cocaine use and possibility of contamination when using other substances. [facility] confirmed with [facility] has been staying [facility] and is permitted to return, walker provided to patient and CM coordinated transport to the hotel.     Should f/u with PCP in 3-5 days. Should repeat MRI Brain in 2 weeks and follow up with General Neurology in 4-6 weeks.       Currently the patient is hemodynamically stable for discharge with outpatient follow up as outlined below.    Discharge Condition & Coordination:   Coordination  Updated patient with  discharge plan, all questions answered    Emergency Contact  Extended Emergency Contact Information  Primary Emergency Contact: McDonald,Ana  Mobile Phone: [phone]  Relation: Aunt  Preferred language: English  Interpreter needed? No  Secondary Emergency Contact: Swingle,Janice  Home Phone: [phone]  Relation: Sister  Preferred language: English    Discharge Day Exam:     Temp:  [97.7 F (36.5 C)-98.4 F (36.9 C)] 97.7 F (36.5 C)  Heart Rate:  [57-84] 84  Resp Rate:  [16-18] 17  BP: (102-132)/(48-79) 110/48    General: awake, alert, oriented x 3; no acute distress.  HEENT: perrla, eomi, sclera anicteric, mucous membranes moist  Cardiovascular: regular rate and rhythm, no murmurs, rubs or gallops  Lungs: clear to auscultation bilaterally, without wheezing, rhonchi, or rales  Abdomen: soft, non-tender, non-distended; no palpable masses, normoactive bowel sounds, no rebound or guarding  Extremities: no clubbing, cyanosis, or edema  Neuro: cranial nerves grossly intact, strength 5/5 in upper and lower extremities, sensation intact  Skin: no rashes or lesions noted        Pending Results, Recommendations & Instructions to providers after discharge:   Micro / Labs / Path pending:   Unresulted Labs       None  Date of completion for antibiotics or other medications: n/a     Discharge Instructions:   Most Recent Diet Order:  Orders Placed This Encounter   Procedures    Adult diet Regular       Activity/Weight Bearing Status: Activity as tolerated    Reason for your Hospital Admission:  Possible seizure activity     Instructions for after your discharge:  -- Schedule a follow up visit with your primary care provider - within 1 week of discharge from the hospital.  -- Follow up with Neurology in 4-6 weeks  >> Neurology recommends that you have a repeat MRI in 2 weeks, please call [phone] to schedule     -- Resume your home medications, no changes were made.  -- Hydrate well, activity as tolerated.  --  PT/OT evaluated you in the hospital & did not recommend any further therapy or needs upon discharge.     -- We recommend smoking cessation.   -- Please stop using drugs and/or alcohol , we are concerned about your health. Contact information attached for detox programs through either the [facility], [employer], or the [facility] [employer].      -- Return to the nearest emergency department for chest pain, shortness of breath, fever >100.5 degrees, dizziness, passing out, profuse vomiting or diarrhea, sudden one sided numbness and/or weakness, severe headache, new confusion, trouble speaking, difficulty walking, facial droop, or any other concerning symptoms    Discharge References/Attachments    None         Complete instructions and follow up are in the patient's After Visit Summary    Minutes spent coordinating discharge and reviewing discharge plan:38 minutes     Follow-up Information       Din, Jeryl PENNER, MD Follow up in 3 day(s).    Specialty: Cardiology  Contact information:  [address] [place] [address]  [phone]               [facility] Neurology - [facility] Follow up in 6 week(s).    Specialty: Neurology  Contact information:  [address]  [address]  [place]  [address]  [phone]  Additional information:  We are located at [address], [place] [address], on the [address], across the street from [employer].    Free Valet Parking (Monday through Friday 7:00am-4:30pm) is available at Zone #1 near the Main Entrance. Look for the signs along [employer].    Free Patient and Visitor Self-Parking is available in the [address] [employer] at the [place] end of the campus or just across from the [employer] on the [address] [employer].   From the [address] [employer] enter retail banker on the level you have parked on (B1, B1.5 or B3) and take the elevator to the 9th  floor.   Due to the campus size, please be advised that some appointment locations may require a significant amount of walking. For the safety of our patients and visitors, team members are available to provide wheelchair assistance and directions to appointment locations.                              Signed by: Reche CHRISTELLA Allis, FNP  Discussed in detail with my attending provider: Joesph Alan SAILOR, MD  [facility]  ADULT OBSERVATION UNIT [id number] F: [phone]  [facility]  Department of Medicine  P: [phone]  F: [phone]    CC: Din, Anwar U, MD  Please see attending note that follows this mid-level encounter note.

## 2024-02-22 NOTE — Progress Notes (Addendum)
 Quick Doc  [facility] PROFESSIONAL SERVICES BUILDING OBSERVATION   Patient Name: Ann Patterson   Attending Physician: Joesph Alan SAILOR, MD   Today's date:   02/22/2024 LOS: 0 days   Expected Discharge Date      Quick  Assessment:                                                              Readmission Risk Score: 13.3    CM Comments: 10/29: seizure-like activity, pending EEG and MRI.  DISPO: return to [facility]    CM received notification that patient is being prepared for discharge.  CM made contact with patient via Caregility, sitter in room escorting to bathroom.    Patient will need a FWW for discharge.  [facility] consult placed and PACC Malin added to secure chat.    Patient to [facility] to [facility] [address] [place] [phone]. She will need a taxi ride.      __________________  CM made contact with patient via Caregility and informed of pending walker delivery and [employer] arrangement.  Patient is agreeable and appreciative.      _____________--  Marylyn scheduled for Oct 30, 05:15 PM EDT  Driver is scheduled to arrive on Oct 30 around 5:15 PM EDT, with estimated drop-off at 5:32 PM EDT. Total: $21.87                                                                        Provider Notifications:

## 2024-02-22 NOTE — Progress Notes (Signed)
 Front wheel walker supplied by [employer] delivered to patient room

## 2024-02-22 NOTE — Progress Notes (Signed)
 Routine EEG completed as ordered. Awake, drowsy, sleep. Report to follow.    Sherryle Fillers, R. EEG T.

## 2024-02-22 NOTE — Progress Notes (Signed)
 02/22/24 1710   Medicare Checklist   Is this a Medicare patient? Yes   Patient received 1st IMM Letter? n/a   3 midnight inpatient qualifying stay (SNF only) No   If LOS 3 days or greater, did patient received 2nd IMM Letter? n/a   Discharge Disposition   Patient preference/choice provided? N/A   Physical Discharge Disposition Home   Name of DME Agency [employer] [facility]  (FWW)   Mode of Transportation Taxi  (Ride summary  Martina is arriving at pickup in 7 min  [phone]  Red [employer] RX Hybrid  [id number] $21.87)   Patient/Family/POA notified of transfer plan Patient informed only   Patient agreeable to discharge plan/expected d/c date? Yes   Bedside nurse notified of transport plan? Yes   CM Interventions   Multidisciplinary rounds/family meeting before d/c? Yes

## 2024-02-22 NOTE — Discharge Summary -  Nursing (Signed)
 VIRTUAL NURSE DISCHARGE NOTE:     Education: follow-up apps, when to come back to ER, smoking and drinking cessation encouraged      New discharge medications?: no     Family present during D/C teaching?: no     Follow up appointments: yes     Ride: TBD     Belongings: no belongings w security or pharmacy. Deferred to PN.

## 2024-02-22 NOTE — Progress Notes (Signed)
 Attending Attestation:     I have seen and examined the patient with the APP. I have discussed the plan with APP Barefoot, full note to follow. I have performed the substantive Medical Decision-Making portion of this encounter.     I have personally performed the substantive portion of this visit by conducting a face to face encounter, ordering and reviewing labs/imaging studies, discussing plan of care with consultants and other care team members, and performing medical decision making. A total of 56 minutes was spent.        Assessment and Plan:  #Seizure like activity - possibly convulsive syncope, seizure initially thought to be less likely however today patient reports prior seizures?  #electrolyte abnormalities, POA, hypokalemia and hyponatremia  #Nicotine use  #Alcohol  use disorder  #Substance use disorder - utox positive for cannabinoid and cocaine  #H/o polio  #Osteoporosis  #Underweight by BMI of 16.71  - MRI brain findings noted - will consult neurology   - EEG pending  - sitter in place  - COWS ongoing  - seizure precautions   - PT/OT    DVT ppx: heparin (porcine) injection 5,000 Units      Bedside trio rounds with APP, RN and patient.     Alan Search, MD  Associate Medical Director, [facility] Medicine   Assistant Professor of Medical Education, [facility]  Internal Medicine Hospitalist  Department of Medicine   [facility]  [address]  [place] [address]  T [phone] P [phone]         [facility] for      Please pardon any potential grammatical errors or typos as aspects of this note may have been created through text-to-speech software.

## 2024-02-22 NOTE — OT Progress Note (Signed)
 Occupational Therapy Treatment Doyal Richerd Louder        Post Acute Care Therapy Recommendations:     Discharge Recommendations:  Home with supervision    DME needs IF patient is discharging home: Shower chair, Front wheel walker    Therapy discharge recommendations may change with patient status.  Please refer to most recent note for up-to-date recommendations.      Assessment:   Significant Findings: none    Pt received in bed and was agreeable to OT. Pt able to complete bed mobility, functional transfers, grooming, UBD, LBD with supervision. Pt forgetful at times and requires some cues for safety, however no losses of balance noted. Pt with no further questions or concerns regarding performance of self care tasks at home. Pt has met all acute OT POC goals, no additional skilled acute OT needs at this time. Please reconsult if there is any significant changes in ADL independence or functional mobility. D/C acute OT services.      Treatment Activities: BADL training, Functional mobility/transfer training, ECT/pacing, Pt education    Educated the patient to role of occupational therapy, plan of care, goals of therapy and safety with mobility and ADLs, energy conservation techniques.    Plan:   OT Frequency Recommended: therapy discontinued     Discharge from OT Acute Care Services.    Unit: [facility] PROFESSIONAL SERVICES BUILDING OBSERVATION  Bed: P205/P205.01        Precautions and Contraindications:   Weight Bearing Status: no restrictions  Other Precautions: falls, seizure precautions      Consult received for Miliani Victoria Shrode for OT Evaluation and Treatment.  Patient's medical condition is appropriate for Occupational Therapy intervention at this time.         Past Medical/Surgical History:  PMH: reviewed  PSH: reviewed     Updated Medical Status/Imaging/Tests/Labs:  Rad: reviewed   Lab Results   Component Value Date/Time    HGB 13.7 02/19/2024 11:11 PM    HGB 14.3 03/25/2021 12:31 PM    HCT 39.3  02/19/2024 11:11 PM    HCT 42.7 03/25/2021 12:31 PM    K 3.6 02/21/2024 03:34 AM    K 4.9 09/19/2023 12:21 PM    NA 138 02/21/2024 03:34 AM    NA 141 09/19/2023 12:21 PM    INR 1.0 02/19/2024 11:11 PM    INR 1.0 02/27/2015 01:39 PM    TROPI 5.9 02/19/2024 11:11 PM    TROPI <2.7 11/29/2023 02:05 PM       Subjective:I feel ok      Patient is agreeable to participation in therapy session and Nursing clears patient for therapy.    Patient Goal: Patient Goal: go home  Pain:   Pain Assessment: No/denies pain                Pain interventions: N/A    Objective:   Patient is in bed with peripheral IV in place.  Pt wore mask during therapy session:No       Vital Signs:  Stable with no signs/symptoms of distress    Cognitive Neuro/Status:  Cognition/Neuro Status  Arousal/Alertness: Appropriate responses to stimuli  Attention Span: Appears intact  Orientation Level: Oriented X4  Memory: Appears intact  Following Commands: Follows all commands and directions without difficulty  Safety Awareness: independent  Insights: Fully aware of deficits;Educated in safety awareness  Problem Solving: Assistance required to generate solutions  Behavior: attentive;calm;cooperative;impulsive  Motor Planning: intact  Coordination: intact  Hand Dominance: right handed  Vision:    Ocular Range of Motion: Within Functional Limits  Head Position: Within Defined Limits  Tracking: Able to track stimulus in all quads without difficulty    Activities of Daily Living  Self-care and Home Management  Eating: Modified Independent  Grooming: Supervision  Bathing: Supervision  UB Dressing: Modified Independent  LB Dressing: Supervision  Toileting: Supervision    Bed Mobility & Functional Transfers  Functional Mobility  Supine to Sit Transfers: Supervision  Sit to Supine Transfers: Supervision  Sit to Stand Transfers: Supervision  Stand to Sit Transfers: Supervision  Functional Mobility/Ambulation: Supervision (with no AD)    PMP Activity: Step 6 - Walks  in Room    Balance  Static Sitting: good  Dynamic Sitting: good  Static Standing: good  Dynamic Standing: good          Therapeutic Exercises  During ADL participation             Participation and Activity Tolerance  Participation Effort: good  Endurance: good    Patient left with call bell within reach, all needs met,   SCD's off (as found)  Floor mat not in place (as found)  Bed alarm on  and all questions answered. RN notified of session outcome and patient response.      Goals:  Time For Goal Achievement: 5 visits  ADL Goals  Patient will groom self: Supervision, Goal met  Patient will dress lower body: Supervision, Goal met  Patient will toilet: Supervision, Goal met  Mobility and Transfer Goals  Pt will perform functional transfers: Supervision, Goal met        Executive Fucntion Goals  Pt will follow energy conservation techniques: modified independent, with demonstration of 2 ECT while performing ADLs/IADLs, to increase ability to complete ADLs, Goal met                PPE worn during session: procedural mask and gloves    Tech present: N/A  PPE worn by tech: N/A    Danette Sedonia Kitner OTR/L  Can be reached via secure chat    Time of Treatment:   OT Received On: 02/22/24  Start Time: 0940  Stop Time: 1005  Time Calculation (min): 25 min    OT Visit Number: 1 of 5 visits

## 2024-02-22 NOTE — Plan of Care (Signed)
 Problem: Moderate/High Fall Risk Score >5  Goal: Patient will remain free of falls  Outcome: Progressing     Problem: Compromised Sensory Perception  Goal: Sensory Perception Interventions  Outcome: Progressing     Problem: Compromised Moisture  Goal: Moisture level Interventions  Outcome: Progressing     Problem: Compromised Activity/Mobility  Goal: Activity/Mobility Interventions  Outcome: Progressing     Problem: Neurological Deficit  Goal: Neurological status is stable or improving  Outcome: Progressing     Problem: Potential for Aspiration  Goal: Risk of aspiration will be minimized  Outcome: Progressing     Problem: Peripheral Neurovascular Impairment  Goal: Extremity color, movement, sensation are maintained or improved  Outcome: Progressing     Problem: Impaired Mobility  Goal: Mobility/Activity is maintained at optimal level for patient  Outcome: Progressing     Problem: Communication Impairment  Goal: Will be able to express needs and understand communication  Outcome: Progressing

## 2024-02-22 NOTE — Progress Notes (Addendum)
 Ann Patterson - sister -- [phone]  Richerd Dew - next of kin - aunt -- [phone]  [facility] - aunt

## 2024-02-22 NOTE — Procedures (Signed)
 Patients name: Ann Patterson   Patients date of birth: [birth year]       INPATIENT ROUTINE EEG REPORT    Date of study: 02/22/2024    REASON FOR STUDY: Evaluate for seizure    HISTORY: Patient is a 73 y.o. female with history of Polio, chronic smoker, HLD, osteoporosis, back pain, neuropathy and memory loss who presented with seizure like activity.    INTRODUCTION: A Routine EEG was performed using the standard international 10/20 Electrode Placement system with the following additional electrode(s):EKG     MEDICATIONS: pregabalin  200 mg TID   TECHNICAL PROBLEMS: Muscle Artifact, Movement Artifact    DESCRIPTION OF THE RECORD:    POSTERIOR DOMINANT RHYTHM (ALPHA RHYTHM): 7-8 Hz   Symmetric, reactive to eye opening bilaterally    BETA: 20 Hz 10 uV SYMMETRY: symmetric   LOCATION: frontocentral  AMOUNT: small    SLEEP:  Stage 1 sleep normal    Activation procedures not completed during today's recording    ABNORMAL DISCHARGES:   Occasional brief intermittent bursts of theta slowing    IMPRESSION:  Abnormal EEG: awake and drowsy     CLINICAL CORRELATION:  This is an abnormal EEG due to:  Intermittent generalized slowing of the background which is suggestive of global cerebral dysfunction and a diagnosis of nonspecific mild encephalopathy  No clinical or electrographic seizures seen  No epileptiform discharges are seen.       Victory JULIANNA Bel, MD   [facility]  Board certified in Adult Neurology and Clinical Neurophysiology

## 2024-02-22 NOTE — PT Progress Note (Signed)
 Physical Therapy Note    Physical Therapy Treatment  Ann Patterson  Post Acute Care Therapy Recommendations:     Discharge Recommendations:  Home with supervision    DME needs IF patient is discharging home: Front wheel walker    Therapy discharge recommendations may change with patient status.  Please refer to most recent note for up-to-date recommendations.      Assessment:   Significant Findings: none    Pt received in bed with min encouragement needed to participate.  Pt is able to complete functional mobility of bed mob, transfers, gait on unit with and without use of fww with sba/sup.  Pt with decreased safety awareness and some impulsivity as evidenced by sitting cross legged at EOB and reaching forward.  No overt LOB noted this session.  Pt able to complete gait on level surface and gait with alternating high marching without LOB.  Due to fluctuating functional status, recommend follow up visit to confirm safety.  Pt will continue to benefit from PT to maximize functional independence and safety with mobility.         Treatment Activities: bed mob, transfers, gait training, endurance training    Educated the patient to role of physical therapy, plan of care, goals of therapy and safety with mobility and ADLs, home safety.    Plan:   PT Frequency: 3-4x/wk    Continue plan of care.    Unit: [facility] PROFESSIONAL SERVICES BUILDING OBSERVATION  Bed: P205/P205.01     Precautions and Contraindications:   Falls, seizure precautions, sitter present in room    Updated Medical Status/Imaging/Labs:   Recent Labs   Lab 02/19/24  2311   WBC 11.02*   Hemoglobin 13.7   Hematocrit 39.3   Platelet Count 251       Subjective:    Ok.  Whatever you want to do.  Patient's medical condition is appropriate for Physical Therapy intervention at this time.  Patient is agreeable to participation in the therapy session. Nursing clears patient for therapy.    Pain:   Scale: no c/o pain  Location: n/a  Intervention:  n/a      Objective:   Patient is in bed with no medical equipment in place.  Pt wore mask during therapy session:No    BP in sitting 118/83.  Initial c/o lightheadedness that resolved with time in upright    Cognition  Calm, cooperative with encouragement  Impulsive  Dec insight into deficits  Dec safety awareness    Functional Mobility  Rolling: ind  Supine to Sit: ind  Scooting: ind  Sit to Stand: sba/sup  Stand to Sit: sba/sup  Transfers: sba/sup    Ambulation  PMP - Progressive Mobility Protocol   PMP Activity: Step 7 - Walks out of Room  Distance Walked (ft) (Step 6,7): 350 Feet   Level of Assistance required: sba/sup  Pattern: dec stance time L LE with h/o polio  Device Used: fww, then no AD  Weightbearing Status: no restrictions  Stair Management: n/t  Number of Stairs: n/a  Door Management: n/a  Wheelchair Management: n/a    Balance  Static Sitting: good  Dynamic Sitting: good  Static Standing: good-  Dynamic Standing: good-      Patient Participation: good  Patient Endurance: good-    Patient left with call bell within reach, all needs met, SCDs Not placed as per condition on arrival  , fall mat in place, bed alarm activated, chair alarm n/a and all questions answered.  RN notified of session outcome and patient response.     Goals:  Goals  Goal Formulation: With patient  Time for Goal Acheivement: 5 visits  Goals: Select goal  Pt Will Go Supine To Sit: with stand by assist  Pt Will Perform Sit To Supine: with stand by assist  Pt Will Perform Sit to Stand: with stand by assist  Pt Will Transfer Bed/Chair: with rolling walker, with stand by assist  Pt Will Ambulate: > 200 feet, with rolling walker, with stand by assist      PPE worn during session: gloves    Tech present: none  PPE worn by tech: N/A    Alfonso Blake, MSPT available via Epic Secure Chat      Time of Treatment:  PT Received On: 02/22/24  Start Time: 1000  Stop Time: 1026  Time Calculation (min): 26 min    Treatment # 1 out of 5 visits

## 2024-02-22 NOTE — Progress Notes (Signed)
 Adult Observation Progress Note    Shift Note: trio rounding with Dr. Joesph, NP Reche    General: Patient VSS, in no apparent distress at this time.  Neuro: Patient is AOx4. Patient denies numbness or tingling.   Cardio: Not on telemetry,   Resp: Patient on RA with lung sounds clear bilaterally on auscultation.   Integ: scattered red spots/patches  MSK: Patient ambulates: with walker with: standby assist. low/mod/high: High falls risk.   GI: Bowel sounds auscultated all 4 quadrants.   GU: Patient is continent of urine.   IV Access: 18g R hand    No significant events or provider communication. Patient denies pain at this time, and is resting comfortably in bed.    0737--Neurodiagnostic called and confirmed EEG will be done this AM.    BM during shift: YES/NO: no    Plan:  EEG completed  MRI brain resulted  PT/OT - HWS  Nicotine patch  Seizure precaution  COWS  1:1 sitter  Consult neurology    Discharge Plan: To home, pending medical clearance    Social/Family Visits: n    POC update: pt updated with POC during bedside report

## 2024-02-22 NOTE — Consults (Signed)
 [facility] Consultation Note    Date/Time: 02/22/24 10:52 AM  Patient Name: Ann Patterson  Requesting Physician: Ann Alan SAILOR, MD  Date of Admission: 02/19/2024    CC / Reason for Consultation: Seizure like activity    Patient is covered by [facility]  *For new consults or questions from 0700-1900, please send secure chat to "[facility]". Additionally, we can be reached at [phone] from 0800-1630 daily.  *For urgent consults or questions from 1900-0700, please call the [facility] answering service at [phone]. Non-urgent messages can be left on voicemail at [phone] and will be checked daily at 0800.    Assessment   Ann Patterson is a 73 y.o. female with history of Polio, chronic smoker, HLD, osteoporosis, back pain, neuropathy and memory loss who presented on 02/19/24 with:    Seizure like activity - clinically c/w convulsive syncope   - Utox + cocaine and cannabinoid   - MRI brain with FLAIR signal subtly greater along the R hippocampus/mesial temporal region compared to the left      Plan   - rEEG done, report pending  - Recommend repeat MRI brain w/wo contrast (epilepsy protocol) in 2 weeks for surveillance monitoring of temporal lobe lesions.   - No need for AEDs at this time as likely would have been provoked if a seizure occurred  - Medical management per primary team  - Supportive care  - If EEG is unremarkable, ok to discharge from a neuro standpoint with outpatient follow up in 4-6 weeks    Case was discussed with Dr Peggye Moss, who agrees with plan of care above. APP time spent: 25 minutes.    __________________________________    Neurology attending attestation:     I performed the substantive portion of this visit by providing more than 50% of the total time and spent 35 minutes. Patient was also seen by APP who spent 25 minutes. I agree with the findings and plan as outlined, with any highlights or additions as noted above.    A total of 60  minutes were spent face-to-face with the patient for which >50% was spent counseling and coordinating care.      Peggye Moss, MD  ___________________________________      HPI   Ann Patterson is a 73 y.o. female with significant PMHx of Polio, chronic smoker, HLD, osteoporosis, back pain, neuropathy and memory loss who presented to the hospital on 02/19/24 with seizure like activity. Per records, pt was brought to the ED by ambulance for seizure activity at a hotel where she has been staying recently. Bystanders saw generalized diffuse shaking while patient was on the ground. When EMS arrived, pt was awake but slow to respond. No tongue bite or incontinence was noted. In the ED, pt reported that she had fallen in the hallway and remembers falling, but then had LOC for about a minute. She does have a history of syncope. Per pt, she rarely drinks alcohol  but had been that day. She denies drug abuse.     Pt states that she has had almost passing out episodes since she was a little girl. She states that she gets a tingling sensation in her head and she has been taught to lay down with her feet up in the air. She notes that she has only passed out once as an adult. She states that this episode was very similar to her previous pre-syncopal episodes. She states that she stood up and started walking  but felt very dizzy. She states that she laid down and put her feet up. She states that she was sweating and her vision was closing in. She does not think she lost consciousness or shook, but states that her vision started coming back right away. She states that she was not aware of doing any cocaine. She notes that her friend could have put it in her tea though. She states that she drinks 20 cups of tea a day. She states that she feels better today. She denies headaches, N/V, visual changes, dizziness, weakness or paresthesias.      Past Medical History   Medical History[1]     Past Surgical History   Past Surgical  History[2]     Family Medical History   Family History[3]    Social History   Social History[4]    Medications     Home : Prescriptions Prior to Admission[5]   Inpatient : Current Facility-Administered Medications[6]      Allergies   Gadolinium, Iodine, and Penicillins    Review of Systems   All other systems were reviewed and are negative except for that mentioned in the HPI    Physical Exam   Temp:  [97.9 F (36.6 C)-98.4 F (36.9 C)] 98.1 F (36.7 C)  Heart Rate:  [57-75] 75  Resp Rate:  [16-18] 16  BP: (102-132)/(54-79) 110/79     General: Well developed and well nourished. No acute distress. Cooperative with the exam  ENT: Normal oral mucosa, no ear or nose discharge  Neck: Symmetric, no deformities  CV: RRR  Resp: No audible wheezing, normal work of breathing  Abd: Soft, nondistended  Skin: Intact, extremities normal in color  Psych: Affect is normal, good insight    Mental Status: The patient is awake, alert and oriented to person, place, month and year.   Fund of knowledge appropriate  Recent and remote memory are intact  Attention span and concentration appear normal  Language function is normal. There is no evidence of aphasia in conversational speech    Cranial nerves:   -CN III, IV, VI: Pupils equal, round, and reactive to light; extraocular movements intact; no ptosis  -CN V: Facial sensation intact in V1 through V3 distributions  -CN VII: Face symmetric  -CN VIII: Hearing intact to conversational speech  -CN IX, X: Normal phonation  -CN XI: Symmetric full strength of trapezius muscles  -CN XII: Tongue protrudes midline    Motor: Muscle tone normal without spasticity or flaccidity. No atrophy. No pronator drift     UEs:   Deltoid Bicep Tricep Grip   Right 5 5 5 5    Left 5 5 5 5      LEs:   HF PF DF   Right 5 5 5    Left 4 5 5    LLE weakness chronic 2/2 polio    Sensory:  Light touch intact    Reflexes:   B BR P   Right 1 1 1    Left 1 1 1      Plantars: Flexor    Coordination: No tremors    Gait:  Deferred    Labs        Imaging     Results for orders placed or performed during the hospital encounter of 02/19/24   MRI Brain WO Contrast    Narrative    HISTORY: seizure activity.     COMPARISON: Head CT from 02/19/2024. Brain MRI from 08/12/2010.     TECHNIQUE: MRI of the brain  performed on a 1.5 Tesla scanner without  intravenous contrast. Examination performed per epilepsy protocol with high  resolution coronal images through the temporal lobes.     CONTRAST: None.    FINDINGS:     Images are variably degraded by motion.     Patchy T2/FLAIR hyperintensities, consistent with chronic small vessel  disease, are present in the cerebral white matter and pons. Extent has  progressed compared to MRI from 2012. There is generalized brain volume  loss. The hippocampi are not markedly different in size. Evaluation of the  hippocampi is substantially limited on coronal T2-weighted imaging through  the temporal lobes due to patient motion. FLAIR signal appears subtly  greater along the right hippocampus/mesial temporal region (for example,  series 4, image 13). No restricted diffusion/acute infarct, hemorrhage,  extra-axial collection, hydrocephalus, or mass effect is identified. Large  vessel vascular flow-related signal voids are visible.    No focal marrow replacing lesion is identified in the calvarium.     The mastoid air cells are clear. There is a small mucous retention cyst in  the left sphenoid sinus.    There is bilateral pseudophakia.      Impression         1.No acute infarct, hemorrhage, hydrocephalus, or mass effect.  2.FLAIR signal appears subtly greater along the right hippocampus/mesial  temporal region compared to the left. This may just be artifactual. Recent  seizure activity could also cause this appearance. An underlying underlying  process such as encephalitis or mesial temporal sclerosis (although without  marked asymmetry in size of the hippocampi) would be difficult to exclude  in the appropriate  clinical setting. Images are variably degraded by  motion. Coronal T2-weighted images through the temporal lobes are  particularly affected, which limits evaluation. Consider follow-up MRI when  the patient is better able to cooperate or with sedation if there is  ongoing clinical concern.  3.Patchy chronic small vessel ischemic changes and generalized brain volume  loss.    Lynwood Artist Gee, MD  02/21/2024 8:48 PM   CT Head without Contrast    Narrative    HISTORY: Head and face injury.    COMPARISON: May CT dated November 29, 2023.     TECHNIQUE: CT of the head performed without intravenous contrast. CT of the  paranasal sinuses performed without intravenous contrast. Multiplanar  reformatted images were created and reviewed. The following dose reduction  techniques were utilized: automated exposure control and/or adjustment of  the mA and/or KV according to patient size, and the use of an iterative  reconstruction technique.    CONTRAST: None.    FINDINGS:  Head CT:  The ventricular system and cisterns are normally configured. There is mild  chronic small vessel ischemic disease in the supratentorial white matter.  There is no evidence of a mass effect or an acute intracranial hemorrhage.  The calvarium is intact.    Paranasal sinuses:  No acute fractures are seen the visualized portions of some of the face.  The orbits are unremarkable appearing. There is moderately prominent dental  disease. Findings in the cervical spine are separately described. There is  minor mucosal thickening in the ethmoid air cells.      Impression    1.Mild chronic small vessel ischemic disease.  2.No acute intracranial hemorrhage or acute cranial fractures.  3.No acute facial fractures are seen.    Krzysztof M. Trixie, MD  02/19/2024 11:38 PM       Signed by:  Rocky  Neysa RIGGERS  Physician Assistant  [facility]  Daytime: Spectralink [phone] or EpicChat  Consult requests: Spectralink [phone]  If urgent after 7:00 pm:  [phone]    Please see attending Neurologist note that accompanies this mid-level encounter note.           [1]   Past Medical History:  Diagnosis Date    Headache     Hx of fall 04/05/2018    Hyperlipidemia     Low back pain     Memory loss 02/07/2011    Meningitis spinal 1956    spinal and regular    Neuropathy     upper c spine issues, + carpal tunnel symptoms bilaterally    Osteoporosis 12/07/2020    Pneumonia 2015    Polio 1956    Vertigo    [2]   Past Surgical History:  Procedure Laterality Date    APPENDECTOMY (OPEN)      73 years of age    BACK SURGERY  2005    plate in her neck-    CARPAL TUNNEL RELEASE      bilateral hands 1994 and 1996    EXTRACTION, CATARACT, PHACO, IOL Left 02/28/2019    Procedure: EXTRACTION, CATARACT, PHACO, IOL;  Surgeon: Butler Deatrice ORN, MD;  Location: [facility];  Service: Ophthalmology;  Laterality: Left;  LEFT  EYE PHACO WITH IOL    EXTRACTION, CATARACT, PHACO, IOL Right 03/14/2019    Procedure: EXTRACTION, CATARACT, PHACO, IOL;  Surgeon: Butler Deatrice ORN, MD;  Location: [facility] OR;  Service: Ophthalmology;  Laterality: Right;  RIGHT  EYE PHACO WITH IOL    FRACTURE SURGERY Left     at 73 years of age    HYSTERECTOMY      1995    ORIF, FINGER Left 03/02/2015    Procedure: ORIF, FINGER;  Surgeon: Bolling Oh, MD;  Location: [facility] OR;  Service: Plastics;  Laterality: Left;  LEFT SMALL FINGER METACARPAL ORIF    REPAIR, HAND, NERVE  12/10/2012    Procedure: REPAIR, HAND, NERVE;  Surgeon: Bolling Oh, MD;  Location: [facility];  Service: Plastics;  Laterality: Left;  LEFT SMALL FINGER EXPLORATION; REPAIR DIGITAL NERVE    REPAIR, LIGAMENT Left 03/02/2015    Procedure: REPAIR, LIGAMENT;  Surgeon: Mehan, Vineet, MD;  Location: [facility] OR;  Service: Plastics;  Laterality: Left;  LEFT MIDDLE FINGER RADIAL COLLATERAL LIGAMENT REPAIR   [3]   Family History  Problem Relation Name Age of Onset    Myocardial Infarction Mother      Diverticulitis Sister       Diabetes Maternal Grandmother      Brain cancer Maternal Grandfather      Breast cancer Neg Hx     [4]   Social History  Tobacco Use    Smoking status: Every Day     Current packs/day: 0.50     Average packs/day: 0.5 packs/day for 30.0 years (15.0 ttl pk-yrs)     Types: Cigarettes    Smokeless tobacco: Never    Tobacco comments:     5-6 cigarettes per day    Vaping Use    Vaping status: Never Used   Substance Use Topics    Alcohol  use: Yes     Comment: occasional, shots of corazon on special occasions    Drug use: Not Currently     Frequency: 2.0 times per week     Types: Marijuana     Comment: uses fro pain rellief  will  hold use 02/28/15   [5]   Medications Prior to Admission   Medication Sig Dispense Refill Last Dose/Taking    fluticasone (FLONASE) 50 MCG/ACT nasal spray APPLY ONE SPRAY IN EACH NOSTRIL EVERY DAY   02/19/2024    oxybutynin XL (DITROPAN-XL) 5 MG 24 hr tablet Take 1 tablet (5 mg) by mouth once daily   02/19/2024    pregabalin  (LYRICA ) 200 MG capsule Take 1 capsule (200 mg) by mouth 3 (three) times daily 30 capsule 4 02/19/2024    raloxifene  (EVISTA ) 60 MG tablet Take 1 tablet (60 mg total) by mouth daily. 30 tablet 3 02/19/2024    ciclopirox (PENLAC) 8 % solution APPLY TO AFFECTED NAILS NIGHTLY UNTIL CONDITION IMPROVES AS DIRECTED   Unknown    denosumab  (PROLIA ) 60 MG/ML Solution Prefilled Syringe subcutaneous injection INJECT 60 MG INTO THE SKIN ONCE FOR 1 DOSE EVERY 6 MONTHS 1 mL 1 Unknown    escitalopram  (LEXAPRO ) 10 MG tablet Take 1 tablet (10 mg) by mouth once daily For depression 30 tablet 1     pravastatin (PRAVACHOL) 20 MG tablet Take 1 tablet (20 mg) by mouth once at bedtime   Unknown    sodium chloride  (MURO 128) 5 % ophthalmic solution 1 drop as needed       vitamin D , ergocalciferol , (DRISDOL ) 50000 UNIT Cap Take 1 capsule (50,000 Units) by mouth once a week 12 capsule 3    [6]   Current Facility-Administered Medications   Medication Dose Route Frequency    atorvastatin  10 mg Oral  Daily    heparin (porcine)  5,000 Units Subcutaneous Q12H [facility]    nicotine  1 patch Transdermal Daily    oxybutynin XL  5 mg Oral Daily    pregabalin   200 mg Oral TID    raloxifene   60 mg Oral Daily

## 2024-02-22 NOTE — Progress Notes (Addendum)
 ADULT OBSERVATION UNIT  DISCHARGE NOTE        Date Time: 02/22/2024  Patient Name: Ann Patterson  Attending Physician: Dr. Joesph Palma MD        Date of Admission: 02/19/2024        Discharge Instructions:                  Patient stable for discharge. Discharge instructions given, patient teachings done and verbalized understanding. All questions answered at this time. Aware to follow-up and schedule appointments as necessary. IV access and cardiac monitor discontinued. Discharge to home. CM setup transportation/lyft to home. Wheelchair transport requested to [facility]. Pt is dressed and leaves with all belongings and FWW. Family Alica) made aware of patient's plan.        Patient aware to follow-up with MD and PMD as needed.     Signed by: Carollee Caller, RN

## 2024-02-23 NOTE — Discharge Summary (Signed)
 Requesting final auth to be faxed to [phone] or call [phone]        Auth Number :  [id number]    Discharge Date -  02/22/2024  5:30 PM    ADMIT DATE: 02/19/2024         Medication List        CONTINUE taking these medications      ciclopirox 8 % solution  Commonly known as: PENLAC     escitalopram  10 MG tablet  Commonly known as: LEXAPRO   Take 1 tablet (10 mg) by mouth once daily For depression     fluticasone 50 MCG/ACT nasal spray  Commonly known as: FLONASE     oxybutynin XL 5 MG 24 hr tablet  Commonly known as: DITROPAN-XL     pravastatin 20 MG tablet  Commonly known as: PRAVACHOL     pregabalin  200 MG capsule  Commonly known as: LYRICA   Take 1 capsule (200 mg) by mouth 3 (three) times daily     Prolia  60 MG/ML Sosy subcutaneous injection  Generic drug: denosumab   INJECT 60 MG INTO THE SKIN ONCE FOR 1 DOSE EVERY 6 MONTHS     raloxifene  60 MG tablet  Commonly known as: EVISTA   Take 1 tablet (60 mg total) by mouth daily.     sodium chloride  5 % ophthalmic solution  Commonly known as: MURO 128     vitamin D  (ergocalciferol ) 50000 UNIT Caps  Commonly known as: DRISDOL   Take 1 capsule (50,000 Units) by mouth once a week                  NAME: Ann Patterson             MR#: [id number]      Patient Address:  [address]  [facility] [place] [address]    Patient phone: [phone] (home)     PATIENT NAME: Ann Patterson, Ann Patterson  DOB: August 03, 1950   PMH:  has a past medical history of Headache, fall (04/05/2018), Hyperlipidemia, Low back pain, Memory loss (02/07/2011), Meningitis spinal (1956), Neuropathy, Osteoporosis (12/07/2020), Pneumonia (2015), Polio (1956), and Vertigo.  PSH:  has a past surgical history that includes Hysterectomy; APPENDECTOMY (OPEN); Carpal tunnel release; Back surgery (2005); Fracture surgery (Left); REPAIR, HAND, NERVE (12/10/2012); REPAIR, LIGAMENT (Left, 03/02/2015); ORIF, FINGER (Left, 03/02/2015); EXTRACTION, CATARACT, PHACO, IOL (Left, 02/28/2019); and EXTRACTION, CATARACT, PHACO, IOL  (Right, 03/14/2019).      DIAGNOSIS:     ICD-10-CM    1. Seizure-like activity (CMS/HCC) [R56.9]  R56.9       2. Altered mental status  R41.82       3. Seizure-like activity (CMS/HCC)  R56.9       4. Abnormal brain MRI  R90.89 MRI brain with and without contrast          Information sent by a UR Department Case Management Assistant

## 2024-03-22 ENCOUNTER — Other Ambulatory Visit: Payer: Self-pay

## 2024-03-22 ENCOUNTER — Other Ambulatory Visit (INDEPENDENT_AMBULATORY_CARE_PROVIDER_SITE_OTHER): Payer: Self-pay

## 2024-03-22 MED ORDER — DENOSUMAB 60 MG/ML SC SOSY
PREFILLED_SYRINGE | SUBCUTANEOUS | 1 refills | Status: AC
Start: 2024-03-22 — End: 2025-03-21
  Filled 2024-03-22: qty 1, 180d supply, fill #0

## 2024-03-22 NOTE — Progress Notes (Signed)
 Received refill eRx via [facility] and ran test claim via [facility]. No PA required. $0 copay. Patient has an appt on 12/05. Patient's phone number not working unable to get ahold of patient to offer CMM. LVM for patient's sister phone number. Scheduling delivery to [facility] on 12/03

## 2024-03-22 NOTE — Progress Notes (Signed)
 The prescription has been received by the Pharmacy Care Team. A benefit investigation is currently in process.    To follow up on the outcome of the benefit investigation, please check Episodes, Referrals or specialty pharmacy encounter section in the Patient chart review

## 2024-03-22 NOTE — Progress Notes (Signed)
 Specialty Pharmacy Refill Note    Ann Patterson is a 73 y.o. female, who is being followed by [facility] team for management of: RxSp Osteoporosis (Enrolled) for the following services:  Clinical Management  Refill Management  Benefits and PA Management    Medications monitored by Specialty Pharmacy:   denosumab  (PROLIA ) 60 MG/ML Solution Prefilled Syringe subcutaneous injection   INJECT 60 MG INTO THE SKIN ONCE FOR 1 DOSE EVERY 6 MONTHS      Refill Coordination  Medications Linked to Program: Denosumab  (PROLIA )  HIPAA verified (patient name & dob or patient name & address) with approved contact: Chart Review  Financial problems or insurance changes : No  Doses left of specialty medications: 0  Does the patient have the correct number of remaining doses: Yes  Patient confirmations: received welcome packet, patient bill of rights, & privacy practices: Patient has received all  Patient confirmation: patient previously received the drug monograph(s) for their specialty medication(s): Yes    Delivery Information  Delivery confirmation: signature required for delivery: Yes  Delivery method: Deliver to Clinic (Comment)  Delivery confirmation: delivery address: Delivery Address Reviewed & Accurate  Enter delivery address: [address], [place] [address]  Delivery confirmation: patient informed of and confirms delivery date. Enter Delivery date: : 03/26/24  Preferred time?: AM  Number of medications in delivery: 1  Is there any medication that is due not being filled?: No  Supplies needed?: No supplies needed  Does patient have any concerns about safely storing medications (at the correct temperature, away from children/pets,etc.)?: No  Do any medications need mixed or dated?: No  Financial confirmation: patient informed of financial responsibility and copay amount: Yes  Financial responsibility: copay amount: $0  Additional comments: Patient has an appt on 12/05  Questions or  concerns for the pharmacist?: No  Are any medications first time fills?: No        Medicare Part B Fill? No    Shane Flavors

## 2024-03-25 ENCOUNTER — Other Ambulatory Visit: Payer: Self-pay

## 2024-03-29 ENCOUNTER — Encounter (INDEPENDENT_AMBULATORY_CARE_PROVIDER_SITE_OTHER): Payer: Self-pay | Admitting: Internal Medicine

## 2024-03-29 ENCOUNTER — Ambulatory Visit (INDEPENDENT_AMBULATORY_CARE_PROVIDER_SITE_OTHER): Admitting: Internal Medicine

## 2024-03-29 VITALS — BP 100/62 | HR 81 | Wt 97.0 lb

## 2024-03-29 DIAGNOSIS — Z79899 Other long term (current) drug therapy: Secondary | ICD-10-CM

## 2024-03-29 DIAGNOSIS — M810 Age-related osteoporosis without current pathological fracture: Secondary | ICD-10-CM

## 2024-03-29 DIAGNOSIS — M549 Dorsalgia, unspecified: Secondary | ICD-10-CM

## 2024-03-29 DIAGNOSIS — F17200 Nicotine dependence, unspecified, uncomplicated: Secondary | ICD-10-CM

## 2024-03-29 MED ORDER — VITAMIN D (ERGOCALCIFEROL) 1.25 MG (50000 UT) PO CAPS: 50000.0000 [IU] | ORAL_CAPSULE | ORAL | 3 refills | Status: AC

## 2024-03-29 MED ORDER — DENOSUMAB 60 MG/ML SC SOSY
60.0000 mg | PREFILLED_SYRINGE | Freq: Once | SUBCUTANEOUS | Status: AC
  Administered 2024-03-29: 60 mg via SUBCUTANEOUS

## 2024-03-29 NOTE — Patient Instructions (Signed)
 Osteoporosis:     1. Order labs   2. Recommend Prolia  60 mg SQ every 6 months on 03/29/2024, then 6 months in 09/2024   3. Recommend total calcium  (diet and supplement) 1200 mg daily  4. Recommend Vit D 50,000 iu weekly  5. Exercise      Recommend  as tolerated assuming activities can be performed            safely with low risk of fall and/or injury.  6. Fall Risk Reduction      Recommend self-guided home assessment for fall risk (elimination of clutter, cords, rugs, etc)      Recommend participation in piliates and/or t'ai chi as able  7. Stop smoking       Osteoarthritis/Joint pain     Recommend Tylenol  as needed         Follow 6 months

## 2024-03-29 NOTE — Progress Notes (Signed)
 Patient arrived to clinic for scheduled Prolia  injection.  Physician order in Epic.  Medication was removed from refrigerator 30 minutes prior to injection.  Patient brought to room, name and DOB verified, patient made ware of medication given.  Medication verified with MD.  Injection administered per 6 rights of medication administration.  Patient waited 15 minutes after administration, no adverse reactions noted.    Administered Prolia  60mg /ml subcutaneously to patient's upper Left arm.    NDC: [address]-78  LOT: [id number]  EXP: 12/23/2025

## 2024-03-29 NOTE — Progress Notes (Signed)
 Initial Rheumatology Consultation    Chief Complaint:     Osteoporosis       HPI:   This patient is a 73 y.o. year old female with Osteoporosis ( started Prolia  in 2020) chronic smoker, DJD of C-spine and L-spine, OA of Rt hip, Polio with left leg muscle atrophy referred by Din, Anwar Patterson, Patterson to evaluate Osteoporosis.     She has Osteoporosis.  She started Prolia  in 2020 with her GYN Dr. Ian Patterson at [facility]. She had Prolia  in 03/2020. She did not followed with Dr. Ian Patterson at [facility].     She had Prolia  60mg  injection on 04/13/2021, 09/2021, 03/2022, 10/2022, 03/2023 at [facility]. No side effect.   Her son passed away in Jul 30, 2023. She had to move and did not have Prolia  in 09/2023.   She lives at hotel.     She takes calcium  600 mg BID and Vit D 50,000 iu weekly.  She does walking exercise 30 minutes and does yard work. She had no history of cancer, chemotherapy or radiation therapy.    The patient's risk factors for osteoporosis include:  Had hysterectomy without hormone replacement therapy  History of fragility fracture of left wrist   No Family history of osteoporosis and hip fracture  Current smoker: tries to cut down smoking, less than half pack day, smoke for  50 years  No Alcohol  usages drinks per day  She has weight loss 20 lbs in the past over 10 yrs. She has weight gain about 10 lbs. Her weight is about 100 lbs.     She had not down DXA.     DXA bone Density study on 12/18/2020:   The AP Spine BMD=  g/cm2, T-Score= -0.0  The left femoral neck BMD=  g/cm2, T-Score= -1.9  The left hip BMD= g/cm2, T-Score=-2.6     Compared to study done 2021  there is no significant changes at the spine and a significant increase at the hip of 4.1%.    DXA bone Density study on 07/19/19:   The AP Spine BMD=  g/cm2, T-Score= -0.1  The left femoral neck BMD=  g/cm2, T-Score= -2.2  The left hip BMD= g/cm2, T-Score=-2.8     24% increase from baseline in 2012. Please note that lumbar spine BMD values may  be falsely elevated due to  sclerosis.    She has more back pain and left buttock area.          PMSH:     Past Medical History:   Diagnosis Date    Headache     Hx of fall 04/05/2018    Hyperlipidemia     Low back pain     Memory loss 02/07/2011    Meningitis spinal 1956    spinal and regular    Neuropathy     upper c spine issues, + carpal tunnel symptoms bilaterally    Osteoporosis 12/07/2020    Pneumonia 2015    Polio 1956    Vertigo        Past Surgical History:   Procedure Laterality Date    APPENDECTOMY (OPEN)      73 years of age    BACK SURGERY  2005    plate in her neck-    CARPAL TUNNEL RELEASE      bilateral hands 1994 and 1996    EXTRACTION, CATARACT, PHACO, IOL Left 02/28/2019    Procedure: EXTRACTION, CATARACT, PHACO, IOL;  Surgeon: Ann Deatrice ORN, Patterson;  Location: [facility];  Service: Ophthalmology;  Laterality: Left;  LEFT  EYE PHACO WITH IOL    EXTRACTION, CATARACT, PHACO, IOL Right 03/14/2019    Procedure: EXTRACTION, CATARACT, PHACO, IOL;  Surgeon: Ann Deatrice ORN, Patterson;  Location: [facility] OR;  Service: Ophthalmology;  Laterality: Right;  RIGHT  EYE PHACO WITH IOL    FRACTURE SURGERY Left     at 73 years of age    HYSTERECTOMY      1995    ORIF, FINGER Left 03/02/2015    Procedure: ORIF, FINGER;  Surgeon: Ann Ann Patterson;  Location: [facility] OR;  Service: Plastics;  Laterality: Left;  LEFT SMALL FINGER METACARPAL ORIF    REPAIR, HAND, NERVE  12/10/2012    Procedure: REPAIR, HAND, NERVE;  Surgeon: Ann Ann Patterson;  Location: [facility];  Service: Plastics;  Laterality: Left;  LEFT SMALL FINGER EXPLORATION; REPAIR DIGITAL NERVE    REPAIR, LIGAMENT Left 03/02/2015    Procedure: REPAIR, LIGAMENT;  Surgeon: Mehan, Ann Patterson;  Location:  [facility] OR;  Service: Plastics;  Laterality: Left;  LEFT MIDDLE FINGER RADIAL COLLATERAL LIGAMENT REPAIR       Allergies:     Allergies   Allergen Reactions    Gadolinium      Heart fluttering    Iodine      Heart fluttering    Penicillins Hives       Meds:      Current Outpatient Medications:     ciclopirox (PENLAC) 8 % solution, APPLY TO AFFECTED NAILS NIGHTLY UNTIL CONDITION IMPROVES AS DIRECTED, Disp: , Rfl:     denosumab  (PROLIA ) 60 MG/ML Solution Prefilled Syringe subcutaneous injection, INJECT 60 MG INTO THE SKIN ONCE FOR 1 DOSE EVERY 6 MONTHS, Disp: 1 mL, Rfl: 1    escitalopram  (LEXAPRO ) 10 MG tablet, Take 1 tablet (10 mg) by mouth once daily For depression, Disp: 30 tablet, Rfl: 1    fluticasone (FLONASE) 50 MCG/ACT nasal spray, APPLY ONE SPRAY IN EACH NOSTRIL EVERY DAY, Disp: , Rfl:     oxybutynin  XL (DITROPAN -XL) 5 MG 24 hr tablet, Take 1 tablet (5 mg) by mouth once daily, Disp: , Rfl:     pravastatin (PRAVACHOL) 20 MG tablet, Take 1 tablet (20 mg) by mouth once at bedtime, Disp: , Rfl:     pregabalin  (LYRICA ) 200 MG capsule, Take 1 capsule (200 mg) by mouth 3 (three) times daily, Disp: 30 capsule, Rfl: 4    raloxifene  (EVISTA ) 60 MG tablet, Take 1 tablet (60 mg total) by mouth daily., Disp: 30 tablet, Rfl: 3    sodium chloride  (MURO 128) 5 % ophthalmic solution, 1 drop as needed, Disp: , Rfl:     vitamin D , ergocalciferol , (DRISDOL ) 50000 UNIT Cap, Take 1 capsule (50,000 Units) by mouth once a week, Disp: 12 capsule, Rfl: 3    FH:     Family History   Problem Relation Name Age of Onset    Myocardial Infarction Mother      Diverticulitis Sister      Diabetes Maternal Grandmother      Brain cancer Maternal Grandfather      Breast cancer Neg Hx         SH:     Social History     Socioeconomic History    Marital status: Single   Tobacco Use    Smoking status: Every Day     Current packs/day: 0.50     Average packs/day: 0.5 packs/day for 30.0 years (15.0 ttl pk-yrs)  Types: Cigarettes    Smokeless tobacco: Never    Tobacco comments:     5-6 cigarettes per day    Vaping Use    Vaping status: Never Used   Substance and Sexual Activity    Alcohol  use: Yes     Comment: occasional, shots of corazon on special occasions    Drug use: Not Currently     Frequency: 2.0  times per week     Types: Marijuana     Comment: uses fro pain rellief  will hold use 02/28/15     Social Drivers of Health     Food Insecurity: No Food Insecurity (02/20/2024)    Hunger Vital Sign     Worried About Running Out of Food in the Last Year: Never true     Ran Out of Food in the Last Year: Never true   Recent Concern: Food Insecurity - Food Insecurity Present (11/29/2023)    Hunger Vital Sign     Worried About Programme Researcher, Broadcasting/film/video in the Last Year: Often true     Ran Out of Food in the Last Year: Often true   Transportation Needs: Unmet Transportation Needs (02/20/2024)    PRAPARE - Therapist, Art (Medical): Yes     Lack of Transportation (Non-Medical): Yes   Intimate Partner Violence: Not At Risk (02/20/2024)    Humiliation, Afraid, Rape, and Kick questionnaire     Fear of Current or Ex-Partner: No     Emotionally Abused: No     Physically Abused: No     Sexually Abused: No   Housing Stability: Not At Risk (02/20/2024)    Housing Stability NCSS     Do you have housing?: Yes     Are you worried about losing your housing?: No   Recent Concern: Housing Stability - At Risk (11/29/2023)    Housing Stability NCSS     Do you have housing?: No     Are you worried about losing your housing?: Yes       ROS:   All other systems reviewed and negative except as described above.        PHYSICAL EXAM:     Vitals:    03/29/24 1456   BP: 100/62   Pulse: 81         General appearance - alert, well appearing, and in no distress  Mental status - alert, oriented to person, place, and time  Eyes - extraocular eye movements intact  Back exam - + tenderness of para L-spine  Neurological - alert, oriented, normal speech, no focal findings or movement disorder noted  Extremities - peripheral pulses normal, no pedal edema, no clubbing or cyanosis  Skin - normal coloration and turgor, no rashes, no suspicious skin lesions noted  Musculoskeletal - No tenderness of MCPs and PIPs. Good range of motion of  shoulders, elbows, wrists and small joints of hands. Good range of motion of hips, knees and ankles.  Knees crepitus with range of motion of knees without effusions. Ankles without effusions.  No tenderness of MTPs or effusions.   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             Labs:     Component      Latest Ref Rng & Units 03/18/2018   WBC      3.10 - 9.50 x10 3/uL 9.24   Hemoglobin      11.4 - 14.8 g/dL 12.2  Hematocrit      34.7 - 43.7 % 36.1   Platelet Count      142 - 346 x10 3/uL 206   RBC      3.90 - 5.10 x10 6/uL 4.09   MCV      78.0 - 96.0 fL 88.3   MCH      25.1 - 33.5 pg 29.8   MCHC      31.5 - 35.8 g/dL 66.1   RDW      11 - 15 % 19 (H)   MPV      8.9 - 12.5 fL 10.2   Neutrophils      None % 45.9   Lymphocytes Automated      None % 41.5   Monocytes      None % 9.6   Eosinophils Automated      None % 2.1   Basophils Automated      None % 0.6   Immature Granulocytes      None % 0.3   Nucleated RBC      0.0 - 0.0 /100 WBC 0.0   Neutrophils Absolute      1.10 - 6.33 x10 3/uL 4.24   Lymphocytes Absolute Automated      0.42 - 3.22 x10 3/uL 3.83 (H)   Monocytes Absolute Automated      0.21 - 0.85 x10 3/uL 0.89 (H)   Eosinophils Absolute Automated      0.00 - 0.44 x10 3/uL 0.19   Basophils Absolute Automated      0.00 - 0.08 x10 3/uL 0.06   Immature Granulocytes Absolute      0.00 - 0.07 x10 3/uL 0.03   Nucleated RBC Absolute      0.00 - 0.00 x10 3/uL 0.00   Glucose      70 - 100 mg/dL 93   BUN      7.0 - 80.9 mg/dL 89.9   Creatinine      0.6 - 1.0 mg/dL 0.8   Calcium       8.5 - 10.5 mg/dL 8.9   Sodium      863 - 145 mEq/L 141   Potassium      3.5 - 5.1 mEq/L 3.7   Chloride      100 - 111 mEq/L 106   CO2      22 - 29 mEq/L 22       Radiology:     DXA bone density axial skeleton [IMG572] (Order [id number])  Status: Final result     Study Result    Narrative & Impression   INDICATION: Osteoporosis screening.  Menopause     INTERPRETATION:  DEXA methodology was utilized to evaluate the bone  mineral density.  The  measurements are as follows:     PRIOR: 04/25/2011     Left hip- total:  BMD:  0.604 g/cm2.  T-score:  -2.8  Z- score: -1.4  7.3% increase from baseline.     Left femoral neck:  BMD: 0.610 g/cm/2  T-score: -2.2  Z- score: -0.5     Spine (L1-L4):  BMD:  1.032 g/cm2.  T-score:  -0.1  Z- score: 1.7  24% increase from baseline. Please note that lumbar spine BMD values may  be falsely elevated due to sclerosis.        IMPRESSION:    Osteoporosis of the left hip. Osteopenia of the left femoral  neck.       ASSESSMENT:   Ann Patterson is a 73  y.o. female with Osteoporosis ( started Prolia  in 2020) chronic smoker, DJD of C-spine and L-spine, OA of Rt hip, Polio with left leg muscle atrophy.    She has Osteoporosis and had Prolia  ( in 03/2020, 03/2021, 09/2021, 03/2022, 10/2022, 03/2023). Missing prolia  in 09/2023 due to not follow up.         PLAN:           Osteoporosis:     1. Order labs   2. Recommend Prolia  60 mg SQ every 6 months on 03/29/2024, then 6 months in 09/2024   3. Recommend total calcium  (diet and supplement) 1200 mg daily  4. Recommend Vit D 50,000 iu weekly  5. Exercise      Recommend  as tolerated assuming activities can be performed            safely with low risk of fall and/or injury.  6. Fall Risk Reduction      Recommend self-guided home assessment for fall risk (elimination of clutter, cords, rugs, etc)      Recommend participation in piliates and/or t'ai chi as able  7. Stop smoking       Osteoarthritis/Joint pain     Recommend Tylenol  as needed         Follow 6 months           Prolia  injection    Consent: Written consent not obtained.  Risks and benefits: risks, benefits and alternatives were discussed  Consent given by: patient  Patient understanding: patient states understanding of the procedure being performed  Patient identity confirmed: verbally with patient  Sterile: ChloraPrep  Body area: left arm   Local anesthesia used: yes  Patient sedated: no  Preparation: patient was prepped and  draped in the usual sterile fasion  Needle gauge: 23  Meds administered: prolia    Patient tolerance: patient tolerated the procedure well with no immediate complications.  Comments: no

## 2024-09-27 ENCOUNTER — Ambulatory Visit (INDEPENDENT_AMBULATORY_CARE_PROVIDER_SITE_OTHER): Admitting: Internal Medicine
